# Patient Record
Sex: Male | Born: 1935 | Race: White | Hispanic: No | Marital: Married | Smoking: Never smoker
Health system: Southern US, Community
[De-identification: ages and names within clinical notes are randomized; demographics above are authoritative.]

## PROBLEM LIST (undated history)

## (undated) DIAGNOSIS — I6529 Occlusion and stenosis of unspecified carotid artery: Secondary | ICD-10-CM

## (undated) DIAGNOSIS — H903 Sensorineural hearing loss, bilateral: Secondary | ICD-10-CM

## (undated) DIAGNOSIS — I340 Nonrheumatic mitral (valve) insufficiency: Secondary | ICD-10-CM

## (undated) DIAGNOSIS — I2510 Atherosclerotic heart disease of native coronary artery without angina pectoris: Secondary | ICD-10-CM

## (undated) DIAGNOSIS — G3184 Mild cognitive impairment, so stated: Secondary | ICD-10-CM

## (undated) DIAGNOSIS — K219 Gastro-esophageal reflux disease without esophagitis: Secondary | ICD-10-CM

## (undated) DIAGNOSIS — E079 Disorder of thyroid, unspecified: Secondary | ICD-10-CM

## (undated) DIAGNOSIS — E785 Hyperlipidemia, unspecified: Secondary | ICD-10-CM

## (undated) DIAGNOSIS — N471 Phimosis: Secondary | ICD-10-CM

## (undated) DIAGNOSIS — E119 Type 2 diabetes mellitus without complications: Secondary | ICD-10-CM

## (undated) DIAGNOSIS — I774 Celiac artery compression syndrome: Secondary | ICD-10-CM

## (undated) DIAGNOSIS — K551 Chronic vascular disorders of intestine: Secondary | ICD-10-CM

## (undated) DIAGNOSIS — Z974 Presence of external hearing-aid: Secondary | ICD-10-CM

## (undated) DIAGNOSIS — I10 Essential (primary) hypertension: Secondary | ICD-10-CM

## (undated) DIAGNOSIS — E039 Hypothyroidism, unspecified: Secondary | ICD-10-CM

## (undated) DIAGNOSIS — I5189 Other ill-defined heart diseases: Secondary | ICD-10-CM

## (undated) DIAGNOSIS — I771 Stricture of artery: Secondary | ICD-10-CM

## (undated) DIAGNOSIS — M1990 Unspecified osteoarthritis, unspecified site: Secondary | ICD-10-CM

## (undated) DIAGNOSIS — R7303 Prediabetes: Secondary | ICD-10-CM

## (undated) DIAGNOSIS — R296 Repeated falls: Secondary | ICD-10-CM

## (undated) HISTORY — DX: Hyperlipidemia, unspecified: E78.5

## (undated) HISTORY — PX: CIRCUMCISION: SUR203

## (undated) HISTORY — DX: Unspecified osteoarthritis, unspecified site: M19.90

## (undated) HISTORY — PX: MRI: SHX5353

## (undated) HISTORY — PX: TONSILLECTOMY: SUR1361

---

## 1992-09-07 HISTORY — PX: OTHER SURGICAL HISTORY: SHX169

## 2002-02-16 DIAGNOSIS — I10 Essential (primary) hypertension: Secondary | ICD-10-CM | POA: Insufficient documentation

## 2004-09-07 DIAGNOSIS — Z86010 Personal history of colonic polyps: Secondary | ICD-10-CM | POA: Insufficient documentation

## 2004-09-17 ENCOUNTER — Ambulatory Visit: Payer: Self-pay

## 2009-11-14 ENCOUNTER — Ambulatory Visit: Payer: Self-pay | Admitting: Gastroenterology

## 2010-03-01 ENCOUNTER — Emergency Department: Payer: Self-pay | Admitting: Emergency Medicine

## 2010-03-03 ENCOUNTER — Other Ambulatory Visit: Payer: Self-pay | Admitting: Family Medicine

## 2010-03-04 ENCOUNTER — Ambulatory Visit: Payer: Self-pay | Admitting: Family Medicine

## 2010-03-12 ENCOUNTER — Ambulatory Visit: Payer: Self-pay | Admitting: Family Medicine

## 2011-07-24 LAB — BASIC METABOLIC PANEL
BUN: 16 mg/dL (ref 4–21)
Creatinine: 0.9 mg/dL (ref 0.6–1.3)
Glucose: 119 mg/dL
Potassium: 3.3 mmol/L — AB (ref 3.4–5.3)
SODIUM: 138 mmol/L (ref 137–147)

## 2011-11-09 LAB — LIPID PANEL
CHOLESTEROL: 160 mg/dL (ref 0–200)
HDL: 41 mg/dL (ref 35–70)
LDL CALC: 89 mg/dL
TRIGLYCERIDES: 151 mg/dL (ref 40–160)

## 2011-11-09 LAB — TSH: TSH: 2.99 u[IU]/mL (ref 0.41–5.90)

## 2011-11-09 LAB — HEPATIC FUNCTION PANEL
ALT: 38 U/L (ref 10–40)
AST: 30 U/L (ref 14–40)

## 2011-11-09 LAB — PSA: PSA: 1.5

## 2011-11-09 LAB — HEMOGLOBIN A1C: Hemoglobin A1C: 5.6

## 2013-01-19 DIAGNOSIS — E119 Type 2 diabetes mellitus without complications: Secondary | ICD-10-CM | POA: Insufficient documentation

## 2013-01-19 DIAGNOSIS — R7303 Prediabetes: Secondary | ICD-10-CM | POA: Insufficient documentation

## 2014-01-29 DIAGNOSIS — N471 Phimosis: Secondary | ICD-10-CM | POA: Insufficient documentation

## 2014-10-22 ENCOUNTER — Ambulatory Visit: Payer: Self-pay

## 2014-10-22 DIAGNOSIS — E118 Type 2 diabetes mellitus with unspecified complications: Secondary | ICD-10-CM | POA: Diagnosis not present

## 2014-10-22 DIAGNOSIS — J029 Acute pharyngitis, unspecified: Secondary | ICD-10-CM | POA: Diagnosis not present

## 2014-10-22 DIAGNOSIS — I10 Essential (primary) hypertension: Secondary | ICD-10-CM | POA: Diagnosis not present

## 2014-10-22 DIAGNOSIS — E039 Hypothyroidism, unspecified: Secondary | ICD-10-CM | POA: Diagnosis not present

## 2014-10-22 DIAGNOSIS — J329 Chronic sinusitis, unspecified: Secondary | ICD-10-CM | POA: Diagnosis not present

## 2014-10-22 DIAGNOSIS — J069 Acute upper respiratory infection, unspecified: Secondary | ICD-10-CM | POA: Diagnosis not present

## 2014-10-30 DIAGNOSIS — E039 Hypothyroidism, unspecified: Secondary | ICD-10-CM | POA: Diagnosis not present

## 2014-10-30 DIAGNOSIS — I10 Essential (primary) hypertension: Secondary | ICD-10-CM | POA: Diagnosis not present

## 2014-10-30 DIAGNOSIS — S29012A Strain of muscle and tendon of back wall of thorax, initial encounter: Secondary | ICD-10-CM | POA: Diagnosis not present

## 2014-10-30 DIAGNOSIS — E876 Hypokalemia: Secondary | ICD-10-CM | POA: Diagnosis not present

## 2014-11-27 DIAGNOSIS — E039 Hypothyroidism, unspecified: Secondary | ICD-10-CM | POA: Diagnosis not present

## 2014-11-27 DIAGNOSIS — N401 Enlarged prostate with lower urinary tract symptoms: Secondary | ICD-10-CM | POA: Diagnosis not present

## 2014-11-27 DIAGNOSIS — I10 Essential (primary) hypertension: Secondary | ICD-10-CM | POA: Diagnosis not present

## 2015-05-06 DIAGNOSIS — L237 Allergic contact dermatitis due to plants, except food: Secondary | ICD-10-CM | POA: Diagnosis not present

## 2015-05-06 DIAGNOSIS — B356 Tinea cruris: Secondary | ICD-10-CM | POA: Diagnosis not present

## 2015-06-22 ENCOUNTER — Ambulatory Visit
Admission: EM | Admit: 2015-06-22 | Discharge: 2015-06-22 | Disposition: A | Discharge status: Home / Self Care | Payer: Medicare Other | Attending: Family Medicine | Admitting: Family Medicine

## 2015-06-22 ENCOUNTER — Encounter: Payer: Self-pay | Admitting: Gynecology

## 2015-06-22 DIAGNOSIS — N410 Acute prostatitis: Secondary | ICD-10-CM | POA: Insufficient documentation

## 2015-06-22 DIAGNOSIS — E119 Type 2 diabetes mellitus without complications: Secondary | ICD-10-CM | POA: Insufficient documentation

## 2015-06-22 DIAGNOSIS — K219 Gastro-esophageal reflux disease without esophagitis: Secondary | ICD-10-CM | POA: Diagnosis not present

## 2015-06-22 DIAGNOSIS — Z79899 Other long term (current) drug therapy: Secondary | ICD-10-CM | POA: Insufficient documentation

## 2015-06-22 DIAGNOSIS — E079 Disorder of thyroid, unspecified: Secondary | ICD-10-CM | POA: Insufficient documentation

## 2015-06-22 DIAGNOSIS — R109 Unspecified abdominal pain: Secondary | ICD-10-CM | POA: Diagnosis present

## 2015-06-22 DIAGNOSIS — N471 Phimosis: Secondary | ICD-10-CM | POA: Insufficient documentation

## 2015-06-22 DIAGNOSIS — I10 Essential (primary) hypertension: Secondary | ICD-10-CM | POA: Insufficient documentation

## 2015-06-22 HISTORY — DX: Type 2 diabetes mellitus without complications: E11.9

## 2015-06-22 HISTORY — DX: Phimosis: N47.1

## 2015-06-22 HISTORY — DX: Gastro-esophageal reflux disease without esophagitis: K21.9

## 2015-06-22 HISTORY — DX: Disorder of thyroid, unspecified: E07.9

## 2015-06-22 HISTORY — DX: Essential (primary) hypertension: I10

## 2015-06-22 LAB — URINALYSIS COMPLETE WITH MICROSCOPIC (ARMC ONLY)
Bilirubin Urine: NEGATIVE
GLUCOSE, UA: NEGATIVE mg/dL
KETONES UR: NEGATIVE mg/dL
Leukocytes, UA: NEGATIVE
NITRITE: NEGATIVE
Protein, ur: NEGATIVE mg/dL
Specific Gravity, Urine: 1.02 (ref 1.005–1.030)
Squamous Epithelial / LPF: NONE SEEN — AB
pH: 6 (ref 5.0–8.0)

## 2015-06-22 MED ORDER — LEVOFLOXACIN 500 MG PO TABS: 500.0000 mg | ORAL_TABLET | Freq: Every day | ORAL | Status: DC

## 2015-06-22 NOTE — Discharge Instructions (Signed)

## 2015-06-22 NOTE — ED Notes (Signed)
Patient c/o question UTI or prostate infection. Patient stated abdominal pain x this am.

## 2015-06-22 NOTE — ED Provider Notes (Signed)
CSN: [id number]     Arrival date & time 06/22/15  1457 History   First MD Initiated Contact with Patient 06/22/15 1611     Chief Complaint  Patient presents with  . Abdominal Pain   (Consider location/radiation/quality/duration/timing/severity/associated sxs/prior Treatment) HPI Comments: 79 yo male presents with a c/o lower abdominal/pelvic pain intermittently since this morning. States in the past has had UTIs or prostatitis with similar presentation symptoms. Denies any fevers, chills, vomiting, diarrhea, dysuria, melena, hematochezia, hematuria.   The history is provided by the patient.    Past Medical History  Diagnosis Date  . Hypertension   . Diabetes mellitus without complication ([place])   . Thyroid disease   . GERD (gastroesophageal reflux disease)   . Phimosis    History reviewed. No pertinent past surgical history. No family history on file. Social History  Substance Use Topics  . Smoking status: Never Smoker   . Smokeless tobacco: Never Used  . Alcohol Use: No    Review of Systems  Allergies  Review of patient's allergies indicates no known allergies.  Home Medications   Prior to Admission medications   Medication Sig Start Date End Date Taking? Authorizing Provider  cetirizine (ZYRTEC) 10 MG tablet Take 10 mg by mouth daily.   Yes Historical Provider, MD  clotrimazole-betamethasone (LOTRISONE) cream Apply 1 application topically 2 (two) times daily.   Yes Historical Provider, MD  levothyroxine (SYNTHROID, LEVOTHROID) 75 MCG tablet Take 75 mcg by mouth daily before breakfast.   Yes Historical Provider, MD  losartan (COZAAR) 25 MG tablet Take 25 mg by mouth daily.   Yes Historical Provider, MD  omeprazole (PRILOSEC) 20 MG capsule Take 20 mg by mouth daily.   Yes Historical Provider, MD  tamsulosin (FLOMAX) 0.4 MG CAPS capsule Take 0.4 mg by mouth.   Yes Historical Provider, MD  triamterene-hydrochlorothiazide (MAXZIDE) 75-50 MG tablet Take 1 tablet by mouth  daily.   Yes Historical Provider, MD  levofloxacin (LEVAQUIN) 500 MG tablet Take 1 tablet (500 mg total) by mouth daily. 06/22/15   Norval Gable, MD   Meds Ordered and Administered this Visit  Medications - No data to display  BP 162/78 mmHg  Pulse 60  Temp(Src) 96.4 F (35.8 C) (Tympanic)  Resp 18  Ht 5\' 9"  (1.753 m)  Wt 200 lb (90.719 kg)  BMI 29.52 kg/m2  SpO2 99% No data found.   Physical Exam  Constitutional: He appears well-developed and well-nourished. No distress.  Cardiovascular: Normal rate.   Pulmonary/Chest: Effort normal and breath sounds normal. No respiratory distress.  Abdominal: Soft. Bowel sounds are normal. He exhibits no distension and no mass. There is no tenderness. There is no rebound and no guarding. Hernia confirmed negative in the right inguinal area and confirmed negative in the left inguinal area.  Genitourinary: Prostate is enlarged and tender.  Lymphadenopathy:       Right: No inguinal adenopathy present.       Left: No inguinal adenopathy present.  Skin: He is not diaphoretic.  Nursing note and vitals reviewed.   ED Course  Procedures (including critical care time)  Labs Review Labs Reviewed  URINALYSIS COMPLETEWITH MICROSCOPIC ([place]) - Abnormal; Notable for the following:    Hgb urine dipstick 2+ (*)    Squamous Epithelial / LPF NONE SEEN (*)    All other components within normal limits  URINE CULTURE    Imaging Review No results found.   Visual Acuity Review  Right Eye Distance:   Left Eye  Distance:   Bilateral Distance:    Right Eye Near:   Left Eye Near:    Bilateral Near:         MDM   1. Acute prostatitis    Discharge Medication List as of 06/22/2015  5:12 PM    START taking these medications   Details  levofloxacin (LEVAQUIN) 500 MG tablet Take 1 tablet (500 mg total) by mouth daily., Starting 06/22/2015, Until Discontinued, Print      1. Lab results (negative UA) and diagnosis reviewed with  patient 2. rx as per orders above; reviewed possible side effects, interactions, risks and benefits  3. Recommend supportive treatment with otc analgesics prn  4. Follow-up prn if symptoms worsen or don't improve    Norval Gable, MD 06/28/15 1411

## 2015-06-24 LAB — URINE CULTURE: CULTURE: NO GROWTH

## 2015-06-26 DIAGNOSIS — N401 Enlarged prostate with lower urinary tract symptoms: Secondary | ICD-10-CM | POA: Diagnosis not present

## 2015-06-26 DIAGNOSIS — Z23 Encounter for immunization: Secondary | ICD-10-CM | POA: Diagnosis not present

## 2015-06-26 DIAGNOSIS — E876 Hypokalemia: Secondary | ICD-10-CM | POA: Diagnosis not present

## 2015-06-26 DIAGNOSIS — R739 Hyperglycemia, unspecified: Secondary | ICD-10-CM | POA: Diagnosis not present

## 2015-06-26 DIAGNOSIS — E039 Hypothyroidism, unspecified: Secondary | ICD-10-CM | POA: Diagnosis not present

## 2015-06-26 DIAGNOSIS — R319 Hematuria, unspecified: Secondary | ICD-10-CM | POA: Diagnosis not present

## 2015-06-27 DIAGNOSIS — Z23 Encounter for immunization: Secondary | ICD-10-CM | POA: Diagnosis not present

## 2015-09-03 DIAGNOSIS — H8110 Benign paroxysmal vertigo, unspecified ear: Secondary | ICD-10-CM | POA: Diagnosis not present

## 2015-09-03 DIAGNOSIS — N410 Acute prostatitis: Secondary | ICD-10-CM | POA: Diagnosis not present

## 2015-09-11 DIAGNOSIS — H2513 Age-related nuclear cataract, bilateral: Secondary | ICD-10-CM | POA: Diagnosis not present

## 2015-09-23 DIAGNOSIS — L821 Other seborrheic keratosis: Secondary | ICD-10-CM | POA: Diagnosis not present

## 2015-11-19 ENCOUNTER — Telehealth: Payer: Self-pay | Admitting: Family Medicine

## 2015-11-20 ENCOUNTER — Emergency Department
Admission: EM | Admit: 2015-11-20 | Discharge: 2015-11-20 | Disposition: A | Discharge status: Home / Self Care | Payer: Medicare Other | Attending: Emergency Medicine | Admitting: Emergency Medicine

## 2015-11-20 ENCOUNTER — Emergency Department: Payer: Medicare Other

## 2015-11-20 DIAGNOSIS — Z7982 Long term (current) use of aspirin: Secondary | ICD-10-CM | POA: Diagnosis not present

## 2015-11-20 DIAGNOSIS — Z79899 Other long term (current) drug therapy: Secondary | ICD-10-CM | POA: Insufficient documentation

## 2015-11-20 DIAGNOSIS — R945 Abnormal results of liver function studies: Secondary | ICD-10-CM | POA: Insufficient documentation

## 2015-11-20 DIAGNOSIS — E119 Type 2 diabetes mellitus without complications: Secondary | ICD-10-CM | POA: Diagnosis not present

## 2015-11-20 DIAGNOSIS — Z792 Long term (current) use of antibiotics: Secondary | ICD-10-CM | POA: Diagnosis not present

## 2015-11-20 DIAGNOSIS — J159 Unspecified bacterial pneumonia: Secondary | ICD-10-CM | POA: Diagnosis not present

## 2015-11-20 DIAGNOSIS — J189 Pneumonia, unspecified organism: Secondary | ICD-10-CM

## 2015-11-20 DIAGNOSIS — I10 Essential (primary) hypertension: Secondary | ICD-10-CM | POA: Diagnosis not present

## 2015-11-20 DIAGNOSIS — R079 Chest pain, unspecified: Secondary | ICD-10-CM | POA: Diagnosis not present

## 2015-11-20 DIAGNOSIS — R0602 Shortness of breath: Secondary | ICD-10-CM | POA: Diagnosis not present

## 2015-11-20 DIAGNOSIS — R200 Anesthesia of skin: Secondary | ICD-10-CM | POA: Insufficient documentation

## 2015-11-20 DIAGNOSIS — R209 Unspecified disturbances of skin sensation: Secondary | ICD-10-CM | POA: Diagnosis not present

## 2015-11-20 DIAGNOSIS — R0789 Other chest pain: Secondary | ICD-10-CM | POA: Diagnosis not present

## 2015-11-20 LAB — TROPONIN I

## 2015-11-20 LAB — BASIC METABOLIC PANEL
ANION GAP: 7 (ref 5–15)
BUN: 15 mg/dL (ref 6–20)
CO2: 27 mmol/L (ref 22–32)
Calcium: 9.1 mg/dL (ref 8.9–10.3)
Chloride: 101 mmol/L (ref 101–111)
Creatinine, Ser: 0.9 mg/dL (ref 0.61–1.24)
GLUCOSE: 110 mg/dL — AB (ref 65–99)
POTASSIUM: 3.3 mmol/L — AB (ref 3.5–5.1)
Sodium: 135 mmol/L (ref 135–145)

## 2015-11-20 LAB — CBC
HEMATOCRIT: 44.4 % (ref 40.0–52.0)
HEMOGLOBIN: 15.1 g/dL (ref 13.0–18.0)
MCH: 30.1 pg (ref 26.0–34.0)
MCHC: 34 g/dL (ref 32.0–36.0)
MCV: 88.4 fL (ref 80.0–100.0)
Platelets: 272 10*3/uL (ref 150–440)
RBC: 5.03 MIL/uL (ref 4.40–5.90)
RDW: 13.6 % (ref 11.5–14.5)
WBC: 7.9 10*3/uL (ref 3.8–10.6)

## 2015-11-20 MED ORDER — LEVOFLOXACIN 750 MG PO TABS: 750.0000 mg | ORAL_TABLET | Freq: Every day | ORAL | Status: AC

## 2015-11-20 NOTE — ED Notes (Signed)
Pt back from CT. Up to bedside commode to urinate.

## 2015-11-20 NOTE — ED Notes (Signed)
Patient transported to CT 

## 2015-11-20 NOTE — ED Provider Notes (Signed)
[facility] Emergency Department Provider Note    ____________________________________________  Time seen: ~1240  I have reviewed the triage vital signs and the nursing notes.   HISTORY  Chief Complaint Chest Pain   History limited by: Not Limited   HPI Ernest Herschell Jamarquis Rommel. is a 80 y.o. male who presents to the emergency department today because of concern for chest pain. Patient states that it started this morning around 530am. He describes it as being located centrally. He describes it as being pressure like. He did have some associated left arm/hand tingling. He denies any associated shortness of breath or diaphoresis. The patient had a similar episode of pain about a week and a half ago while at church. He did not have the numbness with the pain at that time but did have a little difficulty with speech. No recent fevers. No recent nausea or vomiting.   Past Medical History  Diagnosis Date  . Hypertension   . Diabetes mellitus without complication ([place])   . Thyroid disease   . GERD (gastroesophageal reflux disease)   . Phimosis     Patient Active Problem List   Diagnosis Date Noted  . Abnormal results of liver function studies 11/20/2015  . Abnormal liver enzymes 03/24/2009  . Pure hyperglyceridemia 03/20/2009  . Hypothyroidism 09/18/2008  . History of colon polyps 09/07/2004  . Benign essential HTN 02/16/2002  . Esophagitis, reflux 02/16/2002    Past Surgical History  Procedure Laterality Date  . Sinus surgery  1994    on left side    Current Outpatient Rx  Name  Route  Sig  Dispense  Refill  . aspirin 81 MG tablet   Oral   Take 1 tablet by mouth daily.         Marland Kitchen levothyroxine (SYNTHROID, LEVOTHROID) 75 MCG tablet   Oral   Take 75 mcg by mouth daily before breakfast.         . losartan (COZAAR) 25 MG tablet   Oral   Take 25 mg by mouth daily.         Marland Kitchen omeprazole (PRILOSEC) 20 MG capsule   Oral   Take 20 mg by  mouth daily.         . tamsulosin (FLOMAX) 0.4 MG CAPS capsule   Oral   Take 0.4 mg by mouth.         . triamterene-hydrochlorothiazide (MAXZIDE) 75-50 MG tablet   Oral   Take 1 tablet by mouth daily.         . cetirizine (ZYRTEC) 10 MG tablet   Oral   Take 10 mg by mouth daily.         . clotrimazole-betamethasone (LOTRISONE) cream   Topical   Apply 1 application topically 2 (two) times daily.         Marland Kitchen levofloxacin (LEVAQUIN) 500 MG tablet   Oral   Take 1 tablet (500 mg total) by mouth daily.   14 tablet   0   . Multiple Vitamins-Minerals (MULTIVITAMIN ADULT PO)   Oral   Take 1 tablet by mouth daily.           Allergies Review of patient's allergies indicates no known allergies.  Family History  Problem Relation Age of Onset  . Alzheimer's disease Mother   . CAD Father   . Diabetes Sister     type 2    Social History Social History  Substance Use Topics  . Smoking status: Never Smoker   .  Smokeless tobacco: Never Used  . Alcohol Use: No    Review of Systems  Constitutional: Negative for fever. Cardiovascular: Positive for chest pain. Respiratory: Negative for shortness of breath. Gastrointestinal: Negative for abdominal pain, vomiting and diarrhea. Neurological: Negative for headaches, focal weakness or numbness.  10-point ROS otherwise negative.  ____________________________________________   PHYSICAL EXAM:  VITAL SIGNS: ED Triage Vitals  Enc Vitals Group     BP 11/20/15 1202 166/77 mmHg     Pulse Rate 11/20/15 1202 51     Resp 11/20/15 1202 16     Temp 11/20/15 1202 98.1 F (36.7 C)     Temp Source 11/20/15 1202 Oral     SpO2 11/20/15 1202 97 %     Weight 11/20/15 1202 200 lb (90.719 kg)     Height 11/20/15 1202 5\' 10"  (1.778 m)     Head Cir --      Peak Flow --      Pain Score 11/20/15 1159 5   Constitutional: Alert and oriented. Well appearing and in no distress. Eyes: Conjunctivae are normal. PERRL. Normal  extraocular movements. ENT   Head: Normocephalic and atraumatic.   Nose: No congestion/rhinnorhea.   Mouth/Throat: Mucous membranes are moist.   Neck: No stridor. Hematological/Lymphatic/Immunilogical: No cervical lymphadenopathy. Cardiovascular: Normal rate, regular rhythm.  No murmurs, rubs, or gallops. Respiratory: Normal respiratory effort without tachypnea nor retractions. Breath sounds are clear and equal bilaterally. No wheezes/rales/rhonchi. Gastrointestinal: Soft and nontender. No distention. There is no CVA tenderness. Genitourinary: Deferred Musculoskeletal: Normal range of motion in all extremities. No joint effusions.  No lower extremity tenderness nor edema. Neurologic:  Normal speech and language. No gross focal neurologic deficits are appreciated.  Skin:  Skin is warm, dry and intact. No rash noted. Psychiatric: Mood and affect are normal. Speech and behavior are normal. Patient exhibits appropriate insight and judgment.  ____________________________________________    LABS (pertinent positives/negatives)  Labs Reviewed  BASIC METABOLIC PANEL - Abnormal; Notable for the following:    Potassium 3.3 (*)    Glucose, Bld 110 (*)    All other components within normal limits  CBC  TROPONIN I  TROPONIN I     ____________________________________________   EKG  I, Nance Pear, attending physician, personally viewed and interpreted this EKG  EKG Time: 1200 Rate: 56 Rhythm: sinus bradycardia Axis: left axis deviation Intervals: qtc 417 QRS: narrow, q waves V1 ST changes: no st elevation Impression: abnormal ekg  ____________________________________________    RADIOLOGY  CXR IMPRESSION: Lingular airspace disease which may reflect atelectasis versus Pneumonia.  CT head IMPRESSION: No acute abnormality. ____________________________________________   PROCEDURES  Procedure(s) performed: None  Critical Care performed:  No  ____________________________________________   INITIAL IMPRESSION / ASSESSMENT AND PLAN / ED COURSE  Pertinent labs & imaging results that were available during my care of the patient were reviewed by me and considered in my medical decision making (see chart for details).  Patient presented to the emergency department today because of concerns for chest pain. Patient did have 2 sets of troponin which were both negative. In addition chest x-ray showed possible pneumonia. Furthermore patient did get a head CT which was negative. This point will treat for pneumonia. Will have patient follow-up with primary care.  ____________________________________________   FINAL CLINICAL IMPRESSION(S) / ED DIAGNOSES  Final diagnoses:  None     Nance Pear, MD 11/20/15 [phone]

## 2015-11-20 NOTE — ED Notes (Addendum)
PT arrives to ER c/o chest pressure and numbness to left arm upon awakening this AM. Pt state that similar episode happened X 1.5 weeks ago but also had a hard time getting words out. Pt does not have any trouble speaking in this event. Hypertensive with EMS. No motor deficits present at this time. Pt alert and oriented X4, active, cooperative, pt in NAD. RR even and unlabored, color WNL.  Pt reports "slight" numbness to left arm at this time, full ROM.

## 2015-11-20 NOTE — ED Notes (Signed)
Patient transported to X-ray 

## 2015-11-20 NOTE — Discharge Instructions (Signed)
Please seek medical attention for any high fevers, chest pain, shortness of breath, change in behavior, persistent vomiting, bloody stool or any other new or concerning symptoms. ° °Community-Acquired Pneumonia, Adult °Pneumonia is an infection of the lungs. There are different types of pneumonia. One type can develop while a person is in a hospital. A different type, called community-acquired pneumonia, develops in people who are not, or have not recently been, in the hospital or other health care facility.  °CAUSES °Pneumonia may be caused by bacteria, viruses, or funguses. Community-acquired pneumonia is often caused by Streptococcus pneumonia bacteria. These bacteria are often passed from one person to another by breathing in droplets from the cough or sneeze of an infected person. °RISK FACTORS °The condition is more likely to develop in: °· People who have chronic diseases, such as chronic obstructive pulmonary disease (COPD), asthma, congestive heart failure, cystic fibrosis, diabetes, or kidney disease. °· People who have early-stage or late-stage HIV. °· People who have sickle cell disease. °· People who have had their spleen removed (splenectomy). °· People who have poor dental hygiene. °· People who have medical conditions that increase the risk of breathing in (aspirating) secretions their own mouth and nose.   °· People who have a weakened immune system (immunocompromised). °· People who smoke. °· People who travel to areas where pneumonia-causing germs commonly exist. °· People who are around animal habitats or animals that have pneumonia-causing germs, including birds, bats, rabbits, cats, and farm animals. °SYMPTOMS °Symptoms of this condition include: °· A dry cough. °· A wet (productive) cough. °· Fever. °· Sweating. °· Chest pain, especially when breathing deeply or coughing. °· Rapid breathing or difficulty breathing. °· Shortness of breath. °· Shaking chills. °· Fatigue. °· Muscle  aches. °DIAGNOSIS °Your health care provider will take a medical history and perform a physical exam. You may also have other tests, including: °· Imaging studies of your chest, including X-rays. °· Tests to check your blood oxygen level and other blood gases. °· Other tests on blood, mucus (sputum), fluid around your lungs (pleural fluid), and urine. °If your pneumonia is severe, other tests may be done to identify the specific cause of your illness. °TREATMENT °The type of treatment that you receive depends on many factors, such as the cause of your pneumonia, the medicines you take, and other medical conditions that you have. For most adults, treatment and recovery from pneumonia may occur at home. In some cases, treatment must happen in a hospital. Treatment may include: °· Antibiotic medicines, if the pneumonia was caused by bacteria. °· Antiviral medicines, if the pneumonia was caused by a virus. °· Medicines that are given by mouth or through an IV tube. °· Oxygen. °· Respiratory therapy. °Although rare, treating severe pneumonia may include: °· Mechanical ventilation. This is done if you are not breathing well on your own and you cannot maintain a safe blood oxygen level. °· Thoracentesis. This procedure removes fluid around one lung or both lungs to help you breathe better. °HOME CARE INSTRUCTIONS °· Take over-the-counter and prescription medicines only as told by your health care provider. °¨ Only take cough medicine if you are losing sleep. Understand that cough medicine can prevent your body's natural ability to remove mucus from your lungs. °¨ If you were prescribed an antibiotic medicine, take it as told by your health care provider. Do not stop taking the antibiotic even if you start to feel better. °· Sleep in a semi-upright position at night. Try sleeping in a reclining chair, or place a few pillows under your head. °· Do   not use tobacco products, including cigarettes, chewing tobacco, and  e-cigarettes. If you need help quitting, ask your health care provider. °· Drink enough water to keep your urine clear or pale yellow. This will help to thin out mucus secretions in your lungs. °PREVENTION °There are ways that you can decrease your risk of developing community-acquired pneumonia. Consider getting a pneumococcal vaccine if: °· You are older than 78 years of age. °· You are older than 78 years of age and are undergoing cancer treatment, have chronic lung disease, or have other medical conditions that affect your immune system. Ask your health care provider if this applies to you. °There are different types and schedules of pneumococcal vaccines. Ask your health care provider which vaccination option is best for you. °You may also prevent community-acquired pneumonia if you take these actions: °· Get an influenza vaccine every year. Ask your health care provider which type of influenza vaccine is best for you. °· Go to the dentist on a regular basis. °· Wash your hands often. Use hand sanitizer if soap and water are not available. °SEEK MEDICAL CARE IF: °· You have a fever. °· You are losing sleep because you cannot control your cough with cough medicine. °SEEK IMMEDIATE MEDICAL CARE IF: °· You have worsening shortness of breath. °· You have increased chest pain. °· Your sickness becomes worse, especially if you are an older adult or have a weakened immune system. °· You cough up blood. °  °This information is not intended to replace advice given to you by your health care provider. Make sure you discuss any questions you have with your health care provider. °  °Document Released: 08/24/2005 Document Revised: 05/15/2015 Document Reviewed: 12/19/2014 °Elsevier Interactive Patient Education ©2016 [employer] ° °

## 2015-11-20 NOTE — ED Notes (Signed)
Pt up to bathroom.

## 2015-11-20 NOTE — ED Notes (Signed)
Pt alert and oriented X4, active, cooperative, pt in NAD. RR even and unlabored, color WNL.  Pt informed to return if any life threatening symptoms occur.   

## 2015-11-20 NOTE — ED Notes (Signed)
Pt reports that he took 2 324 ASA PTA

## 2015-11-21 ENCOUNTER — Ambulatory Visit: Payer: Self-pay | Admitting: Family Medicine

## 2015-11-22 ENCOUNTER — Ambulatory Visit (INDEPENDENT_AMBULATORY_CARE_PROVIDER_SITE_OTHER): Payer: Medicare Other | Admitting: Family Medicine

## 2015-11-22 ENCOUNTER — Encounter: Payer: Self-pay | Admitting: Family Medicine

## 2015-11-22 VITALS — BP 120/64 | HR 70 | Temp 97.8°F | Resp 16 | Ht 68.5 in | Wt 204.0 lb

## 2015-11-22 DIAGNOSIS — I10 Essential (primary) hypertension: Secondary | ICD-10-CM

## 2015-11-22 DIAGNOSIS — R079 Chest pain, unspecified: Secondary | ICD-10-CM | POA: Diagnosis not present

## 2015-11-22 DIAGNOSIS — E781 Pure hyperglyceridemia: Secondary | ICD-10-CM

## 2015-11-22 DIAGNOSIS — R739 Hyperglycemia, unspecified: Secondary | ICD-10-CM

## 2015-11-22 DIAGNOSIS — Z125 Encounter for screening for malignant neoplasm of prostate: Secondary | ICD-10-CM | POA: Diagnosis not present

## 2015-11-22 DIAGNOSIS — G459 Transient cerebral ischemic attack, unspecified: Secondary | ICD-10-CM

## 2015-11-22 DIAGNOSIS — Z8249 Family history of ischemic heart disease and other diseases of the circulatory system: Secondary | ICD-10-CM | POA: Insufficient documentation

## 2015-11-22 DIAGNOSIS — E039 Hypothyroidism, unspecified: Secondary | ICD-10-CM

## 2015-11-22 DIAGNOSIS — Z0000 Encounter for general adult medical examination without abnormal findings: Secondary | ICD-10-CM | POA: Diagnosis not present

## 2015-11-22 DIAGNOSIS — J302 Other seasonal allergic rhinitis: Secondary | ICD-10-CM | POA: Insufficient documentation

## 2015-11-22 NOTE — Progress Notes (Signed)
Patient: Ernest James., Male    DOB: 24-Jan-1936, 80 y.o.   MRN: [id number] Visit Date: 11/22/2015  Today's Provider: Lelon Huh, MD   Chief Complaint  Patient presents with  . Medicare Wellness  . Hypertension  . Hyperlipidemia  . Hypothyroidism   Subjective:    Annual wellness visit [facility] [name]. is a 80 y.o. male. He feels well. He reports exercising yes/ some walking. He reports he is sleeping fairly well.  -----------------------------------------------------------  Establish care  He is transferring from [facility] where he was followed for  Follow-up for hypothyroidism and abnormal liver enzymesfrom 11/09/2011; labs checked, no changes were made.     Hypertension, follow-up:  BP Readings from Last 3 Encounters:  11/22/15 120/64  11/20/15 175/70  11/09/11 138/72    He was last seen for hypertension 11/09/2011.  BP at that visit was 138/72. Management since that visit includes; no changes.He reports good compliance with treatment. He is not having side effects. none  He is exercising. He is adherent to low salt diet.   Outside blood pressures are no. He is experiencing chest pain.  Patient denies none.   Cardiovascular risk factors include none.  Use of agents associated with hypertension: none.   ----------------------------------------------------------------------    Lipid/Cholesterol, Follow-up:   Last seen for this 11/09/2011.  Management since that visit includes; labs checked, no changes.  Last Lipid Panel:    Component Value Date/Time   CHOL 160 11/09/2011   TRIG 151 11/09/2011   HDL 41 11/09/2011   LDLCALC 89 11/09/2011    He reports good compliance with treatment. He is not having side effects. none  Wt Readings from Last 3 Encounters:  11/22/15 204 lb (92.534 kg)  11/20/15 200 lb (90.719 kg)  11/09/11 196 lb (88.905 kg)     ----------------------------------------------------------------------  Aphasia He described episode at church about 2 weeks ago where he had sudden onset of speech difficulty which he described as having difficulty formulating spoken words. It last about 10 minutes and resolved spontaneously.  Chest pain He presented to ER earlier this week with vague non-exertional chest pain associated with numbness in his left arm. Cardiac enzymes were repeatedly normal, and EKG was normal, but his had of possible pneumonia on xray and was treated with Levaquin. He has had no more episodes of chest pain or arm numbness since then.   Review of Systems  Constitutional: Negative.   HENT: Negative.   Eyes: Negative.   Respiratory: Negative.   Cardiovascular: Positive for chest pain.       Patient went to ER 11/20/2015 with chest pain and elevated Bp  Gastrointestinal: Negative.   Endocrine: Negative.   Genitourinary: Negative.   Musculoskeletal: Negative.   Skin: Negative.   Allergic/Immunologic: Negative.   Neurological: Negative.   Hematological: Negative.   Psychiatric/Behavioral: Negative.     Social History   Social History  . Marital Status: Married    Spouse Name: N/A  . Number of Children: N/A  . Years of Education: N/A   Occupational History  . [employer]    Social History Main Topics  . Smoking status: Never Smoker   . Smokeless tobacco: Never Used  . Alcohol Use: No  . Drug Use: No  . Sexual Activity: Not on file   Other Topics Concern  . Not on file   Social History Narrative    Past Medical History  Diagnosis Date  . Hypertension   .  Diabetes mellitus without complication ([place])   . Thyroid disease   . GERD (gastroesophageal reflux disease)   . Phimosis      Patient Active Problem List   Diagnosis Date Noted  . Allergic rhinitis, seasonal 11/22/2015  . Abnormal results of liver function studies 11/20/2015  . Phimosis 01/29/2014  . Hyperglycemia 01/19/2013   . Abnormal liver enzymes 03/24/2009  . Pure hyperglyceridemia 03/20/2009  . Hypothyroidism 09/18/2008  . History of colon polyps 09/07/2004  . Benign essential HTN 02/16/2002  . Esophagitis, reflux 02/16/2002    Past Surgical History  Procedure Laterality Date  . Sinus surgery  1994    on left side    His family history includes Alzheimer's disease in his mother; CAD in his father; Diabetes in his sister.    Previous Medications   ASPIRIN EC 81 MG TABLET    Take 81 mg by mouth daily.   CETIRIZINE (ZYRTEC) 10 MG TABLET    Take 10 mg by mouth daily.   CLOTRIMAZOLE-BETAMETHASONE (LOTRISONE) CREAM    Apply 1 application topically 2 (two) times daily.   LEVOFLOXACIN (LEVAQUIN) 750 MG TABLET    Take 1 tablet (750 mg total) by mouth daily.   LEVOTHYROXINE (SYNTHROID, LEVOTHROID) 75 MCG TABLET    Take 75 mcg by mouth daily before breakfast.   LOSARTAN (COZAAR) 25 MG TABLET    Take 25 mg by mouth daily.   MULTIPLE VITAMINS-MINERALS (MULTIVITAMIN ADULT PO)    Take 1 tablet by mouth daily.   OMEPRAZOLE (PRILOSEC) 20 MG CAPSULE    Take 20 mg by mouth daily.   TAMSULOSIN (FLOMAX) 0.4 MG CAPS CAPSULE    Take 0.4 mg by mouth daily after supper.    TRIAMTERENE-HYDROCHLOROTHIAZIDE (MAXZIDE) 75-50 MG TABLET    Take 1 tablet by mouth daily.    Patient Care Team: No Pcp Per Patient as PCP - General (General Practice)     Objective:   Vitals: BP 120/64 mmHg  Pulse 70  Temp(Src) 97.8 F (36.6 C) (Oral)  Resp 16  Ht 5' 8.5" (1.74 m)  Wt 204 lb (92.534 kg)  BMI 30.56 kg/m2  SpO2 96%  Physical Exam  Activities of Daily Living In your present state of health, do you have any difficulty performing the following activities: 11/22/2015  Hearing? N  Vision? N  Difficulty concentrating or making decisions? N  Walking or climbing stairs? N  Dressing or bathing? N  Doing errands, shopping? N    Fall Risk Assessment Fall Risk  11/22/2015  Falls in the past year? No     Depression  Screen PHQ 2/9 Scores 11/22/2015  PHQ - 2 Score 0  PHQ- 9 Score 1    Cognitive Testing - 6-CIT  Correct? Score   What year is it? yes 0 0 or 4  What month is it? yes 0 0 or 3  Memorize:    Ernest James,  44,  [place],      What time is it? (within 1 hour) yes 0 0 or 3  Count backwards from 20 yes 0 0, 2, or 4  Name the months of the year yes 0 0, 2, or 4  Repeat name & address above yes 0 0, 2, 4, 6, 8, or 10       TOTAL SCORE  0/28   Interpretation:  Normal  Normal (0-7) Abnormal (8-28)    Audit-C Alcohol Use Screening  Question Answer Points  How often do you have alcoholic  drink? never 0  On days you do drink alcohol, how many drinks do you typically consume? n/a 0  How oftey will you drink 6 or more in a total? never 0  Total Score:  0   A score of 3 or more in women, and 4 or more in men indicates increased risk for alcohol abuse, EXCEPT if all of the points are from question 1.    Assessment & Plan:     Annual Wellness Visit  Reviewed patient's Family Medical History Reviewed and updated list of patient's medical providers Assessment of cognitive impairment was done Assessed patient's functional ability Established a written schedule for health screening Star Completed and Reviewed  Exercise Activities and Dietary recommendations Goals    None      Immunization History  Administered Date(s) Administered  . Influenza, High Dose Seasonal PF 06/27/2015  . Pneumococcal Conjugate-13 06/27/2015  . Pneumococcal Polysaccharide-23 06/30/2007  . Td 06/07/2001  . Tdap 06/18/2014  . Zoster 02/16/2012    Health Maintenance  Topic Date Due  . PNA vac Low Risk Adult (2 of 2 - PCV13) 06/29/2008  . TETANUS/TDAP  06/08/2011  . INFLUENZA VACCINE  04/08/2015  . ZOSTAVAX  Completed      Discussed health benefits of physical activity, and encouraged him to engage in regular exercise appropriate for his age and condition.     --------------------------------------------------------------------------------   1. Medicare annual wellness visit, subsequent   2. Benign essential HTN Well controlled.  Continue current medications.    3. Hyperglycemia  - Hemoglobin A1c  4. Hypothyroidism, unspecified hypothyroidism type Check TSH  5. Pure hyperglyceridemia Diet controlled.  - Lipid panel  6. Transient cerebral ischemia, unspecified transient cerebral ischemia type Episode of speech impairment.  - US Carotid Duplex Bilateral; Future  7. Chest pain, unspecified chest pain type Has family history of CAD but has never had stress test.  - Myocardial Perfusion Imaging; Future  8. Family history of early CAD He is to stay on 81mg  ECASA daily which had stopped until the last few days.   9. Prostate cancer screening  - PSA

## 2015-11-22 NOTE — Patient Instructions (Signed)
Please contact the GI department at [facility] for follow up colonoscopy

## 2015-11-23 ENCOUNTER — Encounter: Payer: Self-pay | Admitting: Family Medicine

## 2015-11-23 LAB — HEMOGLOBIN A1C
Est. average glucose Bld gHb Est-mCnc: 131 mg/dL
Hgb A1c MFr Bld: 6.2 % — ABNORMAL HIGH (ref 4.8–5.6)

## 2015-11-23 LAB — PSA: PROSTATE SPECIFIC AG, SERUM: 1.9 ng/mL (ref 0.0–4.0)

## 2015-11-23 LAB — LIPID PANEL
Chol/HDL Ratio: 3.9 ratio units (ref 0.0–5.0)
Cholesterol, Total: 167 mg/dL (ref 100–199)
HDL: 43 mg/dL (ref >39)
LDL Calculated: 85 mg/dL (ref 0–99)
TRIGLYCERIDES: 195 mg/dL — AB (ref 0–149)
VLDL Cholesterol Cal: 39 mg/dL (ref 5–40)

## 2015-11-26 LAB — SPECIMEN STATUS REPORT

## 2015-11-26 LAB — TSH: TSH: 6.24 u[IU]/mL — AB (ref 0.450–4.500)

## 2015-11-27 ENCOUNTER — Other Ambulatory Visit: Payer: Self-pay | Admitting: Family Medicine

## 2015-11-27 ENCOUNTER — Ambulatory Visit
Admission: RE | Admit: 2015-11-27 | Discharge: 2015-11-27 | Disposition: A | Discharge status: Home / Self Care | Payer: Medicare Other | Source: Ambulatory Visit | Attending: Family Medicine | Admitting: Family Medicine

## 2015-11-27 DIAGNOSIS — G459 Transient cerebral ischemic attack, unspecified: Secondary | ICD-10-CM | POA: Insufficient documentation

## 2015-11-27 DIAGNOSIS — I6523 Occlusion and stenosis of bilateral carotid arteries: Secondary | ICD-10-CM | POA: Insufficient documentation

## 2015-11-27 DIAGNOSIS — I6522 Occlusion and stenosis of left carotid artery: Secondary | ICD-10-CM

## 2015-11-27 DIAGNOSIS — I6529 Occlusion and stenosis of unspecified carotid artery: Secondary | ICD-10-CM | POA: Insufficient documentation

## 2015-12-02 ENCOUNTER — Telehealth: Payer: Self-pay | Admitting: Family Medicine

## 2015-12-02 DIAGNOSIS — I6522 Occlusion and stenosis of left carotid artery: Secondary | ICD-10-CM

## 2015-12-02 DIAGNOSIS — R0789 Other chest pain: Secondary | ICD-10-CM

## 2015-12-02 DIAGNOSIS — G459 Transient cerebral ischemic attack, unspecified: Secondary | ICD-10-CM

## 2015-12-02 NOTE — Telephone Encounter (Signed)
Ordered test as below. Please fill in hardstops. Thanks!

## 2015-12-02 NOTE — Telephone Encounter (Signed)
Per [facility] the order for stress test has been put in incorrectly.Test needs to be [id number]

## 2015-12-09 DIAGNOSIS — I10 Essential (primary) hypertension: Secondary | ICD-10-CM | POA: Diagnosis not present

## 2015-12-09 DIAGNOSIS — I6523 Occlusion and stenosis of bilateral carotid arteries: Secondary | ICD-10-CM | POA: Diagnosis not present

## 2015-12-09 DIAGNOSIS — E119 Type 2 diabetes mellitus without complications: Secondary | ICD-10-CM | POA: Diagnosis not present

## 2015-12-10 ENCOUNTER — Encounter
Admission: RE | Admit: 2015-12-10 | Discharge: 2015-12-10 | Disposition: A | Discharge status: Home / Self Care | Payer: Medicare Other | Source: Ambulatory Visit | Attending: Family Medicine | Admitting: Family Medicine

## 2015-12-10 DIAGNOSIS — R0789 Other chest pain: Secondary | ICD-10-CM | POA: Insufficient documentation

## 2015-12-10 LAB — NM MYOCAR MULTI W/SPECT W/WALL MOTION / EF
CHL CUP RESTING HR STRESS: 63 {beats}/min
CHL CUP STRESS STAGE 1 HR: 72 {beats}/min
CHL CUP STRESS STAGE 3 SPEED: 0.1 mph
CHL CUP STRESS STAGE 5 GRADE: 0 %
CHL CUP STRESS STAGE 5 HR: 103 {beats}/min
CHL CUP STRESS STAGE 6 DBP: 81 mmHg
CHL CUP STRESS STAGE 6 SBP: 182 mmHg
CSEPED: 2 min
CSEPPHR: 139 {beats}/min
Estimated workload: 4.6 METS
Exercise duration (sec): 25 s
LV dias vol: 84 mL (ref 62–150)
LVSYSVOL: 26 mL
NUC STRESS TID: 0.89
Percent of predicted max HR: 97 %
SDS: 2
SRS: 1
SSS: 2
Stage 2 Grade: 0 %
Stage 2 HR: 65 {beats}/min
Stage 2 Speed: 0.1 mph
Stage 3 Grade: 0 %
Stage 3 HR: 65 {beats}/min
Stage 4 Grade: 10 %
Stage 4 HR: 139 {beats}/min
Stage 4 Speed: 1.7 mph
Stage 5 Speed: 0 mph
Stage 6 Grade: 0 %
Stage 6 HR: 80 {beats}/min
Stage 6 Speed: 0 mph

## 2015-12-10 MED ORDER — TECHNETIUM TC 99M SESTAMIBI - CARDIOLITE
12.4600 | Freq: Once | INTRAVENOUS | Status: AC | PRN
  Administered 2015-12-10: 09:00:00 12.46 via INTRAVENOUS

## 2015-12-10 MED ORDER — TECHNETIUM TC 99M SESTAMIBI - CARDIOLITE
32.9460 | Freq: Once | INTRAVENOUS | Status: AC | PRN
  Administered 2015-12-10: 10:00:00 32.946 via INTRAVENOUS

## 2016-01-03 DIAGNOSIS — L570 Actinic keratosis: Secondary | ICD-10-CM | POA: Diagnosis not present

## 2016-02-04 ENCOUNTER — Other Ambulatory Visit: Payer: Self-pay | Admitting: Family Medicine

## 2016-02-04 NOTE — Telephone Encounter (Signed)
Patient states that he needs a refill on tamsulosin (FLOMAX) 0.4 MG CAPS capsule and levothyroxine (SYNTHROID, LEVOTHROID) 75 MCG tablet.  He stated that he was getting these medications from the doctor that he was seeing before he started seeing you again and is out of his Levothyroxine.  He uses [facility] in [place].

## 2016-02-05 MED ORDER — TAMSULOSIN HCL 0.4 MG PO CAPS: 0.4000 mg | ORAL_CAPSULE | Freq: Every day | ORAL | Status: DC

## 2016-02-05 MED ORDER — LEVOTHYROXINE SODIUM 75 MCG PO TABS: 75.0000 ug | ORAL_TABLET | Freq: Every day | ORAL | Status: DC

## 2016-02-12 DIAGNOSIS — L309 Dermatitis, unspecified: Secondary | ICD-10-CM | POA: Diagnosis not present

## 2016-02-12 DIAGNOSIS — L308 Other specified dermatitis: Secondary | ICD-10-CM | POA: Diagnosis not present

## 2016-02-26 DIAGNOSIS — Z86010 Personal history of colonic polyps: Secondary | ICD-10-CM | POA: Diagnosis not present

## 2016-03-03 DIAGNOSIS — M25561 Pain in right knee: Secondary | ICD-10-CM | POA: Diagnosis not present

## 2016-03-03 DIAGNOSIS — M1711 Unilateral primary osteoarthritis, right knee: Secondary | ICD-10-CM | POA: Diagnosis not present

## 2016-03-09 ENCOUNTER — Telehealth: Payer: Self-pay | Admitting: Family Medicine

## 2016-03-09 NOTE — Telephone Encounter (Signed)
error 

## 2016-03-24 ENCOUNTER — Other Ambulatory Visit: Payer: Self-pay | Admitting: Family Medicine

## 2016-03-24 DIAGNOSIS — I10 Essential (primary) hypertension: Secondary | ICD-10-CM

## 2016-03-24 NOTE — Telephone Encounter (Signed)
Patient is requesting for a refill on losartan (COZAAR) 25 MG tablet   [facility] ([place])  Cb# [phone]

## 2016-03-25 MED ORDER — LOSARTAN POTASSIUM 25 MG PO TABS: 25.0000 mg | ORAL_TABLET | Freq: Every day | ORAL | Status: DC

## 2016-03-30 ENCOUNTER — Encounter: Payer: Self-pay | Admitting: *Deleted

## 2016-03-31 ENCOUNTER — Encounter: Payer: Self-pay | Admitting: Anesthesiology

## 2016-03-31 ENCOUNTER — Ambulatory Visit
Admission: RE | Admit: 2016-03-31 | Discharge: 2016-03-31 | Disposition: A | Discharge status: Home / Self Care | Payer: Medicare Other | Source: Ambulatory Visit | Attending: Gastroenterology | Admitting: Gastroenterology

## 2016-03-31 ENCOUNTER — Ambulatory Visit: Payer: Medicare Other | Admitting: Anesthesiology

## 2016-03-31 ENCOUNTER — Encounter
Admission: RE | Disposition: A | Discharge status: Home / Self Care | Payer: Self-pay | Source: Ambulatory Visit | Attending: Gastroenterology

## 2016-03-31 DIAGNOSIS — D124 Benign neoplasm of descending colon: Secondary | ICD-10-CM | POA: Diagnosis not present

## 2016-03-31 DIAGNOSIS — K5730 Diverticulosis of large intestine without perforation or abscess without bleeding: Secondary | ICD-10-CM | POA: Diagnosis not present

## 2016-03-31 DIAGNOSIS — K5790 Diverticulosis of intestine, part unspecified, without perforation or abscess without bleeding: Secondary | ICD-10-CM | POA: Diagnosis not present

## 2016-03-31 DIAGNOSIS — D125 Benign neoplasm of sigmoid colon: Secondary | ICD-10-CM | POA: Diagnosis not present

## 2016-03-31 DIAGNOSIS — Z7982 Long term (current) use of aspirin: Secondary | ICD-10-CM | POA: Insufficient documentation

## 2016-03-31 DIAGNOSIS — Z1211 Encounter for screening for malignant neoplasm of colon: Secondary | ICD-10-CM | POA: Diagnosis not present

## 2016-03-31 DIAGNOSIS — D123 Benign neoplasm of transverse colon: Secondary | ICD-10-CM | POA: Diagnosis not present

## 2016-03-31 DIAGNOSIS — Z86010 Personal history of colonic polyps: Secondary | ICD-10-CM | POA: Insufficient documentation

## 2016-03-31 DIAGNOSIS — K219 Gastro-esophageal reflux disease without esophagitis: Secondary | ICD-10-CM | POA: Diagnosis not present

## 2016-03-31 DIAGNOSIS — E119 Type 2 diabetes mellitus without complications: Secondary | ICD-10-CM | POA: Insufficient documentation

## 2016-03-31 DIAGNOSIS — E079 Disorder of thyroid, unspecified: Secondary | ICD-10-CM | POA: Diagnosis not present

## 2016-03-31 DIAGNOSIS — D122 Benign neoplasm of ascending colon: Secondary | ICD-10-CM | POA: Diagnosis not present

## 2016-03-31 DIAGNOSIS — I739 Peripheral vascular disease, unspecified: Secondary | ICD-10-CM | POA: Diagnosis not present

## 2016-03-31 DIAGNOSIS — I10 Essential (primary) hypertension: Secondary | ICD-10-CM | POA: Insufficient documentation

## 2016-03-31 DIAGNOSIS — Z79899 Other long term (current) drug therapy: Secondary | ICD-10-CM | POA: Diagnosis not present

## 2016-03-31 HISTORY — PX: COLONOSCOPY WITH PROPOFOL: SHX5780

## 2016-03-31 SURGERY — COLONOSCOPY WITH PROPOFOL
Anesthesia: General

## 2016-03-31 MED ORDER — MIDAZOLAM HCL 2 MG/2ML IJ SOLN
INTRAMUSCULAR | Status: DC | PRN
  Administered 2016-03-31: 1 mg via INTRAVENOUS

## 2016-03-31 MED ORDER — FENTANYL CITRATE (PF) 100 MCG/2ML IJ SOLN
INTRAMUSCULAR | Status: DC | PRN
  Administered 2016-03-31: 50 ug via INTRAVENOUS

## 2016-03-31 MED ORDER — SODIUM CHLORIDE 0.9 % IV SOLN
INTRAVENOUS | Status: DC
  Administered 2016-03-31: 10:00:00 via INTRAVENOUS

## 2016-03-31 MED ORDER — PROPOFOL 500 MG/50ML IV EMUL
INTRAVENOUS | Status: DC | PRN
  Administered 2016-03-31: 120 ug/kg/min via INTRAVENOUS

## 2016-03-31 MED ORDER — SODIUM CHLORIDE 0.9 % IV SOLN: INTRAVENOUS | Status: DC

## 2016-03-31 NOTE — Anesthesia Procedure Notes (Signed)
Performed by: COOK-MARTIN, Jawanza Zambito Pre-anesthesia Checklist: Patient identified, Emergency Drugs available, Suction available, Patient being monitored and Timeout performed Patient Re-evaluated:Patient Re-evaluated prior to inductionOxygen Delivery Method: Nasal cannula Preoxygenation: Pre-oxygenation with 100% oxygen Intubation Type: IV induction Placement Confirmation: positive ETCO2 and CO2 detector       

## 2016-03-31 NOTE — Consult Note (Addendum)
Outpatient short stay form Pre-procedure 03/31/2016 10:09 AM Lollie Sails MD  Primary Physician: Dr. Johny Drilling  Reason for visit:  Colonoscopy  History of present illness:  Patient is a 80 year old male presenting today as above. He has a personal history of adenomatous colon polyps. His last colonoscopy was about 6 years ago. He tolerated his prep. He takes no blood thinning agents. He does however take 81 mg aspirin daily and has held that for several days.    Current Facility-Administered Medications:  .  0.9 %  sodium chloride infusion, , Intravenous, Continuous, Lollie Sails, MD, Last Rate: 20 mL/hr at 03/31/16 1003 .  0.9 %  sodium chloride infusion, , Intravenous, Continuous, Lollie Sails, MD  Prescriptions Prior to Admission  Medication Sig Dispense Refill Last Dose  . aspirin EC 81 MG tablet Take 81 mg by mouth daily.   Past Week at Unknown time  . levothyroxine (SYNTHROID, LEVOTHROID) 75 MCG tablet Take 1 tablet (75 mcg total) by mouth daily before breakfast. 90 tablet 2 03/30/2016 at Unknown time  . losartan (COZAAR) 25 MG tablet Take 1 tablet (25 mg total) by mouth daily. 90 tablet 4 03/30/2016 at Unknown time  . Multiple Vitamins-Minerals (MULTIVITAMIN ADULT PO) Take 1 tablet by mouth daily.   Past Week at Unknown time  . omeprazole (PRILOSEC) 20 MG capsule Take 20 mg by mouth daily.   03/30/2016 at Unknown time  . tamsulosin (FLOMAX) 0.4 MG CAPS capsule Take 1 capsule (0.4 mg total) by mouth daily after supper. 30 capsule 2 03/30/2016 at Unknown time  . triamterene-hydrochlorothiazide (MAXZIDE) 75-50 MG tablet Take 1 tablet by mouth daily.   03/30/2016 at Unknown time  . cetirizine (ZYRTEC) 10 MG tablet Take 10 mg by mouth daily.   Taking  . clotrimazole-betamethasone (LOTRISONE) cream Apply 1 application topically 2 (two) times daily.   Taking     No Known Allergies   Past Medical History:  Diagnosis Date  . Diabetes mellitus without complication ([place])    . GERD (gastroesophageal reflux disease)   . Hypertension   . Phimosis   . Thyroid disease     Review of systems:      Physical Exam    Heart and lungs: Regular rate and rhythm without rub or gallop, lungs are bilaterally clear.    HEENT: Normocephalic atraumatic eyes are anicteric    Other:     Pertinant exam for procedure: Soft nontender nondistended bowel sounds positive normoactive.    Planned proceedures: Colonoscopy and indicated procedures. I have discussed the risks benefits and complications of procedures to include not limited to bleeding, infection, perforation and the risk of sedation and the patient wishes to proceed.    Lollie Sails, MD Gastroenterology 03/31/2016  10:09 AM    HIstory and physical located under consult tag. Joanne Chars

## 2016-03-31 NOTE — Anesthesia Preprocedure Evaluation (Signed)
Anesthesia Evaluation  Patient identified by MRN, date of birth, ID band Patient awake    Reviewed: Allergy & Precautions, H&P , NPO status , Patient's Chart, lab work & pertinent test results  History of Anesthesia Complications Negative for: history of anesthetic complications  Airway Mallampati: III  TM Distance: <3 FB Neck ROM: limited    Dental  (+) Poor Dentition   Pulmonary neg pulmonary ROS, neg shortness of breath,    Pulmonary exam normal breath sounds clear to auscultation       Cardiovascular Exercise Tolerance: Good hypertension, (-) angina+ Peripheral Vascular Disease  (-) Past MI and (-) DOE Normal cardiovascular exam Rhythm:regular Rate:Normal     Neuro/Psych TIAnegative psych ROS   GI/Hepatic Neg liver ROS, GERD  ,  Endo/Other  diabetesHypothyroidism   Renal/GU negative Renal ROS  negative genitourinary   Musculoskeletal   Abdominal   Peds  Hematology negative hematology ROS (+)   Anesthesia Other Findings Past Medical History: No date: Diabetes mellitus without complication (H[facility]) No date: GERD (gastroesophageal reflux disease) No date: Hypertension No date: Phimosis No date: Thyroid disease  Past Surgical History: No date: CIRCUMCISION 1994: SINUS SURGERY     Comment: on left side No date: TONSILLECTOMY     Reproductive/Obstetrics negative OB ROS                             Anesthesia Physical Anesthesia Plan  ASA: III  Anesthesia Plan: General   Post-op Pain Management:    Induction:   Airway Management Planned:   Additional Equipment:   Intra-op Plan:   Post-operative Plan:   Informed Consent: I have reviewed the patients History and Physical, chart, labs and discussed the procedure including the risks, benefits and alternatives for the proposed anesthesia with the patient or authorized representative who has indicated his/her understanding and  acceptance.   Dental Advisory Given  Plan Discussed with: Anesthesiologist, CRNA and Surgeon  Anesthesia Plan Comments:         Anesthesia Quick Evaluation

## 2016-03-31 NOTE — Transfer of Care (Signed)
Immediate Anesthesia Transfer of Care Note  Patient: Ernest James.  Procedure(s) Performed: Procedure(s): COLONOSCOPY WITH PROPOFOL (N/A)  Patient Location: PACU  Anesthesia Type:General  Level of Consciousness: awake and sedated  Airway & Oxygen Therapy: Patient Spontanous Breathing and Patient connected to nasal cannula oxygen  Post-op Assessment: Report given to RN and Post -op Vital signs reviewed and stable  Post vital signs: Reviewed and stable  Last Vitals:  Vitals:   03/31/16 0948  BP: 133/86  Pulse: 72  Resp: (!) 22  Temp: 36.7 C    Last Pain:  Vitals:   03/31/16 0948  TempSrc: Tympanic         Complications: No apparent anesthesia complications

## 2016-03-31 NOTE — Anesthesia Postprocedure Evaluation (Signed)
Anesthesia Post Note  Patient: [facility] [name].  Procedure(s) Performed: Procedure(s) (LRB): COLONOSCOPY WITH PROPOFOL (N/A)  Patient location during evaluation: Endoscopy Anesthesia Type: General Level of consciousness: awake and alert Pain management: pain level controlled Vital Signs Assessment: post-procedure vital signs reviewed and stable Respiratory status: spontaneous breathing, nonlabored ventilation, respiratory function stable and patient connected to nasal cannula oxygen Cardiovascular status: blood pressure returned to baseline and stable Postop Assessment: no signs of nausea or vomiting Anesthetic complications: no    Last Vitals:  Vitals:   03/31/16 0948 03/31/16 1050  BP: 133/86 (!) 94/58  Pulse: 72 65  Resp: (!) 22 18  Temp: 36.7 C (!) 35.9 C    Last Pain:  Vitals:   03/31/16 1050  TempSrc: Tympanic                 Precious Haws Yemaya Barnier

## 2016-03-31 NOTE — Op Note (Signed)
[facility] Gastroenterology Patient Name: Ernest James Procedure Date: 03/31/2016 10:11 AM MRN: [id number] Account #: [id number] Date of Birth: October 28, 1935 Admit Type: Outpatient Age: 80 Room: [facility] ENDO ROOM 3 Gender: Male Note Status: Finalized Procedure:            Colonoscopy Indications:          Personal history of colonic polyps Providers:            Lollie Sails, MD Referring MD:         Kirstie Peri. Caryn Section, MD (Referring MD) Medicines:            Monitored Anesthesia Care Complications:        No immediate complications. Procedure:            Pre-Anesthesia Assessment:                       - ASA Grade Assessment: III - A patient with severe                        systemic disease.                       After obtaining informed consent, the colonoscope was                        passed under direct vision. Throughout the procedure,                        the patient's blood pressure, pulse, and oxygen                        saturations were monitored continuously. The                        Colonoscope was introduced through the anus and                        advanced to the the cecum, identified by appendiceal                        orifice and ileocecal valve. The colonoscopy was                        performed without difficulty. The patient tolerated the                        procedure well. The quality of the bowel preparation                        was good. Findings:      A few medium-mouthed diverticula were found in the sigmoid colon and       descending colon.      A 2 mm polyp was found in the proximal ascending colon. The polyp was       flat. The polyp was removed with a cold biopsy forceps. Resection and       retrieval were complete.      Three flat polyps were found in the transverse colon. The polyps were 1       to 3 mm in size. These polyps were removed with a cold biopsy forceps.  Resection and retrieval were  complete.      A 2 mm polyp was found in the descending colon. The polyp was flat. The       polyp was removed with a cold biopsy forceps. Resection and retrieval       were complete.      A 5 mm polyp was found in the mid sigmoid colon. The polyp was flat. The       polyp was removed with a cold snare. Resection and retrieval were       complete.      The retroflexed view of the distal rectum and anal verge was normal and       showed no anal or rectal abnormalities.      The digital rectal exam was normal. Impression:           - Diverticulosis in the sigmoid colon and in the                        descending colon.                       - One 2 mm polyp in the proximal ascending colon,                        removed with a cold biopsy forceps. Resected and                        retrieved.                       - Three 1 to 3 mm polyps in the transverse colon,                        removed with a cold biopsy forceps. Resected and                        retrieved.                       - One 2 mm polyp in the descending colon, removed with                        a cold biopsy forceps. Resected and retrieved.                       - One 5 mm polyp in the mid sigmoid colon, removed with                        a cold snare. Resected and retrieved.                       - The distal rectum and anal verge are normal on                        retroflexion view. Recommendation:       - Discharge patient to home.                       - Discharge patient to home.                       -  Await pathology results.                       - Telephone GI clinic for pathology results in 1 week. Procedure Code(s):    --- Professional ---                       [id number], Colonoscopy, flexible; with removal of tumor(s),                        polyp(s), or other lesion(s) by snare technique                       45380, 64, Colonoscopy, flexible; with biopsy, single                        or  multiple Diagnosis Code(s):    --- Professional ---                       D12.2, Benign neoplasm of ascending colon                       D12.4, Benign neoplasm of descending colon                       D12.5, Benign neoplasm of sigmoid colon                       D12.3, Benign neoplasm of transverse colon (hepatic                        flexure or splenic flexure)                       Z86.010, Personal history of colonic polyps                       K57.30, Diverticulosis of large intestine without                        perforation or abscess without bleeding CPT copyright 2016 American Medical Association. All rights reserved. The codes documented in this report are preliminary and upon coder review may  be revised to meet current compliance requirements. Lollie Sails, MD 03/31/2016 10:51:38 AM This report has been signed electronically. Number of Addenda: 0 Note Initiated On: 03/31/2016 10:11 AM Scope Withdrawal Time: 0 hours 16 minutes 42 seconds  Total Procedure Duration: 0 hours 24 minutes 12 seconds       [facility]

## 2016-04-01 ENCOUNTER — Encounter: Payer: Self-pay | Admitting: Gastroenterology

## 2016-04-01 LAB — SURGICAL PATHOLOGY

## 2016-04-13 ENCOUNTER — Telehealth: Payer: Self-pay | Admitting: Family Medicine

## 2016-04-13 MED ORDER — TRIAMTERENE-HCTZ 75-50 MG PO TABS: 1.0000 | ORAL_TABLET | Freq: Every day | ORAL | 0 refills | Status: DC

## 2016-04-13 NOTE — Telephone Encounter (Signed)
Pt needs to have triamt-hctx 75/50 mg  He uses [facility] [place]  Pt's cal back [phone]  Thank sTeri

## 2016-05-20 ENCOUNTER — Encounter (INDEPENDENT_AMBULATORY_CARE_PROVIDER_SITE_OTHER): Payer: Self-pay | Admitting: Vascular Surgery

## 2016-05-27 ENCOUNTER — Encounter: Payer: Self-pay | Admitting: Family Medicine

## 2016-05-27 ENCOUNTER — Ambulatory Visit (INDEPENDENT_AMBULATORY_CARE_PROVIDER_SITE_OTHER): Payer: Medicare Other | Admitting: Family Medicine

## 2016-05-27 VITALS — BP 120/64 | HR 52 | Temp 98.1°F | Resp 16 | Wt 201.0 lb

## 2016-05-27 DIAGNOSIS — R7303 Prediabetes: Secondary | ICD-10-CM | POA: Diagnosis not present

## 2016-05-27 DIAGNOSIS — N400 Enlarged prostate without lower urinary tract symptoms: Secondary | ICD-10-CM

## 2016-05-27 DIAGNOSIS — I10 Essential (primary) hypertension: Secondary | ICD-10-CM

## 2016-05-27 DIAGNOSIS — Z23 Encounter for immunization: Secondary | ICD-10-CM | POA: Diagnosis not present

## 2016-05-27 DIAGNOSIS — I6522 Occlusion and stenosis of left carotid artery: Secondary | ICD-10-CM | POA: Diagnosis not present

## 2016-05-27 LAB — POCT GLYCOSYLATED HEMOGLOBIN (HGB A1C)
ESTIMATED AVERAGE GLUCOSE: 131
HEMOGLOBIN A1C: 6.2

## 2016-05-27 NOTE — Progress Notes (Signed)
Patient: Ernest James Ernest James. Male    DOB: 05-May-1936   80 y.o.   MRN: [id number] Visit Date: 05/27/2016  Today's Provider: Lelon Huh, MD   Chief Complaint  Patient presents with  . Follow-up  . Hypothyroidism  . Hypertension   Subjective:    HPI   Pre-diabetes: From 11/22/2015-advised to avoid sweets and starchy foods.   Lab Results  Component Value Date   HGBA1C 6.2 (H) 11/22/2015   HGBA1C 5.6 11/09/2011      Hypertension, follow-up:  BP Readings from Last 3 Encounters:  05/27/16 120/64  05/20/16 (!) 165/83  03/31/16 (!) 94/58    He was last seen for hypertension 6 months ago.  BP at that visit was 120/64. Management since that visit includes; no changes.He reports good compliance with treatment. He is not having side effects. none He is exercising. He is adherent to low salt diet.   Outside blood pressures are normal. He is experiencing none.  Patient denies none.   Cardiovascular risk factors include pre-diabetes Use of agents associated with hypertension: none.   ----------------------------------------------------------------    No Known Allergies   Current Outpatient Prescriptions:  .  aspirin EC 81 MG tablet, Take 81 mg by mouth daily., Disp: , Rfl:  .  Glucosamine HCl (GLUCOSAMINE PO), Take by mouth., Disp: , Rfl:  .  levothyroxine (SYNTHROID, LEVOTHROID) 75 MCG tablet, Take 1 tablet (75 mcg total) by mouth daily before breakfast., Disp: 90 tablet, Rfl: 2 .  losartan (COZAAR) 25 MG tablet, Take 1 tablet (25 mg total) by mouth daily., Disp: 90 tablet, Rfl: 4 .  omeprazole (PRILOSEC) 20 MG capsule, Take 20 mg by mouth daily., Disp: , Rfl:  .  tamsulosin (FLOMAX) 0.4 MG CAPS capsule, Take 1 capsule (0.4 mg total) by mouth daily after supper., Disp: 30 capsule, Rfl: 2 .  triamterene-hydrochlorothiazide (MAXZIDE) 75-50 MG tablet, Take 1 tablet by mouth daily. PATIENT NEEDS TO SCHEDULE OFFICE VISIT FOR FOLLOW UP, Disp: 90 tablet,  Rfl: 0 .  cetirizine (ZYRTEC) 10 MG tablet, Take 10 mg by mouth daily., Disp: , Rfl:  .  Multiple Vitamins-Minerals (MULTIVITAMIN ADULT PO), Take 1 tablet by mouth daily., Disp: , Rfl:   Review of Systems  Constitutional: Negative for appetite change, chills and fever.  Respiratory: Negative for chest tightness, shortness of breath and wheezing.   Cardiovascular: Negative for chest pain and palpitations.  Gastrointestinal: Negative for abdominal pain, nausea and vomiting.    Social History  Substance Use Topics  . Smoking status: Never Smoker  . Smokeless tobacco: Never Used  . Alcohol use No   Objective:   BP 120/64 (BP Location: Right Arm, Patient Position: Sitting, Cuff Size: Large)   Pulse (!) 52   Temp 98.1 F (36.7 C) (Oral)   Resp 16   Wt 201 lb (91.2 kg)   SpO2 96%   BMI 29.26 kg/m   Physical Exam   General Appearance:    Alert, cooperative, no distress  Eyes:    PERRL, conjunctiva/corneas clear, EOM's intact       Lungs:     Clear to auscultation bilaterally, respirations unlabored  Heart:    Regular rate and rhythm  Neurologic:   Awake, alert, oriented x 3. No apparent focal neurological           defect.       Results for orders placed or performed in visit on 05/27/16  POCT glycosylated hemoglobin (Hb A1C)  Result Value  Ref Range   Hemoglobin A1C 6.2    Est. average glucose Bld gHb Est-mCnc 131        Assessment & Plan:     1. Pre-diabetes Stable, continue low glycemic index diet and regular exersize - POCT glycosylated hemoglobin (Hb A1C)  2. Carotid stenosis, left Asymptomatic. Has follow up with Dr. Delana Meyer in October  3. BPH (benign prostatic hyperplasia) Doing well with tamsulosin, but is expensive, can try OTC Saw Palmetto  4. Benign essential HTN Well controlled.  Continue current medications.    5. Need for influenza vaccination  - Flu vaccine HIGH DOSE PF     The entirety of the information documented in the History of Present  Illness, Review of Systems and Physical Exam were personally obtained by me. Portions of this information were initially documented by April M. Sabra Heck, CMA and reviewed by me for thoroughness and accuracy.    Lelon Huh, MD  [facility]

## 2016-05-27 NOTE — Patient Instructions (Addendum)
Try OTC Saw Palmetto in place of tamsulosin to improve urinary flow

## 2016-06-12 ENCOUNTER — Other Ambulatory Visit (INDEPENDENT_AMBULATORY_CARE_PROVIDER_SITE_OTHER): Payer: Self-pay | Admitting: Vascular Surgery

## 2016-06-12 DIAGNOSIS — I6523 Occlusion and stenosis of bilateral carotid arteries: Secondary | ICD-10-CM

## 2016-06-15 ENCOUNTER — Ambulatory Visit (INDEPENDENT_AMBULATORY_CARE_PROVIDER_SITE_OTHER): Payer: Medicare Other | Admitting: Vascular Surgery

## 2016-06-15 ENCOUNTER — Ambulatory Visit (INDEPENDENT_AMBULATORY_CARE_PROVIDER_SITE_OTHER): Payer: Medicare Other

## 2016-06-15 ENCOUNTER — Encounter (INDEPENDENT_AMBULATORY_CARE_PROVIDER_SITE_OTHER): Payer: Self-pay | Admitting: Vascular Surgery

## 2016-06-15 VITALS — BP 147/77 | HR 64 | Resp 12 | Ht 69.5 in | Wt 199.0 lb

## 2016-06-15 DIAGNOSIS — I6522 Occlusion and stenosis of left carotid artery: Secondary | ICD-10-CM | POA: Diagnosis not present

## 2016-06-15 DIAGNOSIS — I10 Essential (primary) hypertension: Secondary | ICD-10-CM | POA: Diagnosis not present

## 2016-06-15 DIAGNOSIS — I6523 Occlusion and stenosis of bilateral carotid arteries: Secondary | ICD-10-CM

## 2016-06-15 DIAGNOSIS — G451 Carotid artery syndrome (hemispheric): Secondary | ICD-10-CM

## 2016-06-15 NOTE — Progress Notes (Signed)
MRN : [id number]  [facility] Ernest James. is a 80 y.o. (1935-10-20) male who presents with chief complaint of  Chief Complaint  Patient presents with  . Follow-up    Carotid u/s  .  History of Present Illness: The patient is seen for follow up evaluation of carotid stenosis. The carotid stenosis followed by ultrasound.   The patient denies amaurosis fugax. There is no recent history of TIA symptoms or focal motor deficits. There is no prior documented CVA.  The patient is taking enteric-coated aspirin 81 mg daily.  There is no history of migraine headaches. There is no history of seizures.  The patient has a history of coronary artery disease, no recent episodes of angina or shortness of breath. The patient denies PAD or claudication symptoms. There is a history of hyperlipidemia which is being treated with a statin.       Current Outpatient Prescriptions  Medication Sig Dispense Refill  . aspirin EC 81 MG tablet Take 81 mg by mouth daily.    . cetirizine (ZYRTEC) 10 MG tablet Take 10 mg by mouth daily.    . Glucosamine HCl (GLUCOSAMINE PO) Take by mouth.    . levothyroxine (SYNTHROID, LEVOTHROID) 75 MCG tablet Take 1 tablet (75 mcg total) by mouth daily before breakfast. 90 tablet 2  . losartan (COZAAR) 25 MG tablet Take 1 tablet (25 mg total) by mouth daily. 90 tablet 4  . Multiple Vitamins-Minerals (MULTIVITAMIN ADULT PO) Take 1 tablet by mouth daily.    Marland Kitchen omeprazole (PRILOSEC) 20 MG capsule Take 20 mg by mouth daily.    . tamsulosin (FLOMAX) 0.4 MG CAPS capsule Take 1 capsule (0.4 mg total) by mouth daily after supper. 30 capsule 2  . triamterene-hydrochlorothiazide (MAXZIDE) 75-50 MG tablet Take 1 tablet by mouth daily. PATIENT NEEDS TO SCHEDULE OFFICE VISIT FOR FOLLOW UP 90 tablet 0   No current facility-administered medications for this visit.     Past Medical History:  Diagnosis Date  . Diabetes mellitus without complication ([place])   . GERD (gastroesophageal  reflux disease)   . Hypertension   . Phimosis   . Thyroid disease     Past Surgical History:  Procedure Laterality Date  . CIRCUMCISION    . COLONOSCOPY WITH PROPOFOL N/A 03/31/2016   Procedure: COLONOSCOPY WITH PROPOFOL;  Surgeon: Lollie Sails, MD;  Location: [facility] ENDOSCOPY;  Service: Endoscopy;  Laterality: N/A;  . SINUS SURGERY  1994   on left side  . TONSILLECTOMY      Social History Social History  Substance Use Topics  . Smoking status: Never Smoker  . Smokeless tobacco: Never Used  . Alcohol use No     Family History Family History  Problem Relation Age of Onset  . Alzheimer's disease Mother   . CAD Father   . Diabetes Sister     type 2     No Known Allergies   REVIEW OF SYSTEMS (Negative unless checked)  Constitutional: [] Weight loss  [] Fever  [] Chills Cardiac: [] Chest pain   [] Chest pressure   [] Palpitations   [] Shortness of breath when laying flat   [] Shortness of breath at rest   [] Shortness of breath with exertion. Vascular:  [] Pain in legs with walking   [] Pain in legs at rest   [] History of DVT   [] Phlebitis   [] Swelling in legs   [] Varicose veins Pulmonary:   [] Uses home oxygen   [] Productive cough   [] Hemoptysis   [] Wheeze  [] COPD   [] Asthma  Neurologic:  [] Dizziness  [] Blackouts   [] Seizures   [] History of stroke   [] History of TIA  [] Aphasia   [] Temporary blindness   [] Dysphagia   [] Weakness or numbness in arms   [] Weakness or numbness in legs Musculoskeletal:  [] Arthritis   [] Joint swelling   [] Joint pain   [] Low back pain Hematologic:  [] Easy bruising  [] Easy bleeding   [] Hypercoagulable state   [] Anemic   Gastrointestinal:   [] Vomiting  [] Gastroesophageal reflux/heartburn   [] Abdominal pain Genitourinary:  [] Chronic kidney disease   [] Difficult urination  [] Frequent urination  [] Burning with urination   [] Hematuria Skin:  [] Rashes   [] Ulcers   [] Wounds Psychological:  []  anxiety   []  depression.  Physical Examination  BP (!) 147/77    Pulse 64   Resp 12   Ht 5' 9.5" (1.765 m)   Wt 199 lb (90.3 kg)   BMI 28.97 kg/m  Gen:  WD/WN, NAD Head: [place]/AT, No temporalis wasting. Ear/Nose/Throat: Hearing grossly intact, nares w/o erythema or drainage, trachea midline Eyes: PERRLA.  Sclera non-icteric Neck: Supple.  No JVD.  Pulmonary:  Good air movement, no use of accessory muscles. Clear bilaterally Cardiac: RRR, normal S1, S2 Vascular:  Soft right carotid bruit noted Vessel Right Left  Radial Palpable Palpable  Ulnar Palpable Palpable  Brachial Palpable Palpable  Carotid Palpable, without bruit Palpable, without bruit   Gastrointestinal: Non-tender/non-distended. No guarding.  Musculoskeletal: M/S 5/5 throughout.  No deformity or atrophy.  Neurologic: CN 2-12 intact. Pain and light touch intact in extremities.  Symmetrical.  Speech is fluent.  Psychiatric: Judgment intact, Mood & affect appropriate for pt's clinical situation. Dermatologic: No rashes or ulcers noted.  No cellulitis or open wounds.    Labs Recent Results (from the past 2160 hour(s))  Surgical pathology     Status: None   Collection Time: 03/31/16 10:30 AM  Result Value Ref Range   SURGICAL PATHOLOGY      Surgical Pathology CASE: [id number] PATIENT: Colin Ina Surgical Pathology Report     SPECIMEN SUBMITTED: A. Colon polyp, proximal ascending; cbx B. Colon polyps, transverse; cbx C. Colon polyp, descending; cbx D. Colon polyp, mid-sigmoid; cold snare  CLINICAL HISTORY: None provided  PRE-OPERATIVE DIAGNOSIS: HX polyps  POST-OPERATIVE DIAGNOSIS: Diverticulosis     DIAGNOSIS: A. COLON POLYP, PROXIMAL ASCENDING; COLD BIOPSY: - TUBULAR ADENOMA. - NEGATIVE FOR HIGH-GRADE DYSPLASIA AND MALIGNANCY.  B. COLON POLYP, TRANSVERSE; COLD BIOPSY: - TUBULAR ADENOMA (1), NEGATIVE FOR HIGH-GRADE DYSPLASIA AND MALIGNANCY. - PROMINENT LYMPHOID AGGREGATE (2).  C. COLON POLYP, DESCENDING; COLD BIOPSY: - TUBULAR ADENOMA. - NEGATIVE FOR  HIGH-GRADE DYSPLASIA AND MALIGNANCY.  D. COLON POLYP, MID SIGMOID; COLD SNARE: - TUBULAR ADENOMA. - NEGATIVE FOR HIGH-GRADE DYSPLASIA AND MALIGNANCY.   GROSS DESCRIPTION:  A. Labeled: C BX proximal ascending colon polyp   Tissue fragment(s): 1  Size: 0.25 cm  Description: pink  Entirely submitted in 1 cassette(s).   B. Labeled: C BX transverse colon polyps  Tissue fragment(s): 3  Size: 0.3-0.5 cm  Description: pink-tan  Entirely submitted in 1 cassette(s).  C. Labeled: C BX descending colon polyp  Tissue fragment(s): 1  Size: 0.3 cm  Description: pink-tan  Entirely submitted in one cassette(s).  D. Labeled: C snare and sigmoid colon polyp  Tissue fragment(s): 1  Size: 0.35 cm  Description: pink-tan  Entirely submitted in one cassette(s).    Final Diagnosis performed by Delorse Lek, MD.  Electronically signed 04/01/2016 1:22:48PM    The electronic signature indicates that the named Attending Pathologist  has evaluated the specimen  Technical component performed at [facility], [address], [place], [place] [address] Lab: [phone] Dir: Darrick Penna. Evette Doffing, MD  Professional component performed at [facility], [facility], [place], [place], [place] [address] Lab: [phone] Dir: Dellia Nims. Rubinas, MD    POCT glycosylated hemoglobin (Hb A1C)     Status: None   Collection Time: 05/27/16  8:22 AM  Result Value Ref Range   Hemoglobin A1C 6.2    Est. average glucose Bld gHb Est-mCnc 131     Radiology No results found.  Assessment/Plan  1.  Carotid stenosis, bilateral (433.30  I65.23) Recommend:  Given the patient's asymptomatic subcritical stenosis no further invasive testing or surgery at this time.  Duplex ultrasound shows <30% right and 40-59% left carotid stenosis bilaterally.  Continue antiplatelet therapy as prescribed Continue management of CAD, HTN and Hyperlipidemia Healthy heart diet,  encouraged exercise  at least 4 times per week Follow up in 12 months with duplex ultrasound and physical exam based on <50% stenosis of the Lt carotid artery   2.  Hyperlipidemia (272.4  E78.5)  On ASA and statin for medical optimization. Encouraged good control as it slows the progression of atherosclerotic disease.  3.  Diabetes mellitus (250.00  E11.9)   [name], MD  06/15/2016 10:39 AM    This note was created with Dragon medical transcription system.  Any errors from dictation are purely unintentional

## 2016-07-20 DIAGNOSIS — Z1283 Encounter for screening for malignant neoplasm of skin: Secondary | ICD-10-CM | POA: Diagnosis not present

## 2016-07-20 DIAGNOSIS — L570 Actinic keratosis: Secondary | ICD-10-CM | POA: Diagnosis not present

## 2016-07-20 DIAGNOSIS — Z872 Personal history of diseases of the skin and subcutaneous tissue: Secondary | ICD-10-CM | POA: Diagnosis not present

## 2016-08-04 ENCOUNTER — Other Ambulatory Visit: Payer: Self-pay | Admitting: *Deleted

## 2016-08-04 MED ORDER — TAMSULOSIN HCL 0.4 MG PO CAPS
0.4000 mg | ORAL_CAPSULE | Freq: Every day | ORAL | 4 refills | Status: DC
Start: 2016-08-04 — End: 2016-11-25

## 2016-08-04 NOTE — Telephone Encounter (Signed)
Requesting 90 day supply.

## 2016-10-08 ENCOUNTER — Other Ambulatory Visit: Payer: Self-pay | Admitting: Family Medicine

## 2016-10-13 DIAGNOSIS — H04123 Dry eye syndrome of bilateral lacrimal glands: Secondary | ICD-10-CM | POA: Diagnosis not present

## 2016-10-29 ENCOUNTER — Other Ambulatory Visit: Payer: Self-pay | Admitting: Family Medicine

## 2016-11-25 ENCOUNTER — Ambulatory Visit (INDEPENDENT_AMBULATORY_CARE_PROVIDER_SITE_OTHER): Payer: Medicare Other | Admitting: Family Medicine

## 2016-11-25 ENCOUNTER — Encounter: Payer: Self-pay | Admitting: Family Medicine

## 2016-11-25 VITALS — BP 130/60 | HR 58 | Temp 98.4°F | Resp 16 | Ht 70.0 in | Wt 205.0 lb

## 2016-11-25 DIAGNOSIS — R7303 Prediabetes: Secondary | ICD-10-CM | POA: Diagnosis not present

## 2016-11-25 DIAGNOSIS — I6522 Occlusion and stenosis of left carotid artery: Secondary | ICD-10-CM

## 2016-11-25 DIAGNOSIS — R3911 Hesitancy of micturition: Secondary | ICD-10-CM | POA: Diagnosis not present

## 2016-11-25 DIAGNOSIS — E781 Pure hyperglyceridemia: Secondary | ICD-10-CM | POA: Diagnosis not present

## 2016-11-25 DIAGNOSIS — Z86010 Personal history of colonic polyps: Secondary | ICD-10-CM | POA: Diagnosis not present

## 2016-11-25 DIAGNOSIS — I10 Essential (primary) hypertension: Secondary | ICD-10-CM | POA: Diagnosis not present

## 2016-11-25 DIAGNOSIS — N401 Enlarged prostate with lower urinary tract symptoms: Secondary | ICD-10-CM | POA: Diagnosis not present

## 2016-11-25 DIAGNOSIS — E039 Hypothyroidism, unspecified: Secondary | ICD-10-CM | POA: Diagnosis not present

## 2016-11-25 LAB — POCT GLYCOSYLATED HEMOGLOBIN (HGB A1C)
ESTIMATED AVERAGE GLUCOSE: 140
HEMOGLOBIN A1C: 6.5

## 2016-11-25 MED ORDER — TAMSULOSIN HCL 0.4 MG PO CAPS: 0.4000 mg | ORAL_CAPSULE | Freq: Every day | ORAL | 4 refills | Status: DC

## 2016-11-25 NOTE — Progress Notes (Signed)
Patient: [name]. Male    DOB: 1936/01/19   81 y.o.   MRN: [id number] Visit Date: 11/25/2016  Today's Provider: Lelon Huh, MD   Chief Complaint  Patient presents with  . Follow-up   Subjective:    HPI    Hypertension, follow-up:  BP Readings from Last 3 Encounters:  06/15/16 (!) 147/77  05/27/16 120/64  05/20/16 (!) 165/83    He was last seen for hypertension 6 months ago.  BP at that visit was; no changes. Management since that visit includes continuing same medications. Marland KitchenHe reports good compliance with treatment. He is not having side effects.  He is not exercising. He is adherent to low salt diet.   Outside blood pressures are not being checked. Patient denies chest pain, dyspnea, fatigue, irregular heart beat, orthopnea, palpitations, syncope and tachypnea.    ----------------------------------------------------------------  Pre-diabetes From 05/27/2016-Stable, continue low glycemic index diet and regular exercise. Last A1c 6.2  BPH (benign prostatic hyperplasia) He states he tried stopping Flomax but started having increased urinary frequency and urgency and would like to get new prescription to start back on.   Hypothyroidism, unspecified hypothyroidism type From 11/22/2015-no changes were made Lab Results  Component Value Date   TSH 6.240 (H) 11/22/2015     No Known Allergies   Current Outpatient Prescriptions:  .  aspirin EC 81 MG tablet, Take 81 mg by mouth daily., Disp: , Rfl:  .  cetirizine (ZYRTEC) 10 MG tablet, Take 10 mg by mouth daily., Disp: , Rfl:  .  Glucosamine HCl (GLUCOSAMINE PO), Take by mouth., Disp: , Rfl:  .  levothyroxine (SYNTHROID, LEVOTHROID) 75 MCG tablet, TAKE ONE TABLET BY MOUTH ONCE DAILY BEFORE BREAKFAST, Disp: 90 tablet, Rfl: 4 .  losartan (COZAAR) 25 MG tablet, Take 1 tablet (25 mg total) by mouth daily., Disp: 90 tablet, Rfl: 4 .  Multiple Vitamins-Minerals (MULTIVITAMIN ADULT PO), Take 1  tablet by mouth daily., Disp: , Rfl:  .  omeprazole (PRILOSEC) 20 MG capsule, Take 20 mg by mouth daily., Disp: , Rfl:  .  tamsulosin (FLOMAX) 0.4 MG CAPS capsule, Take 1 capsule (0.4 mg total) by mouth daily after supper., Disp: 90 capsule, Rfl: 4 .  triamterene-hydrochlorothiazide (MAXZIDE) 75-50 MG tablet, Take 1 tablet by mouth daily., Disp: 90 tablet, Rfl: 3  Review of Systems  Constitutional: Negative for appetite change, chills and fever.  Respiratory: Negative for chest tightness, shortness of breath and wheezing.   Cardiovascular: Negative for chest pain and palpitations.  Gastrointestinal: Negative for abdominal pain, nausea and vomiting.    Social History  Substance Use Topics  . Smoking status: Never Smoker  . Smokeless tobacco: Never Used  . Alcohol use No   Objective:   BP 130/60 (BP Location: Right Arm, Patient Position: Sitting, Cuff Size: Large)   Pulse (!) 58   Temp 98.4 F (36.9 C) (Oral)   Resp 16   Ht 5\' 10"  (1.778 m)   Wt 205 lb (93 kg)   SpO2 96%   BMI 29.41 kg/m     Physical Exam   General Appearance:    Alert, cooperative, no distress  Eyes:    PERRL, conjunctiva/corneas clear, EOM's intact       Lungs:     Clear to auscultation bilaterally, respirations unlabored  Heart:    Regular rate and rhythm  Neurologic:   Awake, alert, oriented x 3. No apparent focal neurological  defect.         Results for orders placed or performed in visit on 11/25/16  POCT glycosylated hemoglobin (Hb A1C)  Result Value Ref Range   Hemoglobin A1C 6.5    Est. average glucose Bld gHb Est-mCnc 140        Assessment & Plan:     1. Pre-diabetes He anticipates improving diet and increasing exercise the next few months. Return to check a1c in 4 months.  - POCT glycosylated hemoglobin (Hb A1C)  2. Benign essential HTN Well controlled.  Continue current medications.   - Comprehensive metabolic panel  3. Stenosis of left carotid artery Followed by Dr.  Delana Meyer. Counseled patient that he may benefit from statin   4. Hypothyroidism, unspecified type  - TSH  5. History of adenomatous polyp of colon Followed by Dr. Sheppard Coil  6. Benign prostatic hyperplasia with urinary hesitancy Restart Flomax  7. Pure hyperglyceridemia  - Comprehensive metabolic panel - Lipid panel       Lelon Huh, MD  [facility]

## 2016-11-25 NOTE — Patient Instructions (Signed)
It is recommended to engage in 150 minutes of moderate exercise every week.   

## 2016-11-26 ENCOUNTER — Telehealth: Payer: Self-pay

## 2016-11-26 LAB — LIPID PANEL
CHOL/HDL RATIO: 3.7 ratio (ref 0.0–5.0)
Cholesterol, Total: 161 mg/dL (ref 100–199)
HDL: 43 mg/dL (ref >39)
LDL Calculated: 82 mg/dL (ref 0–99)
TRIGLYCERIDES: 181 mg/dL — AB (ref 0–149)
VLDL CHOLESTEROL CAL: 36 mg/dL (ref 5–40)

## 2016-11-26 LAB — COMPREHENSIVE METABOLIC PANEL
ALT: 49 IU/L — AB (ref 0–44)
AST: 35 IU/L (ref 0–40)
Albumin/Globulin Ratio: 1.3 (ref 1.2–2.2)
Albumin: 4.2 g/dL (ref 3.5–4.8)
Alkaline Phosphatase: 48 IU/L (ref 39–117)
BILIRUBIN TOTAL: 0.6 mg/dL (ref 0.0–1.2)
BUN/Creatinine Ratio: 16 (ref 10–24)
BUN: 17 mg/dL (ref 8–27)
CO2: 26 mmol/L (ref 18–29)
CREATININE: 1.09 mg/dL (ref 0.76–1.27)
Calcium: 10 mg/dL (ref 8.6–10.2)
Chloride: 94 mmol/L — ABNORMAL LOW (ref 96–106)
GFR calc non Af Amer: 65 mL/min/{1.73_m2} (ref >59)
GFR, EST AFRICAN AMERICAN: 75 mL/min/{1.73_m2} (ref >59)
GLUCOSE: 125 mg/dL — AB (ref 65–99)
Globulin, Total: 3.3 g/dL (ref 1.5–4.5)
Potassium: 4.3 mmol/L (ref 3.5–5.2)
Sodium: 136 mmol/L (ref 134–144)
TOTAL PROTEIN: 7.5 g/dL (ref 6.0–8.5)

## 2016-11-26 LAB — TSH: TSH: 5.29 u[IU]/mL — AB (ref 0.450–4.500)

## 2016-11-26 NOTE — Telephone Encounter (Signed)
-----   Message from Birdie Sons, MD sent at 11/26/2016  7:58 AM EDT ----- Thyroid functions are good. Continue current dose of levothyroxine Cholesterol is good, but considering he borderline for diabetes and has cholesterol plaques in carotid arteries, he should take a statin to help prevent strokes, recommend atorvastatin 40mg  once a day, #30, rf x 4 Call if any problems or side effects from medication.  Follow up o.v.  four months for sugar and cholesterol check.

## 2016-11-26 NOTE — Telephone Encounter (Signed)
Tried calling patient, but no answer. VM box not set up. Will try again later.

## 2016-11-30 ENCOUNTER — Other Ambulatory Visit: Payer: Self-pay | Admitting: Emergency Medicine

## 2016-11-30 MED ORDER — ATORVASTATIN CALCIUM 40 MG PO TABS: 40.0000 mg | ORAL_TABLET | Freq: Every day | ORAL | 4 refills | Status: DC

## 2016-11-30 MED ORDER — ATORVASTATIN CALCIUM 40 MG PO TABS: 80.0000 mg | ORAL_TABLET | Freq: Every day | ORAL | 4 refills | Status: DC

## 2016-11-30 NOTE — Telephone Encounter (Signed)
Pt informed and voiced understanding of results. 

## 2016-11-30 NOTE — Telephone Encounter (Signed)
Tried cell, no answer and no VM but LMTCB at home number.

## 2017-02-23 ENCOUNTER — Other Ambulatory Visit: Payer: Self-pay | Admitting: *Deleted

## 2017-02-23 DIAGNOSIS — I10 Essential (primary) hypertension: Secondary | ICD-10-CM

## 2017-02-23 MED ORDER — LOSARTAN POTASSIUM 25 MG PO TABS: 25.0000 mg | ORAL_TABLET | Freq: Every day | ORAL | 4 refills | Status: DC

## 2017-03-30 ENCOUNTER — Encounter: Payer: Self-pay | Admitting: Family Medicine

## 2017-03-30 ENCOUNTER — Ambulatory Visit (INDEPENDENT_AMBULATORY_CARE_PROVIDER_SITE_OTHER): Payer: Medicare Other | Admitting: Family Medicine

## 2017-03-30 VITALS — BP 140/72 | HR 60 | Temp 98.4°F | Resp 16 | Wt 204.0 lb

## 2017-03-30 DIAGNOSIS — I6522 Occlusion and stenosis of left carotid artery: Secondary | ICD-10-CM

## 2017-03-30 DIAGNOSIS — R7303 Prediabetes: Secondary | ICD-10-CM | POA: Diagnosis not present

## 2017-03-30 DIAGNOSIS — G4700 Insomnia, unspecified: Secondary | ICD-10-CM | POA: Diagnosis not present

## 2017-03-30 DIAGNOSIS — E781 Pure hyperglyceridemia: Secondary | ICD-10-CM

## 2017-03-30 DIAGNOSIS — I10 Essential (primary) hypertension: Secondary | ICD-10-CM

## 2017-03-30 MED ORDER — LOSARTAN POTASSIUM 25 MG PO TABS: 25.0000 mg | ORAL_TABLET | Freq: Every day | ORAL | 4 refills | Status: DC

## 2017-03-30 NOTE — Patient Instructions (Signed)
Try taking melatonin between 3 to 10mg  at bedtime to help sleep

## 2017-03-30 NOTE — Progress Notes (Signed)
Patient: [facility] Ernest James. Male    DOB: [birth year]   81 y.o.   MRN: [id number] Visit Date: 03/30/2017  Today's Provider: Lelon Huh, MD   Chief Complaint  Patient presents with  . Hypertension    follow up  . Hypothyroidism    follow up  . Hyperglycemia    follow up  . Hyperlipidemia    follow up   Subjective:    HPI  Hypertension, follow-up:  BP Readings from Last 3 Encounters:  11/25/16 130/60  06/15/16 (!) 147/77  05/27/16 120/64    He was last seen for hypertension 4 months ago.  BP at that visit was 130/60. Management since that visit includes no changes. He reports good compliance with treatment. Patient ran out of medication 1 week ago.  He is not having side effects.  He is not exercising. He is not adherent to low salt diet.   Outside blood pressures are checked occasionally at the pharmacy. He is experiencing none.  Patient denies chest pain, chest pressure/discomfort, claudication, dyspnea, exertional chest pressure/discomfort, fatigue, irregular heart beat, lower extremity edema, near-syncope, orthopnea, palpitations, paroxysmal nocturnal dyspnea, syncope and tachypnea.   Cardiovascular risk factors include advanced age (older than 96 for men, 10 for women), hypertension and male gender.  Use of agents associated with hypertension: NSAIDS.     Weight trend: stable Wt Readings from Last 3 Encounters:  11/25/16 205 lb (93 kg)  06/15/16 199 lb (90.3 kg)  05/27/16 201 lb (91.2 kg)    Current diet: in general, an "unhealthy" diet  ------------------------------------------------------------------------    Lipid/Cholesterol, Follow-up:   Last seen for this 4 months ago.  Management changes since that visit include starting Atorvastatin 40mg  daily. . Last Lipid Panel:    Component Value Date/Time   CHOL 161 11/25/2016 0849   TRIG 181 (H) 11/25/2016 0849   HDL 43 11/25/2016 0849   CHOLHDL 3.7 11/25/2016 0849   LDLCALC 82  11/25/2016 0849    Risk factors for vascular disease include hypercholesterolemia and hypertension  He reports good compliance with treatment. He is not having side effects.  Current symptoms include none and have been stable. Weight trend: stable Prior visit with dietician: no Current diet: in general, an "unhealthy" diet Current exercise: none  Wt Readings from Last 3 Encounters:  11/25/16 205 lb (93 kg)  06/15/16 199 lb (90.3 kg)  05/27/16 201 lb (91.2 kg)    -------------------------------------------------------------------  Prediabetes, Follow-up:   Lab Results  Component Value Date   HGBA1C 6.5 11/25/2016   HGBA1C 6.2 05/27/2016   HGBA1C 6.2 (H) 11/22/2015   GLUCOSE 125 (H) 11/25/2016   GLUCOSE 110 (H) 11/20/2015    Last seen for for this 4 months ago.  Management since that visit includes no changes. Current symptoms include none and have been stable.  Weight trend: stable Prior visit with dietician: no Current diet: in general, an "unhealthy" diet Current exercise: none  Pertinent Labs:    Component Value Date/Time   CHOL 161 11/25/2016 0849   TRIG 181 (H) 11/25/2016 0849   CHOLHDL 3.7 11/25/2016 0849   CREATININE 1.09 11/25/2016 0849    Wt Readings from Last 3 Encounters:  11/25/16 205 lb (93 kg)  06/15/16 199 lb (90.3 kg)  05/27/16 201 lb (91.2 kg)   He also states over the last several months he has trouble sleeping through the nigh, he has no difficulty falling asleep, but wakes after 4-5 hours and can't get back  to sleep, there doesn't seem to be any apparent cause for him not to get back to sleep.     No Known Allergies   Current Outpatient Prescriptions:  .  aspirin EC 81 MG tablet, Take 81 mg by mouth daily., Disp: , Rfl:  .  atorvastatin (LIPITOR) 40 MG tablet, Take 1 tablet (40 mg total) by mouth daily., Disp: 30 tablet, Rfl: 4 .  levothyroxine (SYNTHROID, LEVOTHROID) 75 MCG tablet, TAKE ONE TABLET BY MOUTH ONCE DAILY BEFORE  BREAKFAST, Disp: 90 tablet, Rfl: 4 .  losartan (COZAAR) 25 MG tablet, Take 1 tablet (25 mg total) by mouth daily., Disp: 90 tablet, Rfl: 4 .  Multiple Vitamins-Minerals (MULTIVITAMIN ADULT PO), Take 1 tablet by mouth daily., Disp: , Rfl:  .  omeprazole (PRILOSEC) 20 MG capsule, Take 20 mg by mouth daily., Disp: , Rfl:  .  tamsulosin (FLOMAX) 0.4 MG CAPS capsule, Take 1 capsule (0.4 mg total) by mouth daily after supper., Disp: 90 capsule, Rfl: 4 .  triamterene-hydrochlorothiazide (MAXZIDE) 75-50 MG tablet, Take 1 tablet by mouth daily., Disp: 90 tablet, Rfl: 3  Review of Systems  Constitutional: Negative for appetite change, chills and fever.  Respiratory: Negative for chest tightness, shortness of breath and wheezing.   Cardiovascular: Negative for chest pain and palpitations.  Gastrointestinal: Negative for abdominal pain, nausea and vomiting.    Social History  Substance Use Topics  . Smoking status: Never Smoker  . Smokeless tobacco: Never Used  . Alcohol use No   Objective:   BP 140/72 (BP Location: Left Arm, Patient Position: Sitting, Cuff Size: Large)   Pulse 60   Temp 98.4 F (36.9 C) (Oral)   Resp 16   Wt 204 lb (92.5 kg)   SpO2 95% Comment: room air  BMI 29.27 kg/m  There were no vitals filed for this visit.   Physical Exam  General appearance: alert, well developed, well nourished, cooperative and in no distress Head: Normocephalic, without obvious abnormality, atraumatic Respiratory: Respirations even and unlabored, normal respiratory rate      Assessment & Plan:     1. Pure hyperglyceridemia He is tolerating initiation of atorvastatin well with no adverse effects.   - Lipid panel - Comprehensive metabolic panel  2. Pre-diabetes  - Hemoglobin A1c  3. Benign essential HTN Continue current medications.   - losartan (COZAAR) 25 MG tablet; Take 1 tablet (25 mg total) by mouth daily.  Dispense: 90 tablet; Refill: 4  4. Stenosis of left carotid  artery Continue high intensity statin.   5. Insomnia, unspecified type Denies any anxiety or depression. Try OTC melatonin.        Lelon Huh, MD  [facility]

## 2017-03-31 LAB — COMPREHENSIVE METABOLIC PANEL
ALT: 39 IU/L (ref 0–44)
AST: 34 IU/L (ref 0–40)
Albumin/Globulin Ratio: 1.5 (ref 1.2–2.2)
Albumin: 4.4 g/dL (ref 3.5–4.8)
Alkaline Phosphatase: 49 IU/L (ref 39–117)
BUN/Creatinine Ratio: 15 (ref 10–24)
BUN: 15 mg/dL (ref 8–27)
Bilirubin Total: 0.8 mg/dL (ref 0.0–1.2)
CALCIUM: 10.3 mg/dL — AB (ref 8.6–10.2)
CO2: 29 mmol/L (ref 20–29)
Chloride: 96 mmol/L (ref 96–106)
Creatinine, Ser: 1.02 mg/dL (ref 0.76–1.27)
GFR calc Af Amer: 80 mL/min/{1.73_m2} (ref >59)
GFR, EST NON AFRICAN AMERICAN: 70 mL/min/{1.73_m2} (ref >59)
GLUCOSE: 120 mg/dL — AB (ref 65–99)
Globulin, Total: 2.9 g/dL (ref 1.5–4.5)
Potassium: 4.1 mmol/L (ref 3.5–5.2)
Sodium: 139 mmol/L (ref 134–144)
Total Protein: 7.3 g/dL (ref 6.0–8.5)

## 2017-03-31 LAB — LIPID PANEL
CHOL/HDL RATIO: 2.3 ratio (ref 0.0–5.0)
Cholesterol, Total: 109 mg/dL (ref 100–199)
HDL: 47 mg/dL (ref >39)
LDL CALC: 38 mg/dL (ref 0–99)
Triglycerides: 120 mg/dL (ref 0–149)
VLDL Cholesterol Cal: 24 mg/dL (ref 5–40)

## 2017-03-31 LAB — HEMOGLOBIN A1C
ESTIMATED AVERAGE GLUCOSE: 146 mg/dL
Hgb A1c MFr Bld: 6.7 % — ABNORMAL HIGH (ref 4.8–5.6)

## 2017-05-01 ENCOUNTER — Other Ambulatory Visit: Payer: Self-pay | Admitting: Family Medicine

## 2017-05-20 ENCOUNTER — Ambulatory Visit (INDEPENDENT_AMBULATORY_CARE_PROVIDER_SITE_OTHER): Payer: Medicare Other | Admitting: Family Medicine

## 2017-05-20 ENCOUNTER — Encounter: Payer: Self-pay | Admitting: Family Medicine

## 2017-05-20 VITALS — BP 122/60 | HR 61 | Temp 97.7°F | Resp 16 | Wt 201.0 lb

## 2017-05-20 DIAGNOSIS — I6522 Occlusion and stenosis of left carotid artery: Secondary | ICD-10-CM

## 2017-05-20 DIAGNOSIS — J0100 Acute maxillary sinusitis, unspecified: Secondary | ICD-10-CM | POA: Diagnosis not present

## 2017-05-20 MED ORDER — AMOXICILLIN 500 MG PO CAPS: 1000.0000 mg | ORAL_CAPSULE | Freq: Three times a day (TID) | ORAL | 0 refills | Status: AC

## 2017-05-20 NOTE — Progress Notes (Signed)
Patient: Ernest James. Male    DOB: 1936-03-06   81 y.o.   MRN: [id number] Visit Date: 05/20/2017  Today's Provider: Lelon Huh, MD   Chief Complaint  Patient presents with  . Sinusitis   Subjective:    Sinusitis  This is a new problem. The current episode started yesterday. The problem is unchanged. There has been no fever. Associated symptoms include diaphoresis, ear pain (left ear) and a sore throat (left side). Pertinent negatives include no chills, congestion, coughing, headaches, hoarse voice, neck pain, shortness of breath, sinus pressure, sneezing or swollen glands. Treatments tried: Ibuprofen. The treatment provided moderate relief.       No Known Allergies   Current Outpatient Prescriptions:  .  aspirin EC 81 MG tablet, Take 81 mg by mouth daily., Disp: , Rfl:  .  atorvastatin (LIPITOR) 40 MG tablet, TAKE 1 TABLET BY MOUTH ONCE DAILY, Disp: 30 tablet, Rfl: 12 .  levothyroxine (SYNTHROID, LEVOTHROID) 75 MCG tablet, TAKE ONE TABLET BY MOUTH ONCE DAILY BEFORE BREAKFAST, Disp: 90 tablet, Rfl: 4 .  losartan (COZAAR) 25 MG tablet, Take 1 tablet (25 mg total) by mouth daily., Disp: 90 tablet, Rfl: 4 .  Multiple Vitamins-Minerals (MULTIVITAMIN ADULT PO), Take 1 tablet by mouth daily., Disp: , Rfl:  .  omeprazole (PRILOSEC) 20 MG capsule, Take 20 mg by mouth daily., Disp: , Rfl:  .  tamsulosin (FLOMAX) 0.4 MG CAPS capsule, Take 1 capsule (0.4 mg total) by mouth daily after supper., Disp: 90 capsule, Rfl: 4 .  triamterene-hydrochlorothiazide (MAXZIDE) 75-50 MG tablet, Take 1 tablet by mouth daily., Disp: 90 tablet, Rfl: 3  Review of Systems  Constitutional: Positive for diaphoresis. Negative for appetite change, chills and fever.  HENT: Positive for ear pain (left ear), sinus pain (left side) and sore throat (left side). Negative for congestion, hoarse voice, nosebleeds, postnasal drip, rhinorrhea, sinus pressure and sneezing.   Eyes: Positive for itching.  Negative for pain, discharge and redness.  Respiratory: Negative for cough, chest tightness, shortness of breath and wheezing.   Cardiovascular: Negative for chest pain and palpitations.  Gastrointestinal: Negative for abdominal pain, nausea and vomiting.  Musculoskeletal: Negative for neck pain.  Neurological: Negative for headaches.    Social History  Substance Use Topics  . Smoking status: Never Smoker  . Smokeless tobacco: Never Used  . Alcohol use No   Objective:   BP 122/60 (BP Location: Left Arm, Patient Position: Sitting, Cuff Size: Large)   Pulse 61   Temp 97.7 F (36.5 C) (Oral)   Resp 16   Wt 201 lb (91.2 kg)   SpO2 96% Comment: room air  BMI 28.84 kg/m  Vitals:   05/20/17 1043  Resp: 16  Weight: 201 lb (91.2 kg)     Physical Exam  General Appearance:    Alert, cooperative, no distress  HENT:   bilateral TM normal without fluid or infection, neck without nodes,  Left sinus tender and nasal mucosa congested  Eyes:    PERRL, conjunctiva/corneas clear, EOM's intact       Lungs:     Clear to auscultation bilaterally, respirations unlabored  Heart:    Regular rate and rhythm  Neurologic:   Awake, alert, oriented x 3. No apparent focal neurological           defect.           Assessment & Plan:     1. Acute maxillary sinusitis, recurrence not specified  -  amoxicillin (AMOXIL) 500 MG capsule; Take 2 capsules (1,000 mg total) by mouth 3 (three) times daily.  Dispense: 30 capsule; Refill: 0       Lelon Huh, MD  [facility]

## 2017-06-16 ENCOUNTER — Encounter (INDEPENDENT_AMBULATORY_CARE_PROVIDER_SITE_OTHER): Payer: Self-pay | Admitting: Vascular Surgery

## 2017-06-16 ENCOUNTER — Ambulatory Visit (INDEPENDENT_AMBULATORY_CARE_PROVIDER_SITE_OTHER): Payer: Medicare Other

## 2017-06-16 ENCOUNTER — Ambulatory Visit (INDEPENDENT_AMBULATORY_CARE_PROVIDER_SITE_OTHER): Payer: Medicare Other | Admitting: Vascular Surgery

## 2017-06-16 VITALS — BP 142/69 | HR 57 | Resp 16 | Ht 70.0 in | Wt 197.0 lb

## 2017-06-16 DIAGNOSIS — I6522 Occlusion and stenosis of left carotid artery: Secondary | ICD-10-CM

## 2017-06-16 DIAGNOSIS — E781 Pure hyperglyceridemia: Secondary | ICD-10-CM | POA: Diagnosis not present

## 2017-06-16 DIAGNOSIS — I10 Essential (primary) hypertension: Secondary | ICD-10-CM | POA: Diagnosis not present

## 2017-06-16 DIAGNOSIS — I6523 Occlusion and stenosis of bilateral carotid arteries: Secondary | ICD-10-CM

## 2017-06-16 NOTE — Progress Notes (Signed)
Subjective:    Patient ID: Ernest Reef [name]., male    DOB: 01-Jan-1936, 81 y.o.   MRN: [id number] Chief Complaint  Patient presents with  . Carotid    44yr follow up   Patient presents for a yearly carotid stenosis follow-up. He presents today without complaint. He denies any amaurosis fugax or neurological symptoms. The patient underwent a bilateral carotid artery duplex exam which was notable for (40-59%) stenosis of the right internal carotid artery. Left internal carotid artery is (60-79%). External carotid artery velocities to just greater than 50% stenosis. Bilateral antegrade vertebrals. Normal bilateral subclavian artery waveforms. When compared to the prior study on 06/15/2016 Doppler velocities are increased bilaterally. Patient denies any fever, nausea or vomiting.   Review of Systems  Constitutional: Negative.   HENT: Negative.   Eyes: Negative.   Respiratory: Negative.   Cardiovascular: Negative.   Gastrointestinal: Negative.   Endocrine: Negative.   Genitourinary: Negative.   Musculoskeletal: Negative.   Skin: Negative.   Allergic/Immunologic: Negative.   Neurological: Negative.   Hematological: Negative.   Psychiatric/Behavioral: Negative.       Objective:   Physical Exam  Constitutional: He is oriented to person, place, and time. He appears well-developed and well-nourished. No distress.  HENT:  Head: Normocephalic and atraumatic.  Eyes: Pupils are equal, round, and reactive to light. Conjunctivae are normal.  Neck: Normal range of motion.  Mild left carotid bruit noted. No right bruit on exam.  Cardiovascular: Normal rate, regular rhythm, normal heart sounds and intact distal pulses.   Pulses:      Radial pulses are 2+ on the right side, and 2+ on the left side.  Pulmonary/Chest: Effort normal.  Musculoskeletal: Normal range of motion. He exhibits no edema.  Neurological: He is alert and oriented to person, place, and time.  Skin: Skin is warm  and dry. He is not diaphoretic.  Psychiatric: He has a normal mood and affect. His behavior is normal. Judgment and thought content normal.  Vitals reviewed.  BP (!) 142/69 (BP Location: Right Arm)   Pulse (!) 57   Resp 16   Ht 5\' 10"  (1.778 m)   Wt 197 lb (89.4 kg)   BMI 28.27 kg/m   Past Medical History:  Diagnosis Date  . Diabetes mellitus without complication ([place])   . GERD (gastroesophageal reflux disease)   . Hypertension   . Phimosis   . Thyroid disease    Social History   Social History  . Marital status: Married    Spouse name: N/A  . Number of children: N/A  . Years of education: N/A   Occupational History  . [employer]    Social History Main Topics  . Smoking status: Never Smoker  . Smokeless tobacco: Never Used  . Alcohol use No  . Drug use: No  . Sexual activity: Not on file   Other Topics Concern  . Not on file   Social History Narrative  . No narrative on file   Past Surgical History:  Procedure Laterality Date  . CIRCUMCISION    . COLONOSCOPY WITH PROPOFOL N/A 03/31/2016   Procedure: COLONOSCOPY WITH PROPOFOL;  Surgeon: Lollie Sails, MD;  Location: [facility] ENDOSCOPY;  Service: Endoscopy;  Laterality: N/A;  . SINUS SURGERY  1994   on left side  . TONSILLECTOMY     Family History  Problem Relation Age of Onset  . Alzheimer's disease Mother   . CAD Father   . Diabetes Sister  type 2   No Known Allergies     Assessment & Plan:  Patient presents for a yearly carotid stenosis follow-up. He presents today without complaint. He denies any amaurosis fugax or neurological symptoms. The patient underwent a bilateral carotid artery duplex exam which was notable for (40-59%) stenosis of the right internal carotid artery. Left internal carotid artery is (60-79%). External carotid artery velocities to just greater than 50% stenosis. Bilateral antegrade vertebrals. Normal bilateral subclavian artery waveforms. When compared to the prior study on  06/15/2016 Doppler velocities are increased bilaterally. Patient denies any fever, nausea or vomiting.  1. Bilateral carotid artery stenosis - Stable Patient with increased velocities to the bilateral internal carotid arteries. He is asymptomatic at this time Due to new guidelines and the left internal carotid artery being (60-79%) the patient's follow-up will now be every 6 months. We discussed his current anatomy and he understands why he is now following up every 6 months Studies reviewed with patient. Patient to remain abstinent of tobacco use. I have discussed with the patient at length the risk factors for and pathogenesis of atherosclerotic disease and encouraged a healthy diet, regular exercise regimen and blood pressure / glucose control.  Patient was instructed to contact our office in the interim with problems such as arm / leg weakness or numbness, speech / swallowing difficulty or temporary monocular blindness. The patient expresses their understanding.   - VAS US CAROTID; Future  2. Pure hyperglyceridemia - stable Encouraged good control as its slows the progression of atherosclerotic disease  3. Benign essential HTN - stable Encouraged good control as its slows the progression of atherosclerotic disease  Current Outpatient Prescriptions on File Prior to Visit  Medication Sig Dispense Refill  . aspirin EC 81 MG tablet Take 81 mg by mouth daily.    Marland Kitchen atorvastatin (LIPITOR) 40 MG tablet TAKE 1 TABLET BY MOUTH ONCE DAILY 30 tablet 12  . levothyroxine (SYNTHROID, LEVOTHROID) 75 MCG tablet TAKE ONE TABLET BY MOUTH ONCE DAILY BEFORE BREAKFAST 90 tablet 4  . losartan (COZAAR) 25 MG tablet Take 1 tablet (25 mg total) by mouth daily. 90 tablet 4  . Multiple Vitamins-Minerals (MULTIVITAMIN ADULT PO) Take 1 tablet by mouth daily.    Marland Kitchen omeprazole (PRILOSEC) 20 MG capsule Take 20 mg by mouth daily.    . tamsulosin (FLOMAX) 0.4 MG CAPS capsule Take 1 capsule (0.4 mg total) by mouth daily  after supper. 90 capsule 4  . triamterene-hydrochlorothiazide (MAXZIDE) 75-50 MG tablet Take 1 tablet by mouth daily. 90 tablet 3   No current facility-administered medications on file prior to visit.     There are no Patient Instructions on file for this visit. No Follow-up on file.   Jalyn Rosero A Charlese Gruetzmacher, PA-C

## 2017-06-19 DIAGNOSIS — Z23 Encounter for immunization: Secondary | ICD-10-CM | POA: Diagnosis not present

## 2017-09-29 NOTE — Progress Notes (Signed)
Patient: Ernest James [facility] [name]. Male    DOB: 10-Jan-1936   82 y.o.   MRN: [id number] Visit Date: 09/29/2017  Today's Provider: Lelon Huh, MD   Chief Complaint  Patient presents with  . Hypertension  . Insomnia  . Hyperglycemia   Subjective:    HPI  Hypertension, follow-up:  BP Readings from Last 3 Encounters:  06/16/17 (!) 142/69  05/20/17 122/60  03/30/17 140/72    He was last seen for hypertension 6 months ago.  BP at that visit was 142/72. Management since that visit includes; no changes.He reports good compliance with treatment. He is not having side effects.  He is not exercising. He is adherent to low salt diet.   Outside blood pressures are not being checked. He is experiencing none.  Patient denies chest pain, chest pressure/discomfort, claudication, dyspnea, exertional chest pressure/discomfort, fatigue, irregular heart beat, lower extremity edema, near-syncope, orthopnea, palpitations, paroxysmal nocturnal dyspnea, syncope and tachypnea.   Cardiovascular risk factors include advanced age (older than 15 for men, 70 for women), hypertension and male gender.  Use of agents associated with hypertension: NSAIDS and thyroid hormones.   ------------------------------------------------------------------------    Lipid/Cholesterol, Follow-up:   Last seen for this 6 months ago.  Management since that visit includes; labs checked, no changes.  Last Lipid Panel:    Component Value Date/Time   CHOL 109 03/30/2017 0000   TRIG 120 03/30/2017 0000   HDL 47 03/30/2017 0000   CHOLHDL 2.3 03/30/2017 0000   LDLCALC 38 03/30/2017 0000    He reports good compliance with treatment. He is not having side effects.   Wt Readings from Last 3 Encounters:  06/16/17 197 lb (89.4 kg)  05/20/17 201 lb (91.2 kg)  03/30/17 204 lb (92.5 kg)    ------------------------------------------------------------------------  Pre-diabetes From 03/30/2017-Hemoglobin  A1c 6.7. Advised to get more strict with diet, avoid all sweets. Reduce white starchy foods. Try to get more exercise. Today patient comes in reporting that he has not been exercising regularly or eating healthy meals (due to the recent holidays).  No Known Allergies   Current Outpatient Medications:  .  aspirin EC 81 MG tablet, Take 81 mg by mouth daily., Disp: , Rfl:  .  atorvastatin (LIPITOR) 40 MG tablet, TAKE 1 TABLET BY MOUTH ONCE DAILY, Disp: 30 tablet, Rfl: 12 .  levothyroxine (SYNTHROID, LEVOTHROID) 75 MCG tablet, TAKE ONE TABLET BY MOUTH ONCE DAILY BEFORE BREAKFAST, Disp: 90 tablet, Rfl: 4 .  losartan (COZAAR) 25 MG tablet, Take 1 tablet (25 mg total) by mouth daily., Disp: 90 tablet, Rfl: 4 .  Multiple Vitamins-Minerals (MULTIVITAMIN ADULT PO), Take 1 tablet by mouth daily., Disp: , Rfl:  .  omeprazole (PRILOSEC) 20 MG capsule, Take 20 mg by mouth daily., Disp: , Rfl:  .  tamsulosin (FLOMAX) 0.4 MG CAPS capsule, Take 1 capsule (0.4 mg total) by mouth daily after supper., Disp: 90 capsule, Rfl: 4 .  triamterene-hydrochlorothiazide (MAXZIDE) 75-50 MG tablet, Take 1 tablet by mouth daily., Disp: 90 tablet, Rfl: 3  Review of Systems  Constitutional: Negative for appetite change, chills and fever.  Respiratory: Negative for chest tightness, shortness of breath and wheezing.   Cardiovascular: Negative for chest pain and palpitations.  Gastrointestinal: Negative for abdominal pain, nausea and vomiting.    Social History   Tobacco Use  . Smoking status: Never Smoker  . Smokeless tobacco: Never Used  Substance Use Topics  . Alcohol use: No    Alcohol/week: 0.0 oz  Objective:   BP 120/70 (BP Location: Left Arm, Patient Position: Sitting, Cuff Size: Large)   Pulse 61   Temp 97.9 F (36.6 C) (Oral)   Resp 16   Ht 5\' 9"  (1.753 m)   Wt 200 lb (90.7 kg)   SpO2 97% Comment: room air  BMI 29.53 kg/m     Physical Exam   General Appearance:    Alert, cooperative, no distress    Eyes:    PERRL, conjunctiva/corneas clear, EOM's intact       Lungs:     Clear to auscultation bilaterally, respirations unlabored  Heart:    Regular rate and rhythm  Neurologic:   Awake, alert, oriented x 3. No apparent focal neurological           defect.       Results for orders placed or performed in visit on 09/30/17  POCT HgB A1C  Result Value Ref Range   Hemoglobin A1C 6.8    Est. average glucose Bld gHb Est-mCnc 148        Assessment & Plan:     1. Pre-diabetes Diet controlled. Continue current medications.   - POCT HgB A1C  2. Benign essential HTN Well controlled.  Continue current medications.         Lelon Huh, MD  [facility]

## 2017-09-30 ENCOUNTER — Ambulatory Visit (INDEPENDENT_AMBULATORY_CARE_PROVIDER_SITE_OTHER): Payer: Medicare Other | Admitting: Family Medicine

## 2017-09-30 ENCOUNTER — Encounter: Payer: Self-pay | Admitting: Family Medicine

## 2017-09-30 VITALS — BP 120/70 | HR 61 | Temp 97.9°F | Resp 16 | Ht 69.0 in | Wt 200.0 lb

## 2017-09-30 DIAGNOSIS — I10 Essential (primary) hypertension: Secondary | ICD-10-CM | POA: Diagnosis not present

## 2017-09-30 DIAGNOSIS — R7303 Prediabetes: Secondary | ICD-10-CM

## 2017-09-30 LAB — POCT GLYCOSYLATED HEMOGLOBIN (HGB A1C)
Est. average glucose Bld gHb Est-mCnc: 148
Hemoglobin A1C: 6.8

## 2017-10-10 ENCOUNTER — Other Ambulatory Visit: Payer: Self-pay | Admitting: Family Medicine

## 2017-11-10 ENCOUNTER — Other Ambulatory Visit: Payer: Self-pay | Admitting: Family Medicine

## 2017-12-16 ENCOUNTER — Encounter (INDEPENDENT_AMBULATORY_CARE_PROVIDER_SITE_OTHER): Payer: Self-pay | Admitting: Vascular Surgery

## 2017-12-16 ENCOUNTER — Ambulatory Visit (INDEPENDENT_AMBULATORY_CARE_PROVIDER_SITE_OTHER): Payer: Medicare Other

## 2017-12-16 ENCOUNTER — Ambulatory Visit (INDEPENDENT_AMBULATORY_CARE_PROVIDER_SITE_OTHER): Payer: Medicare Other | Admitting: Vascular Surgery

## 2017-12-16 VITALS — BP 135/78 | HR 71 | Resp 15 | Ht 69.5 in | Wt 197.0 lb

## 2017-12-16 DIAGNOSIS — I6523 Occlusion and stenosis of bilateral carotid arteries: Secondary | ICD-10-CM | POA: Diagnosis not present

## 2017-12-16 DIAGNOSIS — E785 Hyperlipidemia, unspecified: Secondary | ICD-10-CM | POA: Insufficient documentation

## 2017-12-16 DIAGNOSIS — E782 Mixed hyperlipidemia: Secondary | ICD-10-CM | POA: Diagnosis not present

## 2017-12-16 DIAGNOSIS — I10 Essential (primary) hypertension: Secondary | ICD-10-CM

## 2017-12-16 NOTE — Progress Notes (Signed)
MRN : [id number]  [facility] Ernest James. is a 82 y.o. (Dec 06, 1935) male who presents with chief complaint of  Chief Complaint  Patient presents with  . Follow-up    6 month carotid f/u  .  History of Present Illness:   The patient is seen for follow up evaluation of carotid stenosis. The carotid stenosis followed by ultrasound.   The patient denies amaurosis fugax. There is no recent history of TIA symptoms or focal motor deficits. There is no prior documented CVA.  The patient is taking enteric-coated aspirin 81 mg daily.  There is no history of migraine headaches. There is no history of seizures.  The patient has a history of coronary artery disease, no recent episodes of angina or shortness of breath. The patient denies PAD or claudication symptoms. There is a history of hyperlipidemia which is being treated with a statin.   Carotid duplex today shows 40-59% RICA and 12-87% LICA no change compared to last study  Current Meds  Medication Sig  . aspirin EC 81 MG tablet Take 81 mg by mouth daily.  Marland Kitchen atorvastatin (LIPITOR) 40 MG tablet TAKE 1 TABLET BY MOUTH ONCE DAILY  . Cholecalciferol (VITAMIN D3) 20 MCG TABS Take 1 tablet by mouth once.  . Cyanocobalamin (VITAMIN B12) 1000 MCG TBCR Take by mouth.  . levothyroxine (SYNTHROID, LEVOTHROID) 75 MCG tablet TAKE 1 TABLET BY MOUTH ONCE DAILY BEFORE BREAKFAST  . losartan (COZAAR) 25 MG tablet Take 1 tablet (25 mg total) by mouth daily.  . Multiple Vitamins-Minerals (MULTIVITAMIN ADULT PO) Take 1 tablet by mouth daily.  Marland Kitchen omeprazole (PRILOSEC) 20 MG capsule Take 20 mg by mouth daily.  . tamsulosin (FLOMAX) 0.4 MG CAPS capsule Take 1 capsule (0.4 mg total) by mouth daily after supper.  . triamterene-hydrochlorothiazide (MAXZIDE) 75-50 MG tablet TAKE ONE TABLET BY MOUTH ONCE DAILY    Past Medical History:  Diagnosis Date  . Diabetes mellitus without complication ([place])   . GERD (gastroesophageal reflux disease)   .  Hypertension   . Phimosis   . Thyroid disease     Past Surgical History:  Procedure Laterality Date  . CIRCUMCISION    . COLONOSCOPY WITH PROPOFOL N/A 03/31/2016   Procedure: COLONOSCOPY WITH PROPOFOL;  Surgeon: Lollie Sails, MD;  Location: [facility] ENDOSCOPY;  Service: Endoscopy;  Laterality: N/A;  . SINUS SURGERY  1994   on left side  . TONSILLECTOMY      Social History Social History   Tobacco Use  . Smoking status: Never Smoker  . Smokeless tobacco: Never Used  Substance Use Topics  . Alcohol use: No    Alcohol/week: 0.0 oz  . Drug use: No    Family History Family History  Problem Relation Age of Onset  . Alzheimer's disease Mother   . CAD Father   . Diabetes Sister        type 2    No Known Allergies   REVIEW OF SYSTEMS (Negative unless checked)  Constitutional: [] Weight loss  [] Fever  [] Chills Cardiac: [] Chest pain   [] Chest pressure   [] Palpitations   [] Shortness of breath when laying flat   [] Shortness of breath with exertion. Vascular:  [] Pain in legs with walking   [] Pain in legs at rest  [] History of DVT   [] Phlebitis   [] Swelling in legs   [] Varicose veins   [] Non-healing ulcers Pulmonary:   [] Uses home oxygen   [] Productive cough   [] Hemoptysis   [] Wheeze  [] COPD   [] Asthma  Neurologic:  [] Dizziness   [] Seizures   [] History of stroke   [] History of TIA  [] Aphasia   [] Vissual changes   [] Weakness or numbness in arm   [] Weakness or numbness in leg Musculoskeletal:   [] Joint swelling   [x] Joint pain   [] Low back pain Hematologic:  [] Easy bruising  [] Easy bleeding   [] Hypercoagulable state   [] Anemic Gastrointestinal:  [] Diarrhea   [] Vomiting  [] Gastroesophageal reflux/heartburn   [] Difficulty swallowing. Genitourinary:  [] Chronic kidney disease   [] Difficult urination  [] Frequent urination   [] Blood in urine Skin:  [] Rashes   [] Ulcers  Psychological:  [] History of anxiety   []  History of major depression.  Physical Examination  Vitals:   12/16/17 1401  12/16/17 1402  BP: 121/66 135/78  Pulse: 76 71  Resp: 15   Weight: 89.4 kg (197 lb)   Height: 5' 9.5" (1.765 m)    Body mass index is 28.67 kg/m. Gen: WD/WN, NAD Head: /AT, No temporalis wasting.  Ear/Nose/Throat: Hearing grossly intact, nares w/o erythema or drainage Eyes: PER, EOMI, sclera nonicteric.  Neck: Supple, no large masses.   Pulmonary:  Good air movement, no audible wheezing bilaterally, no use of accessory muscles.  Cardiac: RRR, no JVD Vascular:  Bilateral carotid bruit Vessel Right Left  Radial Palpable Palpable  Ulnar Palpable Palpable  Brachial Palpable Palpable  Carotid Palpable Palpable  Gastrointestinal: Non-distended. No guarding/no peritoneal signs.  Musculoskeletal: M/S 5/5 throughout.  No deformity or atrophy.  Neurologic: CN 2-12 intact. Symmetrical.  Speech is fluent. Motor exam as listed above. Psychiatric: Judgment intact, Mood & affect appropriate for pt's clinical situation. Dermatologic: No rashes or ulcers noted.  No changes consistent with cellulitis. Lymph : No lichenification or skin changes of chronic lymphedema.  CBC Lab Results  Component Value Date   WBC 7.9 11/20/2015   HGB 15.1 11/20/2015   HCT 44.4 11/20/2015   MCV 88.4 11/20/2015   PLT 272 11/20/2015    BMET    Component Value Date/Time   NA 139 03/30/2017 0000   K 4.1 03/30/2017 0000   CL 96 03/30/2017 0000   CO2 29 03/30/2017 0000   GLUCOSE 120 (H) 03/30/2017 0000   GLUCOSE 110 (H) 11/20/2015 1201   BUN 15 03/30/2017 0000   CREATININE 1.02 03/30/2017 0000   CALCIUM 10.3 (H) 03/30/2017 0000   GFRNONAA 70 03/30/2017 0000   GFRAA 80 03/30/2017 0000   CrCl cannot be calculated (Patient's most recent lab result is older than the maximum 21 days allowed.).  COAG No results found for: INR, PROTIME  Radiology No results found.   Assessment/Plan 1. Bilateral carotid artery stenosis Recommend:  Given the patient's asymptomatic subcritical stenosis no further  invasive testing or surgery at this time.  Carotid duplex today shows 40-59% RICA and 94-76% LICA no change compared to last study  Continue antiplatelet therapy as prescribed Continue management of CAD, HTN and Hyperlipidemia Healthy heart diet,  encouraged exercise at least 4 times per week Follow up in 6 months with duplex ultrasound and physical exam based on  >50% stenosis of the Lt carotid artery   - VAS US CAROTID; Future  2. Benign essential HTN Continue antihypertensive medications as already ordered, these medications have been reviewed and there are no changes at this time.   3. Mixed hyperlipidemia Continue statin as ordered and reviewed, no changes at this time     [name], MD  12/16/2017 2:43 PM

## 2018-01-05 DIAGNOSIS — D2371 Other benign neoplasm of skin of right lower limb, including hip: Secondary | ICD-10-CM | POA: Diagnosis not present

## 2018-01-05 DIAGNOSIS — M2011 Hallux valgus (acquired), right foot: Secondary | ICD-10-CM | POA: Diagnosis not present

## 2018-01-05 DIAGNOSIS — M79671 Pain in right foot: Secondary | ICD-10-CM | POA: Diagnosis not present

## 2018-01-14 ENCOUNTER — Other Ambulatory Visit: Payer: Self-pay | Admitting: Family Medicine

## 2018-02-08 DIAGNOSIS — M3501 Sicca syndrome with keratoconjunctivitis: Secondary | ICD-10-CM | POA: Diagnosis not present

## 2018-03-28 DIAGNOSIS — D2371 Other benign neoplasm of skin of right lower limb, including hip: Secondary | ICD-10-CM | POA: Diagnosis not present

## 2018-03-28 DIAGNOSIS — M79671 Pain in right foot: Secondary | ICD-10-CM | POA: Diagnosis not present

## 2018-03-28 DIAGNOSIS — M2011 Hallux valgus (acquired), right foot: Secondary | ICD-10-CM | POA: Diagnosis not present

## 2018-03-30 NOTE — Progress Notes (Signed)
Patient: [facility] Ernest James. Male    DOB: Feb 10, 1936   82 y.o.   MRN: [id number] Visit Date: 03/31/2018  Today's Provider: Lelon Huh, MD   Chief Complaint  Patient presents with  . Hypertension  . Hyperlipidemia  . Pre-Diabetes   Subjective:   Patient saw [name] for AWV today at 8:30 am.  HPI   Hypertension, follow-up:  BP Readings from Last 3 Encounters:  03/31/18 (!) 148/62  03/31/18 (!) 148/62  12/16/17 135/78    He was last seen for hypertension 6 months ago.  BP at that visit was 120/70. Management since that visit includes; no changes.He reports good compliance with treatment. He is not having side effects.  He is not exercising. He is adherent to low salt diet.   Outside blood pressures are not being checked. He is experiencing none.  Patient denies chest pain, chest pressure/discomfort, claudication, dyspnea, exertional chest pressure/discomfort, fatigue, irregular heart beat, lower extremity edema, near-syncope, orthopnea, palpitations, paroxysmal nocturnal dyspnea, syncope and tachypnea.   Cardiovascular risk factors include advanced age (older than 71 for men, 65 for women), dyslipidemia, hypertension and male gender.  Use of agents associated with hypertension: NSAIDS and thyroid hormones.   ------------------------------------------------------------------------    Lipid/Cholesterol, Follow-up:   Last seen for this 6 months ago.  Management since that visit includes; no changes.  Last Lipid Panel:    Component Value Date/Time   CHOL 109 03/30/2017 0000   TRIG 120 03/30/2017 0000   HDL 47 03/30/2017 0000   CHOLHDL 2.3 03/30/2017 0000   LDLCALC 38 03/30/2017 0000    He reports good compliance with treatment. He is not having side effects.   Wt Readings from Last 3 Encounters:  03/31/18 204 lb (92.5 kg)  03/31/18 204 lb (92.5 kg)  12/16/17 197 lb (89.4 kg)     ------------------------------------------------------------------------  Pre-diabetes From 09/30/2017-no changes. Diet controlled. Continue current medications. HgB A1C 6.8. Has not been following diet consistently. Does stay physically active.   Stenosis of left carotid artery From 03/30/2017-no changes. Continues on atorvastatin. Continues routine follow up Dr. Delana Meyer.     No Known Allergies   Current Outpatient Medications:  .  aspirin EC 81 MG tablet, Take 81 mg by mouth daily., Disp: , Rfl:  .  atorvastatin (LIPITOR) 40 MG tablet, TAKE 1 TABLET BY MOUTH ONCE DAILY, Disp: 30 tablet, Rfl: 12 .  Cholecalciferol (VITAMIN D3) 20 MCG TABS, Take 1 tablet by mouth once., Disp: , Rfl:  .  Cyanocobalamin (VITAMIN B12) 1000 MCG TBCR, Take by mouth., Disp: , Rfl:  .  levothyroxine (SYNTHROID, LEVOTHROID) 75 MCG tablet, TAKE 1 TABLET BY MOUTH ONCE DAILY BEFORE BREAKFAST, Disp: 90 tablet, Rfl: 4 .  losartan (COZAAR) 25 MG tablet, Take 1 tablet (25 mg total) by mouth daily., Disp: 90 tablet, Rfl: 4 .  Multiple Vitamins-Minerals (MULTIVITAMIN ADULT PO), Take 1 tablet by mouth daily., Disp: , Rfl:  .  omeprazole (PRILOSEC) 20 MG capsule, Take 20 mg by mouth daily., Disp: , Rfl:  .  tamsulosin (FLOMAX) 0.4 MG CAPS capsule, TAKE 1 CAPSULE BY MOUTH ONCE DAILY AFTER  SUPPER, Disp: 90 capsule, Rfl: 4 .  triamterene-hydrochlorothiazide (MAXZIDE) 75-50 MG tablet, TAKE ONE TABLET BY MOUTH ONCE DAILY, Disp: 90 tablet, Rfl: 4   Review of Systems  Constitutional: Negative.   Respiratory: Negative.   Cardiovascular: Negative.   Endocrine: Negative.   Musculoskeletal: Negative.   Psychiatric/Behavioral: Positive for sleep disturbance.    Social History  Tobacco Use  . Smoking status: Never Smoker  . Smokeless tobacco: Never Used  Substance Use Topics  . Alcohol use: No    Alcohol/week: 0.0 oz   Objective:   BP (!) 148/62 (BP Location: Right Arm, Patient Position: Sitting, Cuff Size: Large)    Pulse 61   Temp 98.8 F (37.1 C) (Oral)   Ht 5\' 10"  (1.778 m)   Wt 204 lb (92.5 kg)   BMI 29.27 kg/m    Physical Exam   General Appearance:    Alert, cooperative, no distress  Eyes:    PERRL, conjunctiva/corneas clear, EOM's intact       Lungs:     Clear to auscultation bilaterally, respirations unlabored  Heart:    Regular rate and rhythm  Neurologic:   Awake, alert, oriented x 3. No apparent focal neurological           defect.       Results for orders placed or performed in visit on 03/31/18  POCT HgB A1C  Result Value Ref Range   Hemoglobin A1C 7.1 (A) 4.0 - 5.6 %   HbA1c POC (<> result, manual entry)  4.0 - 5.6 %   HbA1c, POC (prediabetic range)  5.7 - 6.4 %   HbA1c, POC (controlled diabetic range)  0.0 - 7.0 %   EKG: Sinus bradycardia    Assessment & Plan:     1. Type 2 diabetes mellitus without complication, without long-term current use of insulin (HCC) Start metformin ER 500mg  once a day.  - POCT HgB A1C - Ambulatory referral to diabetic education  2. Benign essential HTN Not at goal. Consider increasing losartan if labs normal.  - EKG 12-Lead  3. Bilateral carotid artery stenosis Stable continue statin and regular follow up Dr. Delana Meyer.   4. Hypothyroidism, unspecified type  - TSH  5. Pure hyperglyceridemia He is tolerating atorvastatin well with no adverse effects.   - Comprehensive metabolic panel - Lipid panel       Lelon Huh, MD  [facility]

## 2018-03-31 ENCOUNTER — Ambulatory Visit (INDEPENDENT_AMBULATORY_CARE_PROVIDER_SITE_OTHER): Payer: Medicare Other | Admitting: Family Medicine

## 2018-03-31 ENCOUNTER — Encounter: Payer: Self-pay | Admitting: Family Medicine

## 2018-03-31 ENCOUNTER — Ambulatory Visit (INDEPENDENT_AMBULATORY_CARE_PROVIDER_SITE_OTHER): Payer: Medicare Other

## 2018-03-31 VITALS — BP 148/62 | HR 61 | Temp 98.8°F | Ht 70.0 in | Wt 204.0 lb

## 2018-03-31 DIAGNOSIS — E781 Pure hyperglyceridemia: Secondary | ICD-10-CM | POA: Diagnosis not present

## 2018-03-31 DIAGNOSIS — E039 Hypothyroidism, unspecified: Secondary | ICD-10-CM | POA: Diagnosis not present

## 2018-03-31 DIAGNOSIS — Z0000 Encounter for general adult medical examination without abnormal findings: Secondary | ICD-10-CM

## 2018-03-31 DIAGNOSIS — I10 Essential (primary) hypertension: Secondary | ICD-10-CM

## 2018-03-31 DIAGNOSIS — E119 Type 2 diabetes mellitus without complications: Secondary | ICD-10-CM | POA: Diagnosis not present

## 2018-03-31 DIAGNOSIS — I6523 Occlusion and stenosis of bilateral carotid arteries: Secondary | ICD-10-CM | POA: Diagnosis not present

## 2018-03-31 LAB — POCT GLYCOSYLATED HEMOGLOBIN (HGB A1C): Hemoglobin A1C: 7.1 % — AB (ref 4.0–5.6)

## 2018-03-31 MED ORDER — METFORMIN HCL ER 500 MG PO TB24: 500.0000 mg | ORAL_TABLET | Freq: Every day | ORAL | 4 refills | Status: DC

## 2018-03-31 NOTE — Patient Instructions (Addendum)
[name] , Thank you for taking time to come for your Medicare Wellness Visit. I appreciate your ongoing commitment to your health goals. Please review the following plan we discussed and let me know if I can assist you in the future.   Screening recommendations/referrals: Colonoscopy: Up to date Recommended yearly ophthalmology/optometry visit for glaucoma screening and checkup Recommended yearly dental visit for hygiene and checkup  Vaccinations: Influenza vaccine: Up to date Pneumococcal vaccine: Up to date Tdap vaccine: Up to date Shingles vaccine: Pt declines today.     Advanced directives: Please bring a copy of your POA (Power of Attorney) and/or Living Will to your next appointment.   Conditions/risks identified: Recommend increasing water intake to 4-6 glasses a day.   Next appointment: 9:00 AM today with Dr Caryn Section.   Preventive Care 80 Years and Older, Male Preventive care refers to lifestyle choices and visits with your health care provider that can promote health and wellness. What does preventive care include?  A yearly physical exam. This is also called an annual well check.  Dental exams once or twice a year.  Routine eye exams. Ask your health care provider how often you should have your eyes checked.  Personal lifestyle choices, including:  Daily care of your teeth and gums.  Regular physical activity.  Eating a healthy diet.  Avoiding tobacco and drug use.  Limiting alcohol use.  Practicing safe sex.  Taking low doses of aspirin every day.  Taking vitamin and mineral supplements as recommended by your health care provider. What happens during an annual well check? The services and screenings done by your health care provider during your annual well check will depend on your age, overall health, lifestyle risk factors, and family history of disease. Counseling  Your health care provider may ask you questions about your:  Alcohol use.  Tobacco  use.  Drug use.  Emotional well-being.  Home and relationship well-being.  Sexual activity.  Eating habits.  History of falls.  Memory and ability to understand (cognition).  Work and work Statistician. Screening  You may have the following tests or measurements:  Height, weight, and BMI.  Blood pressure.  Lipid and cholesterol levels. These may be checked every 5 years, or more frequently if you are over 62 years old.  Skin check.  Lung cancer screening. You may have this screening every year starting at age 64 if you have a 30-pack-year history of smoking and currently smoke or have quit within the past 15 years.  Fecal occult blood test (FOBT) of the stool. You may have this test every year starting at age 34.  Flexible sigmoidoscopy or colonoscopy. You may have a sigmoidoscopy every 5 years or a colonoscopy every 10 years starting at age 41.  Prostate cancer screening. Recommendations will vary depending on your family history and other risks.  Hepatitis C blood test.  Hepatitis B blood test.  Sexually transmitted disease (STD) testing.  Diabetes screening. This is done by checking your blood sugar (glucose) after you have not eaten for a while (fasting). You may have this done every 1-3 years.  Abdominal aortic aneurysm (AAA) screening. You may need this if you are a current or former smoker.  Osteoporosis. You may be screened starting at age 35 if you are at high risk. Talk with your health care provider about your test results, treatment options, and if necessary, the need for more tests. Vaccines  Your health care provider may recommend certain vaccines, such as:  Influenza vaccine. This is recommended every year.  Tetanus, diphtheria, and acellular pertussis (Tdap, Td) vaccine. You may need a Td booster every 10 years.  Zoster vaccine. You may need this after age 17.  Pneumococcal 13-valent conjugate (PCV13) vaccine. One dose is recommended after age  80.  Pneumococcal polysaccharide (PPSV23) vaccine. One dose is recommended after age 80. Talk to your health care provider about which screenings and vaccines you need and how often you need them. This information is not intended to replace advice given to you by your health care provider. Make sure you discuss any questions you have with your health care provider. Document Released: 09/20/2015 Document Revised: 05/13/2016 Document Reviewed: 06/25/2015 Elsevier Interactive Patient Education  2017 [place] Prevention in the Home Falls can cause injuries. They can happen to people of all ages. There are many things you can do to make your home safe and to help prevent falls. What can I do on the outside of my home?  Regularly fix the edges of walkways and driveways and fix any cracks.  Remove anything that might make you trip as you walk through a door, such as a raised step or threshold.  Trim any bushes or trees on the path to your home.  Use bright outdoor lighting.  Clear any walking paths of anything that might make someone trip, such as rocks or tools.  Regularly check to see if handrails are loose or broken. Make sure that both sides of any steps have handrails.  Any raised decks and porches should have guardrails on the edges.  Have any leaves, snow, or ice cleared regularly.  Use sand or salt on walking paths during winter.  Clean up any spills in your garage right away. This includes oil or grease spills. What can I do in the bathroom?  Use night lights.  Install grab bars by the toilet and in the tub and shower. Do not use towel bars as grab bars.  Use non-skid mats or decals in the tub or shower.  If you need to sit down in the shower, use a plastic, non-slip stool.  Keep the floor dry. Clean up any water that spills on the floor as soon as it happens.  Remove soap buildup in the tub or shower regularly.  Attach bath mats securely with double-sided  non-slip rug tape.  Do not have throw rugs and other things on the floor that can make you trip. What can I do in the bedroom?  Use night lights.  Make sure that you have a light by your bed that is easy to reach.  Do not use any sheets or blankets that are too big for your bed. They should not hang down onto the floor.  Have a firm chair that has side arms. You can use this for support while you get dressed.  Do not have throw rugs and other things on the floor that can make you trip. What can I do in the kitchen?  Clean up any spills right away.  Avoid walking on wet floors.  Keep items that you use a lot in easy-to-reach places.  If you need to reach something above you, use a strong step stool that has a grab bar.  Keep electrical cords out of the way.  Do not use floor polish or wax that makes floors slippery. If you must use wax, use non-skid floor wax.  Do not have throw rugs and other things on the floor that  can make you trip. What can I do with my stairs?  Do not leave any items on the stairs.  Make sure that there are handrails on both sides of the stairs and use them. Fix handrails that are broken or loose. Make sure that handrails are as long as the stairways.  Check any carpeting to make sure that it is firmly attached to the stairs. Fix any carpet that is loose or worn.  Avoid having throw rugs at the top or bottom of the stairs. If you do have throw rugs, attach them to the floor with carpet tape.  Make sure that you have a light switch at the top of the stairs and the bottom of the stairs. If you do not have them, ask someone to add them for you. What else can I do to help prevent falls?  Wear shoes that:  Do not have high heels.  Have rubber bottoms.  Are comfortable and fit you well.  Are closed at the toe. Do not wear sandals.  If you use a stepladder:  Make sure that it is fully opened. Do not climb a closed stepladder.  Make sure that both  sides of the stepladder are locked into place.  Ask someone to hold it for you, if possible.  Clearly mark and make sure that you can see:  Any grab bars or handrails.  First and last steps.  Where the edge of each step is.  Use tools that help you move around (mobility aids) if they are needed. These include:  Canes.  Walkers.  Scooters.  Crutches.  Turn on the lights when you go into a dark area. Replace any light bulbs as soon as they burn out.  Set up your furniture so you have a clear path. Avoid moving your furniture around.  If any of your floors are uneven, fix them.  If there are any pets around you, be aware of where they are.  Review your medicines with your doctor. Some medicines can make you feel dizzy. This can increase your chance of falling. Ask your doctor what other things that you can do to help prevent falls. This information is not intended to replace advice given to you by your health care provider. Make sure you discuss any questions you have with your health care provider. Document Released: 06/20/2009 Document Revised: 01/30/2016 Document Reviewed: 09/28/2014 Elsevier Interactive Patient Education  2017 [employer].

## 2018-03-31 NOTE — Progress Notes (Signed)
Subjective:   Ernest James. is a 82 y.o. male who presents for Medicare Annual/Subsequent preventive examination.  Review of Systems:  N/A  Cardiac Risk Factors include: advanced age (>25men, >59 women);male gender;hypertension;dyslipidemia     Objective:    Vitals: BP (!) 148/62 (BP Location: Right Arm)   Pulse 61   Temp 98.8 F (37.1 C) (Oral)   Ht 5\' 10"  (1.778 m)   Wt 204 lb (92.5 kg)   BMI 29.27 kg/m   Body mass index is 29.27 kg/m.  Advanced Directives 03/31/2018 06/16/2017 03/31/2016  Does Patient Have a Medical Advance Directive? Yes Yes No  Type of Paramedic of [place];Living will [place];Living will -  Copy of [place] in Chart? No - copy requested - -  Would patient like information on creating a medical advance directive? - - No - patient declined information    Tobacco Social History   Tobacco Use  Smoking Status Never Smoker  Smokeless Tobacco Never Used     Counseling given: Not Answered   Clinical Intake:  Pre-visit preparation completed: Yes  Pain : 0-10 Pain Score: 3  Pain Type: Acute pain Pain Location: Foot Pain Orientation: Right Pain Descriptors / Indicators: Sore Pain Onset: In the past 7 days Pain Frequency: Intermittent(with walking)     Nutritional Status: BMI 25 -29 Overweight Nutritional Risks: None Diabetes: No  How often do you need to have someone help you when you read instructions, pamphlets, or other written materials from your doctor or pharmacy?: 1 - Never  Interpreter Needed?: No  Information entered by :: [facility], LPN  Past Medical History:  Diagnosis Date  . Diabetes mellitus without complication ([place])   . GERD (gastroesophageal reflux disease)   . Hypertension   . Phimosis   . Thyroid disease    Past Surgical History:  Procedure Laterality Date  . CIRCUMCISION    . COLONOSCOPY WITH PROPOFOL N/A 03/31/2016   Procedure:  COLONOSCOPY WITH PROPOFOL;  Surgeon: Lollie Sails, MD;  Location: [facility] ENDOSCOPY;  Service: Endoscopy;  Laterality: N/A;  . SINUS SURGERY  1994   on left side  . TONSILLECTOMY     Family History  Problem Relation Age of Onset  . Alzheimer's disease Mother   . CAD Father   . Diabetes Sister        type 2   Social History   Socioeconomic History  . Marital status: Married    Spouse name: Not on file  . Number of children: 3  . Years of education: Not on file  . Highest education level: Bachelor's degree (e.g., BA, AB, BS)  Occupational History  . 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 Financial resource strain: Not hard at all  . Food insecurity:    Worry: Never true    Inability: Never true  . Transportation needs:    Medical: No    Non-medical: No  Tobacco Use  . Smoking status: Never Smoker  . Smokeless tobacco: Never Used  Substance and Sexual Activity  . Alcohol use: No    Alcohol/week: 0.0 oz  . Drug use: No  . Sexual activity: Not on file  Lifestyle  . Physical activity:    Days per week: Not on file    Minutes per session: Not on file  . Stress: Not at all  Relationships  . Social connections:    Talks on phone: Not on file  Gets together: Not on file    Attends religious service: Not on file    Active member of club or organization: Not on file    Attends meetings of clubs or organizations: Not on file    Relationship status: Not on file  Other Topics Concern  . Not on file  Social History Narrative  . Not on file    Outpatient Encounter Medications as of 03/31/2018  Medication Sig  . aspirin EC 81 MG tablet Take 81 mg by mouth daily.  Marland Kitchen atorvastatin (LIPITOR) 40 MG tablet TAKE 1 TABLET BY MOUTH ONCE DAILY  . levothyroxine (SYNTHROID, LEVOTHROID) 75 MCG tablet TAKE 1 TABLET BY MOUTH ONCE DAILY BEFORE BREAKFAST  . losartan (COZAAR) 25 MG tablet Take 1 tablet (25 mg total) by mouth daily.  Marland Kitchen omeprazole (PRILOSEC) 20 MG  capsule Take 20 mg by mouth daily.  . tamsulosin (FLOMAX) 0.4 MG CAPS capsule TAKE 1 CAPSULE BY MOUTH ONCE DAILY AFTER  SUPPER  . triamterene-hydrochlorothiazide (MAXZIDE) 75-50 MG tablet TAKE ONE TABLET BY MOUTH ONCE DAILY  . Cholecalciferol (VITAMIN D3) 20 MCG TABS Take 1 tablet by mouth once.  . Cyanocobalamin (VITAMIN B12) 1000 MCG TBCR Take by mouth.  . Multiple Vitamins-Minerals (MULTIVITAMIN ADULT PO) Take 1 tablet by mouth daily.   No facility-administered encounter medications on file as of 03/31/2018.     Activities of Daily Living In your present state of health, do you have any difficulty performing the following activities: 03/31/2018  Hearing? Y  Comment Does not wearing aids.  Vision? N  Difficulty concentrating or making decisions? N  Walking or climbing stairs? N  Dressing or bathing? N  Doing errands, shopping? N  Preparing Food and eating ? N  Using the Toilet? N  In the past six months, have you accidently leaked urine? N  Do you have problems with loss of bowel control? N  Managing your Medications? N  Managing your Finances? N  Housekeeping or managing your Housekeeping? N  Some recent data might be hidden    Patient Care Team: Birdie Sons, MD as PCP - General (Family Medicine) Samara Deist, DPM as Referring Physician (Podiatry)   Assessment:   This is a routine wellness examination for [place].  Exercise Activities and Dietary recommendations Current Exercise Habits: The patient does not participate in regular exercise at present, Exercise limited by: None identified  Goals    . DIET - INCREASE WATER INTAKE     Recommend increasing water intake to 4-6 glasses a day.        Fall Risk Fall Risk  03/31/2018 11/25/2016 11/22/2015  Falls in the past year? No No No   Is the patient's home free of loose throw rugs in walkways, pet beds, electrical cords, etc?   yes      Grab bars in the bathroom? yes      Handrails on the stairs?   yes       Adequate lighting?   yes  Timed Get Up and Go Performed: N/A  Depression Screen PHQ 2/9 Scores 03/31/2018 11/25/2016 11/22/2015  PHQ - 2 Score 0 0 0  PHQ- 9 Score - 0 1    Cognitive Function     6CIT Screen 03/31/2018  What Year? 0 points  What month? 0 points  What time? 0 points  Count back from 20 0 points  Months in reverse 0 points  Repeat phrase 2 points  Total Score 2    Immunization History  Administered  Date(s) Administered  . Influenza Split 07/24/2011  . Influenza, High Dose Seasonal PF 06/27/2015, 05/27/2016, 06/19/2017  . Influenza,inj,Quad PF,6+ Mos 07/08/2014  . Pneumococcal Conjugate-13 06/27/2015  . Pneumococcal Polysaccharide-23 06/30/2007  . Td 06/07/2001  . Tdap 07/08/2014  . Zoster 02/16/2012    Qualifies for Shingles Vaccine? Due for Shingles vaccine. Declined my offer to administer today. Education has been provided regarding the importance of this vaccine. Pt has been advised to call her insurance company to determine her out of pocket expense. Advised she may also receive this vaccine at her local pharmacy or Health Dept. Verbalized acceptance and understanding.  Screening Tests Health Maintenance  Topic Date Due  . INFLUENZA VACCINE  04/07/2018  . COLONOSCOPY  03/31/2021  . TETANUS/TDAP  07/08/2024  . PNA vac Low Risk Adult  Completed   Cancer Screenings: Lung: Low Dose CT Chest recommended if Age 28-80 years, 30 pack-year currently smoking OR have quit w/in 15years. Patient does not qualify. Colorectal: Up to date  Additional Screenings:  Hepatitis C Screening: N/A      Plan:  I have personally reviewed and addressed the Medicare Annual Wellness questionnaire and have noted the following in the patient's chart:  A. Medical and social history B. Use of alcohol, tobacco or illicit drugs  C. Current medications and supplements D. Functional ability and status E.  Nutritional status F.  Physical activity G. Advance directives H. List of  other physicians I.  Hospitalizations, surgeries, and ER visits in previous 12 months J.  [place] such as hearing and vision if needed, cognitive and depression L. Referrals and appointments - none  In addition, I have reviewed and discussed with patient certain preventive protocols, quality metrics, and best practice recommendations. A written personalized care plan for preventive services as well as general preventive health recommendations were provided to patient.  See attached scanned questionnaire for additional information.   Signed,  Fabio Neighbors, LPN Nurse Health Advisor   Nurse Recommendations: None.

## 2018-03-31 NOTE — Patient Instructions (Addendum)
   It is recommended to engage in 150 minutes of moderate exercise every week.    Avoid all sweets and white starchy foods, such as white breads and white potatoes.    The CDC recommends two doses of Shingrix (the shingles vaccine) separated by 2 to 6 months for adults age 80 years and older. I recommend checking with your insurance plan regarding coverage for this vaccine.

## 2018-04-01 ENCOUNTER — Telehealth: Payer: Self-pay

## 2018-04-01 ENCOUNTER — Other Ambulatory Visit: Payer: Self-pay | Admitting: Family Medicine

## 2018-04-01 DIAGNOSIS — I10 Essential (primary) hypertension: Secondary | ICD-10-CM

## 2018-04-01 LAB — LIPID PANEL
CHOLESTEROL TOTAL: 107 mg/dL (ref 100–199)
Chol/HDL Ratio: 2.7 ratio (ref 0.0–5.0)
HDL: 40 mg/dL (ref >39)
LDL CALC: 33 mg/dL (ref 0–99)
TRIGLYCERIDES: 169 mg/dL — AB (ref 0–149)
VLDL CHOLESTEROL CAL: 34 mg/dL (ref 5–40)

## 2018-04-01 LAB — COMPREHENSIVE METABOLIC PANEL
ALK PHOS: 47 IU/L (ref 39–117)
ALT: 28 IU/L (ref 0–44)
AST: 23 IU/L (ref 0–40)
Albumin/Globulin Ratio: 1.6 (ref 1.2–2.2)
Albumin: 4.2 g/dL (ref 3.5–4.7)
BUN/Creatinine Ratio: 10 (ref 10–24)
BUN: 13 mg/dL (ref 8–27)
Bilirubin Total: 0.6 mg/dL (ref 0.0–1.2)
CO2: 24 mmol/L (ref 20–29)
CREATININE: 1.24 mg/dL (ref 0.76–1.27)
Calcium: 9.9 mg/dL (ref 8.6–10.2)
Chloride: 100 mmol/L (ref 96–106)
GFR calc Af Amer: 63 mL/min/{1.73_m2} (ref >59)
GFR calc non Af Amer: 55 mL/min/{1.73_m2} — ABNORMAL LOW (ref >59)
GLUCOSE: 116 mg/dL — AB (ref 65–99)
Globulin, Total: 2.6 g/dL (ref 1.5–4.5)
Potassium: 3.4 mmol/L — ABNORMAL LOW (ref 3.5–5.2)
Sodium: 141 mmol/L (ref 134–144)
Total Protein: 6.8 g/dL (ref 6.0–8.5)

## 2018-04-01 LAB — TSH: TSH: 7.55 u[IU]/mL — ABNORMAL HIGH (ref 0.450–4.500)

## 2018-04-01 MED ORDER — LOSARTAN POTASSIUM 50 MG PO TABS: 50.0000 mg | ORAL_TABLET | Freq: Every day | ORAL | 1 refills | Status: DC

## 2018-04-01 NOTE — Telephone Encounter (Signed)
Advised patient as below.  

## 2018-04-01 NOTE — Telephone Encounter (Signed)
LMTCB

## 2018-04-01 NOTE — Progress Notes (Signed)
Increase losartan to 50

## 2018-04-01 NOTE — Telephone Encounter (Signed)
-----   Message from Birdie Sons, MD sent at 04/01/2018  8:02 AM EDT ----- Labs good. Go ahead an increase losartan to 50mg  daily, have sent new prescription to his pharmacy  Follow up in November as scheduled.

## 2018-04-11 DIAGNOSIS — H2513 Age-related nuclear cataract, bilateral: Secondary | ICD-10-CM | POA: Diagnosis not present

## 2018-04-11 LAB — HM DIABETES EYE EXAM

## 2018-04-12 ENCOUNTER — Encounter: Payer: Medicare Other | Attending: Family Medicine | Admitting: *Deleted

## 2018-04-12 ENCOUNTER — Encounter: Payer: Self-pay | Admitting: *Deleted

## 2018-04-12 VITALS — BP 140/82 | Ht 69.0 in | Wt 198.1 lb

## 2018-04-12 DIAGNOSIS — E119 Type 2 diabetes mellitus without complications: Secondary | ICD-10-CM | POA: Insufficient documentation

## 2018-04-12 DIAGNOSIS — Z713 Dietary counseling and surveillance: Secondary | ICD-10-CM | POA: Insufficient documentation

## 2018-04-12 NOTE — Patient Instructions (Signed)
Check blood sugars 2 x day before breakfast and 2 hrs after supper 3-4 x week Bring blood sugar records to the next class/appointment  Call your doctor for a prescription for:  1. Meter strips (type) One Touch Verio checking  3-4 times per week  2. Lancets (type) One Touch Delica checking  3-4  times per week  Exercise:  Begin walking for  15  minutes  3  days a week and gradually increase to 30 minutes 5 x week  Eat 3 meals day, 1-2 snacks a day Space meals 4-6 hours apart Don't skip meals  Call back to schedule classes or an appointment with the dietitian

## 2018-04-13 NOTE — Progress Notes (Signed)
Diabetes Self-Management Education  Visit Type: First/Initial  Appt. Start Time: 1545 Appt. End Time: 1700  04/12/2018  [name], identified by name and date of birth, is a 82 y.o. male with a diagnosis of Diabetes: Type 2.   ASSESSMENT  Blood pressure 140/82, height 5\' 9"  (1.753 m), weight 198 lb 1.6 oz (89.9 kg). Body mass index is 29.25 kg/m.  Diabetes Self-Management Education - 04/12/18 1717      Visit Information   Visit Type  First/Initial      Initial Visit   Diabetes Type  Type 2    Are you currently following a meal plan?  Yes    What type of meal plan do you follow?  "cutting down - limiting sweets and starch"    Are you taking your medications as prescribed?  Yes taking prescribed medications but not vitamins    Date Diagnosed  Pt thought he had pre-diabetes - A1C 7.1 % - labs from 11/25/16 6.5 %      Health Coping   How would you rate your overall health?  Good      Psychosocial Assessment   Patient Belief/Attitude about Diabetes  Motivated to manage diabetes    Self-care barriers  None    Self-management support  Doctor's office;Family    Patient Concerns  Nutrition/Meal planning;Glycemic Control;Weight Control;Monitoring;Medication;Healthy Lifestyle    Special Needs  None    Preferred Learning Style  Auditory    Learning Readiness  Change in progress    How often do you need to have someone help you when you read instructions, pamphlets, or other written materials from your doctor or pharmacy?  1 - Never    What is the last grade level you completed in school?  college      Pre-Education Assessment   Patient understands the diabetes disease and treatment process.  Needs Instruction    Patient understands incorporating nutritional management into lifestyle.  Needs Instruction    Patient undertands incorporating physical activity into lifestyle.  Needs Instruction    Patient understands using medications safely.  Needs Instruction    Patient  understands monitoring blood glucose, interpreting and using results  Needs Instruction    Patient understands prevention, detection, and treatment of acute complications.  Needs Instruction    Patient understands prevention, detection, and treatment of chronic complications.  Needs Instruction    Patient understands how to develop strategies to address psychosocial issues.  Needs Instruction    Patient understands how to develop strategies to promote health/change behavior.  Needs Instruction      Complications   Last HgB A1C per patient/outside source  7.1 % 03/31/18    How often do you check your blood sugar?  0 times/day (not testing) Provided One Touch Verio Flex meter and instructed on use. BG upon return demonstration was 93 mg/dL at 4:45 pm - 6 1/2 hrs pp.     Have you had a dilated eye exam in the past 12 months?  Yes    Have you had a dental exam in the past 12 months?  Yes    Are you checking your feet?  No      Dietary Intake   Breakfast  eggs or 2 flavored oatmeal packets or [place] biscuit with [place] sausage, egg and mayo    Lunch  skips or meat and cheese sandwich on wheat bread with occasional chips    Dinner  chicken, beef, pork, occasional fish - potatoes, peas, beans, corn, rice, pasta,  salads, tomatoes, carrots, broccoli, cauliflower, green beans    Beverage(s)  water, coffee with artificial sweetened, diet drinks      Exercise   Exercise Type  ADL's      Patient Education   Previous Diabetes Education  No    Disease state   Definition of diabetes, type 1 and 2, and the diagnosis of diabetes    Nutrition management   Role of diet in the treatment of diabetes and the relationship between the three main macronutrients and blood glucose level;Food label reading, portion sizes and measuring food.;Reviewed blood glucose goals for pre and post meals and how to evaluate the patients' food intake on their blood glucose level.    Physical activity and exercise   Role of  exercise on diabetes management, blood pressure control and cardiac health.    Medications  Reviewed patients medication for diabetes, action, purpose, timing of dose and side effects.    Monitoring  Taught/evaluated SMBG meter.;Purpose and frequency of SMBG.;Taught/discussed recording of test results and interpretation of SMBG.;Identified appropriate SMBG and/or A1C goals.    Chronic complications  Relationship between chronic complications and blood glucose control    Psychosocial adjustment  Identified and addressed patients feelings and concerns about diabetes      Individualized Goals (developed by patient)   Reducing Risk  Improve blood sugars Decrease medications Prevent diabetes complications Lose weight Lead a healthier lifestyle     Outcomes   Expected Outcomes  Demonstrated interest in learning. Expect positive outcomes    Future DMSE  Needs to check with insurance before scheduling next appointment      Individualized Plan for Diabetes Self-Management Training:   Learning Objective:  Patient will have a greater understanding of diabetes self-management. Patient education plan is to attend individual and/or group sessions per assessed needs and concerns.   Plan:   Patient Instructions  Check blood sugars 2 x day before breakfast and 2 hrs after supper 3-4 x week Bring blood sugar records to the next class/appointment Call your doctor for a prescription for:  1. Meter strips (type) One Touch Verio checking  3-4 times per week  2. Lancets (type) One Touch Delica checking  3-4  times per week Exercise:  Begin walking for  15  minutes  3  days a week and gradually increase to 30 minutes 5 x week Eat 3 meals day, 1-2 snacks a day Space meals 4-6 hours apart Don't skip meals Call back to schedule classes or an appointment with the dietitian  Expected Outcomes:  Demonstrated interest in learning. Expect positive outcomes  Education material provided: General Meal Planning  Guidelines Simple Meal Plan Meter = One Touch Verio Flex  If problems or questions, patient to contact team via:  Ernest Drilling, RN, CCM, CDE [phone]  Future DSME appointment:  Pt to check with his insurance and will call back to schedule classes or 1:1 appointment with the dietitian

## 2018-04-15 ENCOUNTER — Telehealth: Payer: Self-pay | Admitting: *Deleted

## 2018-04-15 ENCOUNTER — Encounter: Payer: Self-pay | Admitting: *Deleted

## 2018-04-15 NOTE — Telephone Encounter (Signed)
Received phone call from patient. He will need to come to Diabetes classes in Sept or Oct. He will start Thursday morning series beginning Sept 26.

## 2018-05-13 ENCOUNTER — Other Ambulatory Visit: Payer: Self-pay | Admitting: Family Medicine

## 2018-06-02 ENCOUNTER — Encounter: Payer: Self-pay | Admitting: Dietician

## 2018-06-02 ENCOUNTER — Encounter: Payer: Medicare Other | Attending: Family Medicine | Admitting: Dietician

## 2018-06-02 VITALS — Wt 197.0 lb

## 2018-06-02 DIAGNOSIS — Z713 Dietary counseling and surveillance: Secondary | ICD-10-CM | POA: Insufficient documentation

## 2018-06-02 DIAGNOSIS — E119 Type 2 diabetes mellitus without complications: Secondary | ICD-10-CM | POA: Diagnosis not present

## 2018-06-02 NOTE — Progress Notes (Signed)

## 2018-06-09 ENCOUNTER — Ambulatory Visit: Payer: Medicare Other

## 2018-06-09 ENCOUNTER — Telehealth: Payer: Self-pay | Admitting: *Deleted

## 2018-06-09 NOTE — Telephone Encounter (Signed)
Patient cancelled Diabetes Class 2 today. Called and left a message on his home phone number to come to Class 3 next week and we can reschedule Class 2 then. Attempted to call mobile phone but his mailbox was full and not able to leave a message.

## 2018-06-16 ENCOUNTER — Encounter: Payer: Self-pay | Admitting: Dietician

## 2018-06-16 ENCOUNTER — Ambulatory Visit: Payer: Medicare Other

## 2018-06-16 NOTE — Progress Notes (Addendum)
Patient cancelled his attendance for class 3 today. He needs to make up classes 2 and 3 to complete the DSME program. Called his preferred phone (mobile); unable to leave a message as the mailbox is full.

## 2018-06-26 ENCOUNTER — Other Ambulatory Visit: Payer: Self-pay | Admitting: Family Medicine

## 2018-06-27 ENCOUNTER — Ambulatory Visit (INDEPENDENT_AMBULATORY_CARE_PROVIDER_SITE_OTHER): Payer: Medicare Other

## 2018-06-27 ENCOUNTER — Encounter (INDEPENDENT_AMBULATORY_CARE_PROVIDER_SITE_OTHER): Payer: Medicare Other

## 2018-06-27 ENCOUNTER — Ambulatory Visit (INDEPENDENT_AMBULATORY_CARE_PROVIDER_SITE_OTHER): Payer: Medicare Other | Admitting: Vascular Surgery

## 2018-06-27 ENCOUNTER — Encounter (INDEPENDENT_AMBULATORY_CARE_PROVIDER_SITE_OTHER): Payer: Self-pay | Admitting: Vascular Surgery

## 2018-06-27 VITALS — BP 138/78 | HR 73 | Resp 16 | Ht 69.5 in | Wt 195.9 lb

## 2018-06-27 DIAGNOSIS — E782 Mixed hyperlipidemia: Secondary | ICD-10-CM | POA: Diagnosis not present

## 2018-06-27 DIAGNOSIS — I6523 Occlusion and stenosis of bilateral carotid arteries: Secondary | ICD-10-CM | POA: Diagnosis not present

## 2018-06-27 DIAGNOSIS — E119 Type 2 diabetes mellitus without complications: Secondary | ICD-10-CM | POA: Diagnosis not present

## 2018-06-27 DIAGNOSIS — G459 Transient cerebral ischemic attack, unspecified: Secondary | ICD-10-CM

## 2018-06-28 ENCOUNTER — Encounter (INDEPENDENT_AMBULATORY_CARE_PROVIDER_SITE_OTHER): Payer: Self-pay | Admitting: Vascular Surgery

## 2018-06-28 NOTE — Progress Notes (Signed)
Subjective:    Patient ID: Ernest Reef [name]., male    DOB: 1936/06/06, 82 y.o.   MRN: [id number] Chief Complaint  Patient presents with  . Carotid    81month follow up   Patient presents for a non-invasive six month study follow up for carotid stenosis. The stenosis has been followed by surveillance duplexes. The patient underwent a bilateral carotid duplex scan which showed improvement from the previous exam on 12/16/17.  The patient has not undergone any intervention however the previous ultrasounder overestimated his carotid stenosis.  Duplex is notabe at 1-39% Right ICA stenosis and Left ICA stenosis (40-59%).  Bilateral vertebral arteries are antegrade.  Bilateral subclavian arteries are multiphasic and within normal limits.  The patient notes experiencing what sounds like TIA like symptoms twice in his lifetime once approximately 8 years ago and once approximately 8 months ago.  He describes his symptoms as him becoming "spacey" without any permanent effects. The patient denies experiencing Amaurosis Fugax, recent TIA like symptoms or focal motor deficits.  We discussed if he were to experience any new/recent TIA like symptoms the next step would be a CTA of the neck.  I will see the patient back in 6 months for repeat carotid duplex.  If the patient is TIA symptom-free and his carotid duplex is stable we can possibly move his follow-up out yearly.  The patient knows to call the office sooner if he should experience any TIA-like symptoms.  Review of Systems  Constitutional: Negative.   HENT: Negative.   Eyes: Negative.   Respiratory: Negative.   Cardiovascular:       Carotid Stenosis  Gastrointestinal: Negative.   Endocrine: Negative.   Genitourinary: Negative.   Musculoskeletal: Negative.   Skin: Negative.   Allergic/Immunologic: Negative.   Neurological: Negative.   Hematological: Negative.   Psychiatric/Behavioral: Negative.       Objective:   Physical Exam    Constitutional: He is oriented to person, place, and time. He appears well-developed and well-nourished. No distress.  HENT:  Head: Normocephalic and atraumatic.  Right Ear: External ear normal.  Left Ear: External ear normal.  Eyes: Pupils are equal, round, and reactive to light. EOM are normal.  Neck: Normal range of motion.  Carotid bruits noted  Cardiovascular: Normal rate, regular rhythm, normal heart sounds and intact distal pulses.  Pulses:      Radial pulses are 2+ on the right side, and 2+ on the left side.  Pulmonary/Chest: Effort normal and breath sounds normal.  Musculoskeletal: Normal range of motion. He exhibits no edema.  Neurological: He is alert and oriented to person, place, and time.  Skin: Skin is warm and dry. He is not diaphoretic.  Psychiatric: He has a normal mood and affect. His behavior is normal. Judgment and thought content normal.  Vitals reviewed.  BP 138/78 (BP Location: Left Arm)   Pulse 73   Resp 16   Ht 5' 9.5" (1.765 m)   Wt 195 lb 13.9 oz (88.8 kg)   BMI 28.51 kg/m   Past Medical History:  Diagnosis Date  . Diabetes mellitus without complication ([place])   . GERD (gastroesophageal reflux disease)   . Hypertension   . Phimosis   . Thyroid disease    Social History   Socioeconomic History  . Marital status: Married    Spouse name: Not on file  . Number of children: 3  . Years of education: Not on file  . Highest education level: Bachelor's degree (e.g., BA,  AB, BS)  Occupational History  . 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 Financial resource strain: Not hard at all  . Food insecurity:    Worry: Never true    Inability: Never true  . Transportation needs:    Medical: No    Non-medical: No  Tobacco Use  . Smoking status: Never Smoker  . Smokeless tobacco: Never Used  Substance and Sexual Activity  . Alcohol use: No    Alcohol/week: 0.0 standard drinks  . Drug use: No  . Sexual activity: Not on file   Lifestyle  . Physical activity:    Days per week: Not on file    Minutes per session: Not on file  . Stress: Not at all  Relationships  . Social connections:    Talks on phone: Not on file    Gets together: Not on file    Attends religious service: Not on file    Active member of club or organization: Not on file    Attends meetings of clubs or organizations: Not on file    Relationship status: Not on file  . Intimate partner violence:    Fear of current or ex partner: Not on file    Emotionally abused: Not on file    Physically abused: Not on file    Forced sexual activity: Not on file  Other Topics Concern  . Not on file  Social History Narrative  . Not on file   Past Surgical History:  Procedure Laterality Date  . CIRCUMCISION    . COLONOSCOPY WITH PROPOFOL N/A 03/31/2016   Procedure: COLONOSCOPY WITH PROPOFOL;  Surgeon: Lollie Sails, MD;  Location: [facility] ENDOSCOPY;  Service: Endoscopy;  Laterality: N/A;  . SINUS SURGERY  1994   on left side  . TONSILLECTOMY     Family History  Problem Relation Age of Onset  . Alzheimer's disease Mother   . CAD Father   . Diabetes Sister        type 2   No Known Allergies     Assessment & Plan:  Patient presents for a non-invasive six month study follow up for carotid stenosis. The stenosis has been followed by surveillance duplexes. The patient underwent a bilateral carotid duplex scan which showed improvement from the previous exam on 12/16/17.  The patient has not undergone any intervention however the previous ultrasounder overestimated his carotid stenosis.  Duplex is notabe at 1-39% Right ICA stenosis and Left ICA stenosis (40-59%).  Bilateral vertebral arteries are antegrade.  Bilateral subclavian arteries are multiphasic and within normal limits.  The patient notes experiencing what sounds like TIA like symptoms twice in his lifetime once approximately 8 years ago and once approximately 8 months ago.  He describes his  symptoms as him becoming "spacey" without any permanent effects. The patient denies experiencing Amaurosis Fugax, recent TIA like symptoms or focal motor deficits.  We discussed if he were to experience any new/recent TIA like symptoms the next step would be a CTA of the neck.  I will see the patient back in 6 months for repeat carotid duplex.  If the patient is TIA symptom-free and his carotid duplex is stable we can possibly move his follow-up out yearly.  The patient knows to call the office sooner if he should experience any TIA-like symptoms.  1. Bilateral carotid artery stenosis - Stable Studies reviewed with patient. Patient asymptomatic with stable duplex.  No intervention at this time.  Patient to return  in six months for surveillance carotid duplex. Patient to continue medical optimization with ASA and dyslipidemia medication. Patient to remain abstinent of tobacco use. I have discussed with the patient at length the risk factors for and pathogenesis of atherosclerotic disease and encouraged a healthy diet, regular exercise regimen and blood pressure / glucose control.  Patient was instructed to contact our office in the interim with problems such as arm / leg weakness or numbness, speech / swallowing difficulty or temporary monocular blindness. The patient expresses their understanding.   - VAS US CAROTID; Future  2. TIA (transient ischemic attack) - Stable States he has only had 2 "episodes" in his lifetime One approximately 8 years ago and one approximately 8 months ago The patient knows to call our office if he experiences a recent one in the next step is a CTA of the neck He expresses understanding  3. Diabetes mellitus without complication ([place]) - Stable Encouraged good control as its slows the progression of atherosclerotic disease  4. Mixed hyperlipidemia - Stable Encouraged good control as its slows the progression of atherosclerotic disease  Current Outpatient  Medications on File Prior to Visit  Medication Sig Dispense Refill  . aspirin EC 81 MG tablet Take 81 mg by mouth daily.    Marland Kitchen atorvastatin (LIPITOR) 40 MG tablet TAKE 1 TABLET BY MOUTH ONCE DAILY 30 tablet 12  . Cholecalciferol (VITAMIN D3) 20 MCG TABS Take 1 tablet by mouth daily.     . Cyanocobalamin (VITAMIN B12) 1000 MCG TBCR Take 1 tablet by mouth daily.     Marland Kitchen levothyroxine (SYNTHROID, LEVOTHROID) 75 MCG tablet TAKE 1 TABLET BY MOUTH ONCE DAILY BEFORE BREAKFAST 90 tablet 4  . losartan (COZAAR) 50 MG tablet Take 1 tablet (50 mg total) by mouth daily. 90 tablet 1  . metFORMIN (GLUCOPHAGE-XR) 500 MG 24 hr tablet TAKE 1 TABLET BY MOUTH ONCE DAILY WITH BREAKFAST 90 tablet 4  . Multiple Vitamins-Minerals (MULTIVITAMIN ADULT PO) Take 1 tablet by mouth daily.    Marland Kitchen omeprazole (PRILOSEC) 20 MG capsule Take 20 mg by mouth daily.    . tamsulosin (FLOMAX) 0.4 MG CAPS capsule TAKE 1 CAPSULE BY MOUTH ONCE DAILY AFTER  SUPPER 90 capsule 4  . triamterene-hydrochlorothiazide (MAXZIDE) 75-50 MG tablet TAKE ONE TABLET BY MOUTH ONCE DAILY 90 tablet 4   No current facility-administered medications on file prior to visit.    There are no Patient Instructions on file for this visit. No follow-ups on file.  Nylia Gavina A Moya Duan, PA-C

## 2018-07-05 ENCOUNTER — Ambulatory Visit (INDEPENDENT_AMBULATORY_CARE_PROVIDER_SITE_OTHER): Payer: Medicare Other | Admitting: Family Medicine

## 2018-07-05 ENCOUNTER — Encounter: Payer: Self-pay | Admitting: Family Medicine

## 2018-07-05 VITALS — BP 138/60 | HR 51 | Temp 98.2°F | Resp 15 | Wt 196.4 lb

## 2018-07-05 DIAGNOSIS — Z23 Encounter for immunization: Secondary | ICD-10-CM

## 2018-07-05 DIAGNOSIS — Z86010 Personal history of colonic polyps: Secondary | ICD-10-CM | POA: Diagnosis not present

## 2018-07-05 DIAGNOSIS — I6523 Occlusion and stenosis of bilateral carotid arteries: Secondary | ICD-10-CM

## 2018-07-05 DIAGNOSIS — R194 Change in bowel habit: Secondary | ICD-10-CM

## 2018-07-05 DIAGNOSIS — Z860101 Personal history of adenomatous and serrated colon polyps: Secondary | ICD-10-CM

## 2018-07-05 NOTE — Progress Notes (Signed)
Patient: Ernest James. Male    DOB: 1935/11/27   82 y.o.   MRN: [id number] Visit Date: 07/05/2018  Today's Provider: Lelon Huh, MD   Chief Complaint  Patient presents with  . Hemorrhoids   Subjective:    HPI   Patient is present in office today with concerns of possible internal hemorrhoids. Patient states that he began to had difficulty passing bowel movements 6 days ago. He had urge to go but had to take several laxatives and suppositories before having BM. BM was soft and has had several normal BM since then. No blood in stool. He thought he felt hemorrhoid at one point and is concerned it may be blocking stool. Had a little bit of stinging that day, but not in the last few days.   He does have history colon polyps and had several adenomas removed by Dr. Gustavo Lah in July  2017 and is due for follow up colonoscopy now. His wife was recently seen by Dr. Vicente Males and patient would like to be referred to him.   No Known Allergies   Current Outpatient Medications:  .  aspirin EC 81 MG tablet, Take 81 mg by mouth daily., Disp: , Rfl:  .  atorvastatin (LIPITOR) 40 MG tablet, TAKE 1 TABLET BY MOUTH ONCE DAILY, Disp: 30 tablet, Rfl: 12 .  Cholecalciferol (VITAMIN D3) 20 MCG TABS, Take 1 tablet by mouth daily. , Disp: , Rfl:  .  Cyanocobalamin (VITAMIN B12) 1000 MCG TBCR, Take 1 tablet by mouth daily. , Disp: , Rfl:  .  levothyroxine (SYNTHROID, LEVOTHROID) 75 MCG tablet, TAKE 1 TABLET BY MOUTH ONCE DAILY BEFORE BREAKFAST, Disp: 90 tablet, Rfl: 4 .  losartan (COZAAR) 50 MG tablet, Take 1 tablet (50 mg total) by mouth daily., Disp: 90 tablet, Rfl: 1 .  metFORMIN (GLUCOPHAGE-XR) 500 MG 24 hr tablet, TAKE 1 TABLET BY MOUTH ONCE DAILY WITH BREAKFAST, Disp: 90 tablet, Rfl: 4 .  Multiple Vitamins-Minerals (MULTIVITAMIN ADULT PO), Take 1 tablet by mouth daily., Disp: , Rfl:  .  omeprazole (PRILOSEC) 20 MG capsule, Take 20 mg by mouth daily., Disp: , Rfl:  .  tamsulosin  (FLOMAX) 0.4 MG CAPS capsule, TAKE 1 CAPSULE BY MOUTH ONCE DAILY AFTER  SUPPER, Disp: 90 capsule, Rfl: 4 .  triamterene-hydrochlorothiazide (MAXZIDE) 75-50 MG tablet, TAKE ONE TABLET BY MOUTH ONCE DAILY, Disp: 90 tablet, Rfl: 4  Review of Systems  Constitutional: Negative.   Eyes: Negative.   Respiratory: Negative.   Cardiovascular: Negative.   Gastrointestinal: Positive for rectal pain. Negative for abdominal distention, abdominal pain, anal bleeding, blood in stool, constipation, diarrhea, nausea and vomiting.  Endocrine: Negative.   Genitourinary: Negative.   Musculoskeletal: Negative.   Skin: Negative.   Allergic/Immunologic: Negative.   Neurological: Negative.   Hematological: Negative.   Psychiatric/Behavioral: Negative.     Social History   Tobacco Use  . Smoking status: Never Smoker  . Smokeless tobacco: Never Used  Substance Use Topics  . Alcohol use: No    Alcohol/week: 0.0 standard drinks   Objective:   BP 138/60   Pulse (!) 51   Temp 98.2 F (36.8 C) (Oral)   Resp 15   Wt 196 lb 6.4 oz (89.1 kg)   SpO2 97%   BMI 28.59 kg/m  Vitals:   07/05/18 0818  BP: 138/60  Pulse: (!) 51  Resp: 15  Temp: 98.2 F (36.8 C)  TempSrc: Oral  SpO2: 97%  Weight: 196 lb 6.4  oz (89.1 kg)     Physical Exam  General appearance: alert, well developed, well nourished, cooperative and in no distress Head: Normocephalic, without obvious abnormality, atraumatic Respiratory: Respirations even and unlabored, normal respiratory rate Extremities: No gross deformities Skin: Skin color, texture, turgor normal. No rashes seen  Psych: Appropriate mood and affect. Neurologic: Mental status: Alert, oriented to person, place, and time, thought content appropriate.     Assessment & Plan:     1. Bowel habit changes He does have history of multiple colon polyps, which may have interfered with BM. Previously followed by Dr. Gustavo Lah, but he prefers to be referred to Dr. Vicente Males.  -  Ambulatory referral to Gastroenterology  2. History of adenomatous polyp of colon  - Ambulatory referral to Gastroenterology  3. Need for influenza vaccination  - Flu vaccine HIGH DOSE PF       Lelon Huh, MD  [facility]

## 2018-07-19 ENCOUNTER — Encounter: Payer: Self-pay | Admitting: *Deleted

## 2018-07-26 ENCOUNTER — Ambulatory Visit: Payer: Medicare Other | Admitting: Family Medicine

## 2018-08-23 ENCOUNTER — Ambulatory Visit (INDEPENDENT_AMBULATORY_CARE_PROVIDER_SITE_OTHER): Payer: Medicare Other | Admitting: Gastroenterology

## 2018-08-23 ENCOUNTER — Encounter: Payer: Self-pay | Admitting: Gastroenterology

## 2018-08-23 VITALS — BP 123/71 | HR 69 | Ht 69.5 in | Wt 200.0 lb

## 2018-08-23 DIAGNOSIS — R194 Change in bowel habit: Secondary | ICD-10-CM | POA: Diagnosis not present

## 2018-08-23 DIAGNOSIS — I6523 Occlusion and stenosis of bilateral carotid arteries: Secondary | ICD-10-CM

## 2018-08-23 NOTE — Progress Notes (Signed)
Ernest Bellows MD, MRCP(U.K) [address]  [place]  [place], [place] [address]  Main: [phone]  Fax: [phone]   Gastroenterology Consultation  Referring Provider:     Birdie Sons, MD Primary Care Physician:  Birdie Sons, MD Primary Gastroenterologist:  Dr. Jonathon James  Reason for Consultation:     Change in bowel habits        HPI:   Ernest Crumble Herschell Orton Capell. is a 82 y.o. y/o male referred for consultation & management  by Dr. Caryn Section, Kirstie Peri, MD.     He is here to see me for a change in bowel habits.Last colonoscopy was back in 2017 by Dr Gustavo Lah. 6 polyps resected. Multiple tubular adenomas.   No recent labs. He says that he had an episode of constipation on a single occasion which was so severe at that time. This occurred 3 months back. Not recurred. No rectal bleeding, weight loss, family history colon cancer.   Presently bowel movements are regular.   Past Medical History:  Diagnosis Date  . Diabetes mellitus without complication ([place])   . GERD (gastroesophageal reflux disease)   . Hypertension   . Phimosis   . Thyroid disease     Past Surgical History:  Procedure Laterality Date  . CIRCUMCISION    . COLONOSCOPY WITH PROPOFOL N/A 03/31/2016   Procedure: COLONOSCOPY WITH PROPOFOL;  Surgeon: Lollie Sails, MD;  Location: [facility] ENDOSCOPY;  Service: Endoscopy;  Laterality: N/A;  . SINUS SURGERY  1994   on left side  . TONSILLECTOMY      Prior to Admission medications   Medication Sig Start Date End Date Taking? Authorizing Provider  aspirin EC 81 MG tablet Take 81 mg by mouth daily.   Yes [provider]  atorvastatin (LIPITOR) 40 MG tablet TAKE 1 TABLET BY MOUTH ONCE DAILY 05/13/18  Yes Birdie Sons, MD  levothyroxine (SYNTHROID, LEVOTHROID) 75 MCG tablet TAKE 1 TABLET BY MOUTH ONCE DAILY BEFORE BREAKFAST 11/10/17  Yes Birdie Sons, MD  losartan (COZAAR) 50 MG tablet Take 1 tablet (50 mg total) by mouth daily. 04/01/18   Yes Birdie Sons, MD  metFORMIN (GLUCOPHAGE-XR) 500 MG 24 hr tablet TAKE 1 TABLET BY MOUTH ONCE DAILY WITH BREAKFAST 06/26/18  Yes Birdie Sons, MD  omeprazole (PRILOSEC) 20 MG capsule Take 20 mg by mouth daily.   Yes [provider]  tamsulosin (FLOMAX) 0.4 MG CAPS capsule TAKE 1 CAPSULE BY MOUTH ONCE DAILY AFTER  SUPPER 01/14/18  Yes Fisher, Kirstie Peri, MD  triamterene-hydrochlorothiazide (MAXZIDE) 75-50 MG tablet TAKE ONE TABLET BY MOUTH ONCE DAILY 10/11/17  Yes Birdie Sons, MD  Cholecalciferol (VITAMIN D3) 20 MCG TABS Take 1 tablet by mouth daily.     [provider]  Cyanocobalamin (VITAMIN B12) 1000 MCG TBCR Take 1 tablet by mouth daily.     [provider]  Multiple Vitamins-Minerals (MULTIVITAMIN ADULT PO) Take 1 tablet by mouth daily. 03/20/09   [provider]    Family History  Problem Relation Age of Onset  . Alzheimer's disease Mother   . CAD Father   . Diabetes Sister        type 2     Social History   Tobacco Use  . Smoking status: Never Smoker  . Smokeless tobacco: Never Used  Substance Use Topics  . Alcohol use: No    Alcohol/week: 0.0 standard drinks  . Drug use: No    Allergies as of 08/23/2018  . (  No Known Allergies)    Review of Systems:    All systems reviewed and negative except where noted in HPI.   Physical Exam:  BP 123/71   Pulse 69   Ht 5' 9.5" (1.765 m)   Wt 200 lb (90.7 kg)   BMI 29.11 kg/m  No LMP for male patient. Psych:  Alert and cooperative. Normal mood and affect. General:   Alert,  Well-developed, well-nourished, pleasant and cooperative in NAD Head:  Normocephalic and atraumatic. Eyes:  Sclera clear, no icterus.   Conjunctiva pink. Ears:  Normal auditory acuity. Nose:  No deformity, discharge, or lesions. Mouth:  No deformity or lesions,oropharynx pink & moist. Neck:  Supple; no masses or thyromegaly. Lungs:  Respirations even and unlabored.  Clear throughout to auscultation.   No  wheezes, crackles, or rhonchi. No acute distress. Heart:  Regular rate and rhythm; no murmurs, clicks, rubs, or gallops. Abdomen:  Normal bowel sounds.  No bruits.  Soft, non-tender and non-distended without masses, hepatosplenomegaly or hernias noted.  No guarding or rebound tenderness.    Msk:  Symmetrical without gross deformities. Good, equal movement & strength bilaterally. Pulses:  Normal pulses noted. Extremities:  No clubbing or edema.  No cyanosis. Neurologic:  Alert and oriented x3;  grossly normal neurologically. Skin:  Intact without significant lesions or rashes. No jaundice. Lymph Nodes:  No significant cervical adenopathy. Psych:  Alert and cooperative. Normal mood and affect.  Imaging Studies: No results found.  Assessment and Plan:   Ernest Reef Brayen Bunn. is a 82 y.o. y/o male has been referred for change in bowle habits. He had 1 single episode of constipation , none since last 87months Last colonoscopy 2.5 years back . Does not really have any other red flag signs. If he has recurrence in issues with constipation he will call and will proceed with colonoscopy. Explained always a decision of risks vs relative benefits.    Follow up in PRN  Dr Ernest Bellows MD,MRCP(U.K)

## 2018-09-09 DIAGNOSIS — J019 Acute sinusitis, unspecified: Secondary | ICD-10-CM | POA: Diagnosis not present

## 2018-09-09 DIAGNOSIS — J329 Chronic sinusitis, unspecified: Secondary | ICD-10-CM | POA: Diagnosis not present

## 2018-10-07 ENCOUNTER — Other Ambulatory Visit: Payer: Self-pay | Admitting: Family Medicine

## 2018-10-07 ENCOUNTER — Ambulatory Visit (INDEPENDENT_AMBULATORY_CARE_PROVIDER_SITE_OTHER): Payer: Medicare Other | Admitting: Physician Assistant

## 2018-10-07 ENCOUNTER — Encounter: Payer: Self-pay | Admitting: Physician Assistant

## 2018-10-07 VITALS — BP 132/72 | HR 80 | Temp 98.2°F | Resp 16 | Wt 198.0 lb

## 2018-10-07 DIAGNOSIS — J029 Acute pharyngitis, unspecified: Secondary | ICD-10-CM

## 2018-10-07 DIAGNOSIS — J040 Acute laryngitis: Secondary | ICD-10-CM

## 2018-10-07 DIAGNOSIS — I10 Essential (primary) hypertension: Secondary | ICD-10-CM

## 2018-10-07 LAB — POCT RAPID STREP A (OFFICE): Rapid Strep A Screen: NEGATIVE

## 2018-10-07 MED ORDER — PREDNISONE 10 MG PO TABS: 10.0000 mg | ORAL_TABLET | Freq: Every day | ORAL | 0 refills | Status: AC

## 2018-10-07 NOTE — Patient Instructions (Signed)
Laryngitis  Laryngitis is irritation and swelling (inflammation) of your vocal cords. This condition causes symptoms such as:  · A change in your voice. It may sound low and hoarse.  · Loss of voice.  · Coughing.  · Sore throat.  · Dry throat.  · Stuffy nose.  Depending on the cause, this condition may go away after a short time or may last for more than 3 weeks. Treatment often involves resting your voice and using medicines to soothe your throat.  Follow these instructions at home:  Medicines  · Take over-the-counter and prescription medicines only as told by your doctor.  · If you were prescribed an antibiotic medicine, take it as told by your doctor. Do not stop taking it even if you start to feel better.  General instructions  · Talk as little as possible. Also avoid whispering.  · Write instead of talking. Do this until your voice is back to normal.  · Drink enough fluid to keep your pee (urine) pale yellow.  · Breathe in moist air. Use a humidifier if you live in a dry climate.  · Do not use any products that have nicotine or tobacco in them, such as cigarettes and e-cigarettes. If you need help quitting, ask your doctor.  Contact a doctor if:  · You have a fever.  · Your pain is worse.  · Your symptoms do not get better in 2 weeks.  Get help right away if:  · You cough up blood.  · You have trouble swallowing.  · You have trouble breathing.  Summary  · Laryngitis is inflammation of your vocal cords.  · This condition causes your voice to sound low and hoarse.  · Rest your voice by talking as little as possible. Also avoid whispering.  This information is not intended to replace advice given to you by your health care provider. Make sure you discuss any questions you have with your health care provider.  Document Released: 08/13/2011 Document Revised: 08/11/2017 Document Reviewed: 08/11/2017  Elsevier Interactive Patient Education © 2019 [employer]

## 2018-10-07 NOTE — Progress Notes (Signed)
Patient: Ernest James. Male    DOB: 13-Apr-1936   83 y.o.   MRN: [id number] Visit Date: 10/07/2018  Today's Provider: Trinna Post, PA-C   Chief Complaint  Patient presents with  . Sore Throat   Subjective:     HPI Patient here today c/o sore throat since last night. Patient denies any fever, cough or runny nose. Patient reports he has not been around any one with strep. Has a speaking engagement tomorrow and his voice is hoarse.   No Known Allergies   Current Outpatient Medications:  .  aspirin EC 81 MG tablet, Take 81 mg by mouth daily., Disp: , Rfl:  .  atorvastatin (LIPITOR) 40 MG tablet, TAKE 1 TABLET BY MOUTH ONCE DAILY, Disp: 30 tablet, Rfl: 12 .  levothyroxine (SYNTHROID, LEVOTHROID) 75 MCG tablet, TAKE 1 TABLET BY MOUTH ONCE DAILY BEFORE BREAKFAST, Disp: 90 tablet, Rfl: 4 .  losartan (COZAAR) 50 MG tablet, TAKE 1 TABLET BY MOUTH ONCE DAILY, Disp: 90 tablet, Rfl: 4 .  metFORMIN (GLUCOPHAGE-XR) 500 MG 24 hr tablet, TAKE 1 TABLET BY MOUTH ONCE DAILY WITH BREAKFAST, Disp: 90 tablet, Rfl: 4 .  Multiple Vitamins-Minerals (MULTIVITAMIN ADULT PO), Take 1 tablet by mouth daily., Disp: , Rfl:  .  omeprazole (PRILOSEC) 20 MG capsule, Take 20 mg by mouth daily., Disp: , Rfl:  .  tamsulosin (FLOMAX) 0.4 MG CAPS capsule, TAKE 1 CAPSULE BY MOUTH ONCE DAILY AFTER  SUPPER, Disp: 90 capsule, Rfl: 4 .  triamterene-hydrochlorothiazide (MAXZIDE) 75-50 MG tablet, TAKE ONE TABLET BY MOUTH ONCE DAILY, Disp: 90 tablet, Rfl: 4 .  Cholecalciferol (VITAMIN D3) 20 MCG TABS, Take 1 tablet by mouth daily. , Disp: , Rfl:  .  Cyanocobalamin (VITAMIN B12) 1000 MCG TBCR, Take 1 tablet by mouth daily. , Disp: , Rfl:  .  predniSONE (DELTASONE) 10 MG tablet, Take 1 tablet (10 mg total) by mouth daily with breakfast for 3 days., Disp: 3 tablet, Rfl: 0  Review of Systems  HENT: Positive for sore throat.     Social History   Tobacco Use  . Smoking status: Never Smoker  . Smokeless  tobacco: Never Used  Substance Use Topics  . Alcohol use: No    Alcohol/week: 0.0 standard drinks      Objective:   BP 132/72 (BP Location: Left Arm, Patient Position: Sitting, Cuff Size: Normal)   Pulse 80   Temp 98.2 F (36.8 C) (Oral)   Resp 16   Wt 198 lb (89.8 kg)   SpO2 96%   BMI 28.82 kg/m  Vitals:   10/07/18 1233  BP: 132/72  Pulse: 80  Resp: 16  Temp: 98.2 F (36.8 C)  TempSrc: Oral  SpO2: 96%  Weight: 198 lb (89.8 kg)     Physical Exam Constitutional:      General: He is not in acute distress.    Appearance: He is well-developed. He is not diaphoretic.  HENT:     Right Ear: Tympanic membrane and external ear normal.     Left Ear: Tympanic membrane and external ear normal.     Nose: Rhinorrhea present.     Right Sinus: No maxillary sinus tenderness or frontal sinus tenderness.     Left Sinus: No maxillary sinus tenderness or frontal sinus tenderness.     Mouth/Throat:     Pharynx: Uvula midline. No oropharyngeal exudate.  Eyes:     General:        Right eye: Discharge  present.        Left eye: Discharge present.    Conjunctiva/sclera: Conjunctivae normal.     Comments: Watery Discharge   Neck:     Musculoskeletal: Normal range of motion and neck supple.  Cardiovascular:     Rate and Rhythm: Normal rate and regular rhythm.  Pulmonary:     Effort: Pulmonary effort is normal. No respiratory distress.     Breath sounds: Normal breath sounds. No wheezing or rales.  Lymphadenopathy:     Cervical: No cervical adenopathy.  Skin:    General: Skin is warm and dry.  Neurological:     Mental Status: He is alert and oriented to person, place, and time.  Psychiatric:        Behavior: Behavior normal.         Assessment & Plan    1. Sore throat  Rapid strep negative.   - POCT rapid strep A  2. Laryngitis  Prednisone as he has speaking engagement tomorrow.  - predniSONE (DELTASONE) 10 MG tablet; Take 1 tablet (10 mg total) by mouth daily with  breakfast for 3 days.  Dispense: 3 tablet; Refill: 0  The entirety of the information documented in the History of Present Illness, Review of Systems and Physical Exam were personally obtained by me. Portions of this information were initially documented by Lendon Ka, CMA and reviewed by me for thoroughness and accuracy.   No follow-ups on file.       Trinna Post, PA-C  [facility]

## 2018-10-24 DIAGNOSIS — D2371 Other benign neoplasm of skin of right lower limb, including hip: Secondary | ICD-10-CM | POA: Diagnosis not present

## 2018-10-24 DIAGNOSIS — M79671 Pain in right foot: Secondary | ICD-10-CM | POA: Diagnosis not present

## 2018-11-09 ENCOUNTER — Other Ambulatory Visit: Payer: Self-pay

## 2018-11-09 ENCOUNTER — Other Ambulatory Visit: Payer: Self-pay | Admitting: Family Medicine

## 2018-11-09 MED ORDER — TRIAMTERENE-HCTZ 75-50 MG PO TABS: 1.0000 | ORAL_TABLET | Freq: Every day | ORAL | 1 refills | Status: DC

## 2018-11-14 ENCOUNTER — Encounter: Payer: Self-pay | Admitting: Family Medicine

## 2018-11-14 ENCOUNTER — Other Ambulatory Visit: Payer: Self-pay

## 2018-11-14 ENCOUNTER — Ambulatory Visit (INDEPENDENT_AMBULATORY_CARE_PROVIDER_SITE_OTHER): Payer: Medicare Other | Admitting: Family Medicine

## 2018-11-14 VITALS — BP 130/68 | HR 67 | Temp 97.5°F | Ht 70.0 in | Wt 196.6 lb

## 2018-11-14 DIAGNOSIS — E119 Type 2 diabetes mellitus without complications: Secondary | ICD-10-CM

## 2018-11-14 LAB — POCT GLYCOSYLATED HEMOGLOBIN (HGB A1C): Hemoglobin A1C: 6.3 % — AB (ref 4.0–5.6)

## 2018-11-14 NOTE — Patient Instructions (Signed)
.   Please review the attached list of medications and notify my office if there are any errors.   . Please bring all of your medications to every appointment so we can make sure that our medication list is the same as yours.   

## 2018-11-14 NOTE — Progress Notes (Signed)
Patient: Ernest James. Male    DOB: 26-Dec-1935   83 y.o.   MRN: [id number] Visit Date: 11/14/2018  Today's Provider: Lelon Huh, MD   No chief complaint on file.  Subjective:     HPI   Diabetes Mellitus Type II, Follow-up:   Lab Results  Component Value Date   HGBA1C 7.1 (A) 03/31/2018   HGBA1C 6.8 09/30/2017   HGBA1C 6.7 (H) 03/30/2017    Last seen for diabetes 8 months ago.  Management since then includes started metformin ER 500 mg QD. He reports good compliance with treatment. He is not having side effects.  Current symptoms include none and have been stable. Home blood sugar records: pt hasn't been checking blood sugars at home.  he also wasn't able to attend the nutrition class and would like to be re-enrolled in it.  Episodes of hypoglycemia? no   Current Insulin Regimen: none Most Recent Eye Exam: none Weight trend: decreasing steadily Prior visit with dietician: Yes  Current exercise: not much Current diet habits: in general, a "healthy" diet     Pertinent Labs:    Component Value Date/Time   CHOL 107 03/31/2018 0945   TRIG 169 (H) 03/31/2018 0945   HDL 40 03/31/2018 0945   LDLCALC 33 03/31/2018 0945   CREATININE 1.24 03/31/2018 0945    Wt Readings from Last 3 Encounters:  10/07/18 198 lb (89.8 kg)  08/23/18 200 lb (90.7 kg)  07/05/18 196 lb 6.4 oz (89.1 kg)   BP Readings from Last 3 Encounters:  10/07/18 132/72  08/23/18 123/71  07/05/18 138/60    ------------------------------------------------------------------------   No Known Allergies   Current Outpatient Medications:  .  aspirin EC 81 MG tablet, Take 81 mg by mouth daily., Disp: , Rfl:  .  atorvastatin (LIPITOR) 40 MG tablet, TAKE 1 TABLET BY MOUTH ONCE DAILY, Disp: 30 tablet, Rfl: 12 .  Cholecalciferol (VITAMIN D3) 20 MCG TABS, Take 1 tablet by mouth daily. , Disp: , Rfl:  .  Cyanocobalamin (VITAMIN B12) 1000 MCG TBCR, Take 1 tablet by mouth daily. ,  Disp: , Rfl:  .  levothyroxine (SYNTHROID, LEVOTHROID) 75 MCG tablet, TAKE 1 TABLET BY MOUTH ONCE DAILY BEFORE BREAKFAST, Disp: 90 tablet, Rfl: 4 .  losartan (COZAAR) 50 MG tablet, TAKE 1 TABLET BY MOUTH ONCE DAILY, Disp: 90 tablet, Rfl: 4 .  metFORMIN (GLUCOPHAGE-XR) 500 MG 24 hr tablet, TAKE 1 TABLET BY MOUTH ONCE DAILY WITH BREAKFAST, Disp: 90 tablet, Rfl: 4 .  Multiple Vitamins-Minerals (MULTIVITAMIN ADULT PO), Take 1 tablet by mouth daily., Disp: , Rfl:  .  omeprazole (PRILOSEC) 20 MG capsule, Take 20 mg by mouth daily., Disp: , Rfl:  .  tamsulosin (FLOMAX) 0.4 MG CAPS capsule, TAKE 1 CAPSULE BY MOUTH ONCE DAILY AFTER  SUPPER, Disp: 90 capsule, Rfl: 4 .  triamterene-hydrochlorothiazide (MAXZIDE) 75-50 MG tablet, Take 1 tablet by mouth daily., Disp: 90 tablet, Rfl: 1  Review of Systems  Constitutional: Negative.   HENT: Negative.   Eyes: Negative.   Respiratory: Negative.   Cardiovascular: Negative.   Gastrointestinal: Negative.   Endocrine: Negative.   Genitourinary: Negative.   Musculoskeletal: Negative.   Skin: Negative.   Allergic/Immunologic: Negative.   Neurological: Negative.   Hematological: Negative.   Psychiatric/Behavioral: Negative.     Social History   Tobacco Use  . Smoking status: Never Smoker  . Smokeless tobacco: Never Used  Substance Use Topics  . Alcohol use: No  Alcohol/week: 0.0 standard drinks      Objective:   BP 130/68 (BP Location: Right Arm, Patient Position: Sitting, Cuff Size: Normal)   Pulse 67   Temp (!) 97.5 F (36.4 C) (Oral)   Ht 5\' 10"  (1.778 m)   Wt 196 lb 9.6 oz (89.2 kg)   SpO2 96%   BMI 28.21 kg/m    Physical Exam   General Appearance:    Alert, cooperative, no distress  Eyes:    PERRL, conjunctiva/corneas clear, EOM's intact       Lungs:     Clear to auscultation bilaterally, respirations unlabored  Heart:    Regular rate and rhythm  Neurologic:   Awake, alert, oriented x 3. No apparent focal neurological            defect.       Results for orders placed or performed in visit on 11/14/18  POCT glycosylated hemoglobin (Hb A1C)  Result Value Ref Range   Hemoglobin A1C 6.3 (A) 4.0 - 5.6 %   HbA1c POC (<> result, manual entry)     HbA1c, POC (prediabetic range)     HbA1c, POC (controlled diabetic range)         Assessment & Plan    1. Type 2 diabetes mellitus without complication, without long-term current use of insulin (HCC) Well controlled.  Continue current medications.  New referral entered to finish diabetic education classes.  - POCT glycosylated hemoglobin (Hb A1C) - Amb ref to Medical Nutrition Therapy-MNT  Future Appointments  Date Time Provider [place]  04/07/2019 10:00 AM , Kirstie Peri, MD [facility] None      Lelon Huh, MD  [facility]

## 2018-11-21 ENCOUNTER — Other Ambulatory Visit: Payer: Self-pay | Admitting: Family Medicine

## 2018-12-28 ENCOUNTER — Encounter (INDEPENDENT_AMBULATORY_CARE_PROVIDER_SITE_OTHER): Payer: Medicare Other

## 2018-12-28 ENCOUNTER — Ambulatory Visit (INDEPENDENT_AMBULATORY_CARE_PROVIDER_SITE_OTHER): Payer: Medicare Other | Admitting: Nurse Practitioner

## 2019-01-11 ENCOUNTER — Ambulatory Visit (INDEPENDENT_AMBULATORY_CARE_PROVIDER_SITE_OTHER): Payer: Medicare Other

## 2019-01-11 ENCOUNTER — Ambulatory Visit (INDEPENDENT_AMBULATORY_CARE_PROVIDER_SITE_OTHER): Payer: Medicare Other | Admitting: Nurse Practitioner

## 2019-01-11 ENCOUNTER — Encounter (INDEPENDENT_AMBULATORY_CARE_PROVIDER_SITE_OTHER): Payer: Self-pay | Admitting: Nurse Practitioner

## 2019-01-11 ENCOUNTER — Other Ambulatory Visit: Payer: Self-pay

## 2019-01-11 VITALS — BP 152/73 | HR 69 | Resp 12 | Ht 70.0 in | Wt 197.0 lb

## 2019-01-11 DIAGNOSIS — Z7984 Long term (current) use of oral hypoglycemic drugs: Secondary | ICD-10-CM | POA: Diagnosis not present

## 2019-01-11 DIAGNOSIS — Z7982 Long term (current) use of aspirin: Secondary | ICD-10-CM | POA: Diagnosis not present

## 2019-01-11 DIAGNOSIS — I6523 Occlusion and stenosis of bilateral carotid arteries: Secondary | ICD-10-CM

## 2019-01-11 DIAGNOSIS — Z79899 Other long term (current) drug therapy: Secondary | ICD-10-CM

## 2019-01-11 DIAGNOSIS — E119 Type 2 diabetes mellitus without complications: Secondary | ICD-10-CM

## 2019-01-11 DIAGNOSIS — K2100 Gastro-esophageal reflux disease with esophagitis, without bleeding: Secondary | ICD-10-CM

## 2019-01-11 NOTE — Progress Notes (Signed)
SUBJECTIVE:  Patient ID: Ernest Reef [name]., male    DOB: Aug 21, 1936, 83 y.o.   MRN: [id number] Chief Complaint  Patient presents with  . Follow-up    HPI  [facility] Martie Fulgham. is a 83 y.o. male The patient is seen for follow up evaluation of carotid stenosis. The carotid stenosis followed by ultrasound.   The patient denies amaurosis fugax. There is no recent history of TIA symptoms or focal motor deficits. The patient has not had any episodes of "haziness" that he described at last visit.  There is no prior documented CVA.  The patient is taking enteric-coated aspirin 81 mg daily.  There is no history of migraine headaches. There is no history of seizures.  The patient has a history of coronary artery disease, no recent episodes of angina or shortness of breath. The patient denies PAD or claudication symptoms. There is a history of hyperlipidemia which is being treated with a statin.    Carotid Duplex done today shows 1-39% of the right ICA and 40-59% of the left ICA.  No change compared to last study in 06/27/2018.  Past Medical History:  Diagnosis Date  . Diabetes mellitus without complication ([place])   . GERD (gastroesophageal reflux disease)   . Hypertension   . Phimosis   . Thyroid disease     Past Surgical History:  Procedure Laterality Date  . CIRCUMCISION    . COLONOSCOPY WITH PROPOFOL N/A 03/31/2016   Procedure: COLONOSCOPY WITH PROPOFOL;  Surgeon: Lollie Sails, MD;  Location: [facility] ENDOSCOPY;  Service: Endoscopy;  Laterality: N/A;  . SINUS SURGERY  1994   on left side  . TONSILLECTOMY      Social History   Socioeconomic History  . Marital status: Married    Spouse name: Not on file  . Number of children: 3  . Years of education: Not on file  . Highest education level: Bachelor's degree (e.g., BA, AB, BS)  Occupational History  . 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 Financial resource strain: Not hard at all   . Food insecurity:    Worry: Never true    Inability: Never true  . Transportation needs:    Medical: No    Non-medical: No  Tobacco Use  . Smoking status: Never Smoker  . Smokeless tobacco: Never Used  Substance and Sexual Activity  . Alcohol use: No    Alcohol/week: 0.0 standard drinks  . Drug use: No  . Sexual activity: Not on file  Lifestyle  . Physical activity:    Days per week: Not on file    Minutes per session: Not on file  . Stress: Not at all  Relationships  . Social connections:    Talks on phone: Not on file    Gets together: Not on file    Attends religious service: Not on file    Active member of club or organization: Not on file    Attends meetings of clubs or organizations: Not on file    Relationship status: Not on file  . Intimate partner violence:    Fear of current or ex partner: Not on file    Emotionally abused: Not on file    Physically abused: Not on file    Forced sexual activity: Not on file  Other Topics Concern  . Not on file  Social History Narrative  . Not on file    Family History  Problem Relation Age of Onset  .  Alzheimer's disease Mother   . CAD Father   . Diabetes Sister        type 2    No Known Allergies   Review of Systems   Review of Systems: Negative Unless Checked Constitutional: [] Weight loss  [] Fever  [] Chills Cardiac: [] Chest pain   []  Atrial Fibrillation  [] Palpitations   [] Shortness of breath when laying flat   [] Shortness of breath with exertion. [] Shortness of breath at rest Vascular:  [] Pain in legs with walking   [] Pain in legs with standing [] Pain in legs when laying flat   [] Claudication    [] Pain in feet when laying flat    [] History of DVT   [] Phlebitis   [] Swelling in legs   [] Varicose veins   [] Non-healing ulcers Pulmonary:   [] Uses home oxygen   [] Productive cough   [] Hemoptysis   [] Wheeze  [] COPD   [] Asthma Neurologic:  [] Dizziness   [] Seizures  [] Blackouts [] History of stroke   [x] History of TIA   [] Aphasia   [] Temporary Blindness   [] Weakness or numbness in arm   [] Weakness or numbness in leg Musculoskeletal:   [] Joint swelling   [] Joint pain   [] Low back pain  []  History of Knee Replacement [] Arthritis [] back Surgeries  []  Spinal Stenosis    Hematologic:  [] Easy bruising  [] Easy bleeding   [] Hypercoagulable state   [] Anemic Gastrointestinal:  [] Diarrhea   [] Vomiting  [x] Gastroesophageal reflux/heartburn   [] Difficulty swallowing. [] Abdominal pain Genitourinary:  [] Chronic kidney disease   [] Difficult urination  [] Anuric   [] Blood in urine [] Frequent urination  [] Burning with urination   [] Hematuria Skin:  [] Rashes   [] Ulcers [] Wounds Psychological:  [] History of anxiety   []  History of major depression  []  Memory Difficulties      OBJECTIVE:   Physical Exam  BP (!) 152/73 (BP Location: Left Arm, Patient Position: Sitting, Cuff Size: Small)   Pulse 69   Resp 12   Ht 5\' 10"  (1.778 m)   Wt 197 lb (89.4 kg)   BMI 28.27 kg/m   Gen: WD/WN, NAD Head: [place]/AT, No temporalis wasting.  Ear/Nose/Throat: Hearing grossly intact, nares w/o erythema or drainage Eyes: PER, EOMI, sclera nonicteric.  Neck: Supple, no masses.  No JVD.  Pulmonary:  Good air movement, no use of accessory muscles.  Cardiac: RRR Vascular: No carotid bruit noted Vessel Right Left  Radial Palpable Palpable  Dorsalis Pedis Palpable Palpable  Posterior Tibial Palpable Palpable   Gastrointestinal: soft, non-distended. No guarding/no peritoneal signs.  Musculoskeletal: M/S 5/5 throughout.  No deformity or atrophy.  Neurologic: Pain and light touch intact in extremities.  Symmetrical.  Speech is fluent. Motor exam as listed above. Psychiatric: Judgment intact, Mood & affect appropriate for pt's clinical situation. Dermatologic: No Venous rashes. No Ulcers Noted.  No changes consistent with cellulitis. Lymph : No Cervical lymphadenopathy, no lichenification or skin changes of chronic lymphedema.       ASSESSMENT  AND PLAN:  1. Bilateral carotid artery stenosis Carotid Duplex done today shows 1-39% of the right ICA and 40-59% of the left ICA.  No change compared to last study in 06/27/2018.  Recommend:  Given the patient's asymptomatic subcritical stenosis no further invasive testing or surgery at this time.   Continue antiplatelet therapy as prescribed Continue management of CAD, HTN and Hyperlipidemia Healthy heart diet,  encouraged exercise at least 4 times per week Follow up in 12 months with duplex ultrasound and physical exam  - VAS US CAROTID; Future  2. Esophagitis, reflux Continue  PPI as already ordered, this medication has been reviewed and there are no changes at this time.  Avoidence of caffeine and alcohol  Moderate elevation of the head of the bed   3. Diabetes mellitus without complication ([place]) Continue hypoglycemic medications as already ordered, these medications have been reviewed and there are no changes at this time.  Hgb A1C to be monitored as already arranged by primary service    Current Outpatient Medications on File Prior to Visit  Medication Sig Dispense Refill  . aspirin EC 81 MG tablet Take 81 mg by mouth daily.    Marland Kitchen atorvastatin (LIPITOR) 40 MG tablet TAKE 1 TABLET BY MOUTH ONCE DAILY 30 tablet 12  . Cholecalciferol (VITAMIN D3) 20 MCG TABS Take 1 tablet by mouth daily.     Marland Kitchen levothyroxine (SYNTHROID, LEVOTHROID) 75 MCG tablet Take 1 tablet (75 mcg total) by mouth daily before breakfast. 90 tablet 4  . losartan (COZAAR) 50 MG tablet TAKE 1 TABLET BY MOUTH ONCE DAILY 90 tablet 4  . metFORMIN (GLUCOPHAGE-XR) 500 MG 24 hr tablet TAKE 1 TABLET BY MOUTH ONCE DAILY WITH BREAKFAST 90 tablet 4  . omeprazole (PRILOSEC) 20 MG capsule Take 20 mg by mouth daily. PRN    . tamsulosin (FLOMAX) 0.4 MG CAPS capsule TAKE 1 CAPSULE BY MOUTH ONCE DAILY AFTER  SUPPER 90 capsule 4  . triamterene-hydrochlorothiazide (MAXZIDE) 75-50 MG tablet Take 1 tablet by mouth daily. 90 tablet  1  . Cyanocobalamin (VITAMIN B12) 1000 MCG TBCR Take 1 tablet by mouth daily.     . Multiple Vitamins-Minerals (MULTIVITAMIN ADULT PO) Take 1 tablet by mouth daily.     No current facility-administered medications on file prior to visit.     There are no Patient Instructions on file for this visit. No follow-ups on file.   Kris Hartmann, NP  This note was completed with Sales executive.  Any errors are purely unintentional.

## 2019-01-12 ENCOUNTER — Encounter: Payer: Self-pay | Admitting: *Deleted

## 2019-01-12 ENCOUNTER — Encounter: Payer: Medicare Other | Attending: Family Medicine | Admitting: *Deleted

## 2019-01-12 VITALS — Wt 199.3 lb

## 2019-01-12 DIAGNOSIS — E119 Type 2 diabetes mellitus without complications: Secondary | ICD-10-CM | POA: Insufficient documentation

## 2019-01-12 NOTE — Progress Notes (Signed)

## 2019-02-16 ENCOUNTER — Encounter: Payer: Medicare Other | Attending: Family Medicine | Admitting: Dietician

## 2019-02-16 ENCOUNTER — Other Ambulatory Visit: Payer: Self-pay

## 2019-02-16 ENCOUNTER — Encounter: Payer: Self-pay | Admitting: Dietician

## 2019-02-16 DIAGNOSIS — E119 Type 2 diabetes mellitus without complications: Secondary | ICD-10-CM | POA: Diagnosis not present

## 2019-02-16 NOTE — Progress Notes (Signed)

## 2019-02-23 ENCOUNTER — Encounter: Payer: Self-pay | Admitting: *Deleted

## 2019-03-22 ENCOUNTER — Ambulatory Visit: Payer: Self-pay | Admitting: Family Medicine

## 2019-04-04 ENCOUNTER — Ambulatory Visit (INDEPENDENT_AMBULATORY_CARE_PROVIDER_SITE_OTHER): Payer: Medicare Other

## 2019-04-04 ENCOUNTER — Other Ambulatory Visit: Payer: Self-pay

## 2019-04-04 DIAGNOSIS — Z0000 Encounter for general adult medical examination without abnormal findings: Secondary | ICD-10-CM

## 2019-04-04 NOTE — Patient Instructions (Signed)
Ernest James , Thank you for taking time to come for your Medicare Wellness Visit. I appreciate your ongoing commitment to your health goals. Please review the following plan we discussed and let me know if I can assist you in the future.   Screening recommendations/referrals: Colonoscopy: Up to date, due 03/2021 Recommended yearly ophthalmology/optometry visit for glaucoma screening and checkup Recommended yearly dental visit for hygiene and checkup  Vaccinations: Influenza vaccine: Up to date Pneumococcal vaccine: Completed series Tdap vaccine: Up to date, due 07/2024 Shingles vaccine: Pt declines today.     Advanced directives: Please bring a copy of your POA (Power of Attorney) and/or Living Will to your next appointment.   Conditions/risks identified: Continue current diet plan of increasing fruits and vegetables in daily diet.   Next appointment: 04/07/19 @ 10:00 AM with Dr Caryn Section.   Preventive Care 81 Years and Older, Male Preventive care refers to lifestyle choices and visits with your health care provider that can promote health and wellness. What does preventive care include?  A yearly physical exam. This is also called an annual well check.  Dental exams once or twice a year.  Routine eye exams. Ask your health care provider how often you should have your eyes checked.  Personal lifestyle choices, including:  Daily care of your teeth and gums.  Regular physical activity.  Eating a healthy diet.  Avoiding tobacco and drug use.  Limiting alcohol use.  Practicing safe sex.  Taking low doses of aspirin every day.  Taking vitamin and mineral supplements as recommended by your health care provider. What happens during an annual well check? The services and screenings done by your health care provider during your annual well check will depend on your age, overall health, lifestyle risk factors, and family history of disease. Counseling  Your health care provider  may ask you questions about your:  Alcohol use.  Tobacco use.  Drug use.  Emotional well-being.  Home and relationship well-being.  Sexual activity.  Eating habits.  History of falls.  Memory and ability to understand (cognition).  Work and work Statistician. Screening  You may have the following tests or measurements:  Height, weight, and BMI.  Blood pressure.  Lipid and cholesterol levels. These may be checked every 5 years, or more frequently if you are over 81 years old.  Skin check.  Lung cancer screening. You may have this screening every year starting at age 81 if you have a 30-pack-year history of smoking and currently smoke or have quit within the past 15 years.  Fecal occult blood test (FOBT) of the stool. You may have this test every year starting at age 81.  Flexible sigmoidoscopy or colonoscopy. You may have a sigmoidoscopy every 5 years or a colonoscopy every 10 years starting at age 81.  Prostate cancer screening. Recommendations will vary depending on your family history and other risks.  Hepatitis C blood test.  Hepatitis B blood test.  Sexually transmitted disease (STD) testing.  Diabetes screening. This is done by checking your blood sugar (glucose) after you have not eaten for a while (fasting). You may have this done every 1-3 years.  Abdominal aortic aneurysm (AAA) screening. You may need this if you are a current or former smoker.  Osteoporosis. You may be screened starting at age 81 if you are at high risk. Talk with your health care provider about your test results, treatment options, and if necessary, the need for more tests. Vaccines  Your health care provider  may recommend certain vaccines, such as:  Influenza vaccine. This is recommended every year.  Tetanus, diphtheria, and acellular pertussis (Tdap, Td) vaccine. You may need a Td booster every 10 years.  Zoster vaccine. You may need this after age 81.  Pneumococcal 13-valent  conjugate (PCV13) vaccine. One dose is recommended after age 81.  Pneumococcal polysaccharide (PPSV23) vaccine. One dose is recommended after age 81. Talk to your health care provider about which screenings and vaccines you need and how often you need them. This information is not intended to replace advice given to you by your health care provider. Make sure you discuss any questions you have with your health care provider. Document Released: 09/20/2015 Document Revised: 05/13/2016 Document Reviewed: 06/25/2015 Elsevier Interactive Patient Education  2017 [place] Prevention in the Home Falls can cause injuries. They can happen to people of all ages. There are many things you can do to make your home safe and to help prevent falls. What can I do on the outside of my home?  Regularly fix the edges of walkways and driveways and fix any cracks.  Remove anything that might make you trip as you walk through a door, such as a raised step or threshold.  Trim any bushes or trees on the path to your home.  Use bright outdoor lighting.  Clear any walking paths of anything that might make someone trip, such as rocks or tools.  Regularly check to see if handrails are loose or broken. Make sure that both sides of any steps have handrails.  Any raised decks and porches should have guardrails on the edges.  Have any leaves, snow, or ice cleared regularly.  Use sand or salt on walking paths during winter.  Clean up any spills in your garage right away. This includes oil or grease spills. What can I do in the bathroom?  Use night lights.  Install grab bars by the toilet and in the tub and shower. Do not use towel bars as grab bars.  Use non-skid mats or decals in the tub or shower.  If you need to sit down in the shower, use a plastic, non-slip stool.  Keep the floor dry. Clean up any water that spills on the floor as soon as it happens.  Remove soap buildup in the tub or  shower regularly.  Attach bath mats securely with double-sided non-slip rug tape.  Do not have throw rugs and other things on the floor that can make you trip. What can I do in the bedroom?  Use night lights.  Make sure that you have a light by your bed that is easy to reach.  Do not use any sheets or blankets that are too big for your bed. They should not hang down onto the floor.  Have a firm chair that has side arms. You can use this for support while you get dressed.  Do not have throw rugs and other things on the floor that can make you trip. What can I do in the kitchen?  Clean up any spills right away.  Avoid walking on wet floors.  Keep items that you use a lot in easy-to-reach places.  If you need to reach something above you, use a strong step stool that has a grab bar.  Keep electrical cords out of the way.  Do not use floor polish or wax that makes floors slippery. If you must use wax, use non-skid floor wax.  Do not have throw rugs  and other things on the floor that can make you trip. What can I do with my stairs?  Do not leave any items on the stairs.  Make sure that there are handrails on both sides of the stairs and use them. Fix handrails that are broken or loose. Make sure that handrails are as long as the stairways.  Check any carpeting to make sure that it is firmly attached to the stairs. Fix any carpet that is loose or worn.  Avoid having throw rugs at the top or bottom of the stairs. If you do have throw rugs, attach them to the floor with carpet tape.  Make sure that you have a light switch at the top of the stairs and the bottom of the stairs. If you do not have them, ask someone to add them for you. What else can I do to help prevent falls?  Wear shoes that:  Do not have high heels.  Have rubber bottoms.  Are comfortable and fit you well.  Are closed at the toe. Do not wear sandals.  If you use a stepladder:  Make sure that it is fully  opened. Do not climb a closed stepladder.  Make sure that both sides of the stepladder are locked into place.  Ask someone to hold it for you, if possible.  Clearly mark and make sure that you can see:  Any grab bars or handrails.  First and last steps.  Where the edge of each step is.  Use tools that help you move around (mobility aids) if they are needed. These include:  Canes.  Walkers.  Scooters.  Crutches.  Turn on the lights when you go into a dark area. Replace any light bulbs as soon as they burn out.  Set up your furniture so you have a clear path. Avoid moving your furniture around.  If any of your floors are uneven, fix them.  If there are any pets around you, be aware of where they are.  Review your medicines with your doctor. Some medicines can make you feel dizzy. This can increase your chance of falling. Ask your doctor what other things that you can do to help prevent falls. This information is not intended to replace advice given to you by your health care provider. Make sure you discuss any questions you have with your health care provider. Document Released: 06/20/2009 Document Revised: 01/30/2016 Document Reviewed: 09/28/2014 Elsevier Interactive Patient Education  2017 [employer].

## 2019-04-04 NOTE — Progress Notes (Signed)
Subjective:   Roselyn Reef Semir Brill. is a 83 y.o. male who presents for Medicare Annual/Subsequent preventive examination.    This visit is being conducted through telemedicine due to the COVID-19 pandemic. This patient has given me verbal consent via doximity to conduct this visit, patient states they are participating from their home address. Some vital signs may be absent or patient reported.    Patient identification: identified by name, DOB, and current address  Review of Systems:  N/A  Cardiac Risk Factors include: advanced age (>65men, >66 women);dyslipidemia;hypertension;male gender     Objective:    Vitals: There were no vitals taken for this visit.  There is no height or weight on file to calculate BMI. Unable to obtain vitals due to visit being conducted via telephonically.   Advanced Directives 04/04/2019 04/12/2018 03/31/2018 06/16/2017 03/31/2016  Does Patient Have a Medical Advance Directive? Yes Yes Yes Yes No  Type of Paramedic of [place];Living will [place];Living will [place];Living will [place];Living will -  Does patient want to make changes to medical advance directive? - No - Patient declined - - -  Copy of [place] in Chart? No - copy requested - No - copy requested - -  Would patient like information on creating a medical advance directive? - - - - No - patient declined information    Tobacco Social History   Tobacco Use  Smoking Status Never Smoker  Smokeless Tobacco Never Used     Counseling given: Not Answered   Clinical Intake:  Pre-visit preparation completed: Yes  Pain : No/denies pain Pain Score: 0-No pain     Nutritional Risks: None Diabetes: Yes  How often do you need to have someone help you when you read instructions, pamphlets, or other written materials from your doctor or pharmacy?: 1 - Never   Diabetes:  Is the  patient diabetic?  Yes type 2 If diabetic, was a CBG obtained today?  No  Did the patient bring in their glucometer from home?  No  How often do you monitor your CBG's? Has not been checking BS.   Financial Strains and Diabetes Management:  Are you having any financial strains with the device, your supplies or your medication? No .  Does the patient want to be seen by Chronic Care Management for management of their diabetes?  No  Would the patient like to be referred to a Nutritionist or for Diabetic Management?  No   Diabetic Exams:  Diabetic Eye Exam: Completed 04/11/18. Repeat yearly.   Diabetic Foot Exam: Pt has been advised about the importance in completing this exam. Note made to follow up on this at next in office visit.    Interpreter Needed?: No  Information entered by :: [facility], LPN  Past Medical History:  Diagnosis Date  . Diabetes mellitus without complication ([place])   . GERD (gastroesophageal reflux disease)   . Hypertension   . Phimosis   . Thyroid disease    Past Surgical History:  Procedure Laterality Date  . CIRCUMCISION    . COLONOSCOPY WITH PROPOFOL N/A 03/31/2016   Procedure: COLONOSCOPY WITH PROPOFOL;  Surgeon: Lollie Sails, MD;  Location: [facility] ENDOSCOPY;  Service: Endoscopy;  Laterality: N/A;  . SINUS SURGERY  1994   on left side  . TONSILLECTOMY     Family History  Problem Relation Age of Onset  . Alzheimer's disease Mother   . CAD Father   .  Diabetes Sister        type 2   Social History   Socioeconomic History  . Marital status: Married    Spouse name: Not on file  . Number of children: 3  . Years of education: Not on file  . Highest education level: Bachelor's degree (e.g., BA, AB, BS)  Occupational History  . 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 Financial resource strain: Not hard at all  . Food insecurity    Worry: Never true    Inability: Never true  . Transportation needs    Medical: No     Non-medical: No  Tobacco Use  . Smoking status: Never Smoker  . Smokeless tobacco: Never Used  Substance and Sexual Activity  . Alcohol use: No    Alcohol/week: 0.0 standard drinks  . Drug use: No  . Sexual activity: Not on file  Lifestyle  . Physical activity    Days per week: 0 days    Minutes per session: 0 min  . Stress: Not at all  Relationships  . Social Herbalist on phone: Patient refused    Gets together: Patient refused    Attends religious service: Patient refused    Active member of club or organization: Patient refused    Attends meetings of clubs or organizations: Patient refused    Relationship status: Patient refused  Other Topics Concern  . Not on file  Social History Narrative  . Not on file    Outpatient Encounter Medications as of 04/04/2019  Medication Sig  . aspirin EC 81 MG tablet Take 81 mg by mouth daily.  Marland Kitchen atorvastatin (LIPITOR) 40 MG tablet TAKE 1 TABLET BY MOUTH ONCE DAILY  . Cholecalciferol (VITAMIN D3) 20 MCG TABS Take 1 tablet by mouth daily.   . Cyanocobalamin (VITAMIN B12) 1000 MCG TBCR Take 1 tablet by mouth daily.   Marland Kitchen levothyroxine (SYNTHROID, LEVOTHROID) 75 MCG tablet Take 1 tablet (75 mcg total) by mouth daily before breakfast.  . losartan (COZAAR) 50 MG tablet TAKE 1 TABLET BY MOUTH ONCE DAILY  . metFORMIN (GLUCOPHAGE-XR) 500 MG 24 hr tablet TAKE 1 TABLET BY MOUTH ONCE DAILY WITH BREAKFAST  . Multiple Vitamins-Minerals (MULTIVITAMIN ADULT PO) Take 1 tablet by mouth daily.  Marland Kitchen omeprazole (PRILOSEC) 20 MG capsule Take 20 mg by mouth daily. PRN  . tamsulosin (FLOMAX) 0.4 MG CAPS capsule TAKE 1 CAPSULE BY MOUTH ONCE DAILY AFTER  SUPPER  . triamterene-hydrochlorothiazide (MAXZIDE) 75-50 MG tablet Take 1 tablet by mouth daily.   No facility-administered encounter medications on file as of 04/04/2019.     Activities of Daily Living In your present state of health, do you have any difficulty performing the following activities:  04/04/2019  Hearing? N  Vision? N  Comment Wears eye glasses daily.  Difficulty concentrating or making decisions? N  Walking or climbing stairs? N  Dressing or bathing? N  Doing errands, shopping? N  Preparing Food and eating ? N  Using the Toilet? N  In the past six months, have you accidently leaked urine? N  Do you have problems with loss of bowel control? N  Managing your Medications? N  Managing your Finances? N  Housekeeping or managing your Housekeeping? N  Some recent data might be hidden    Patient Care Team: Birdie Sons, MD as PCP - General (Family Medicine) Samara Deist, DPM as Referring Physician (Podiatry) Schnier, Dolores Lory, MD (Vascular Surgery)   Assessment:  This is a routine wellness examination for [place].  Exercise Activities and Dietary recommendations Current Exercise Habits: Home exercise routine, Type of exercise: walking, Time (Minutes): 20, Frequency (Times/Week): 3, Weekly Exercise (Minutes/Week): 60, Intensity: Mild, Exercise limited by: None identified  Goals    . DIET - EAT MORE FRUITS AND VEGETABLES     Continue current diet plan of increasing fruits and vegetables in daily diet.     Marland Kitchen DIET - INCREASE WATER INTAKE     Recommend increasing water intake to 4-6 glasses a day.        Fall Risk Fall Risk  04/04/2019 02/16/2019 01/12/2019 11/14/2018 06/02/2018  Falls in the past year? 0 1 1 1  (No Data)  Comment - no falls since 09/2018 - - no falls since previous visit  Number falls in past yr: - 0 0 0 -  Injury with Fall? - - 0 0 -   FALL RISK PREVENTION PERTAINING TO THE HOME:  Any stairs in or around the home? Yes  If so, are there any without handrails? No   Home free of loose throw rugs in walkways, pet beds, electrical cords, etc? Yes Adequate lighting in your home to reduce risk of falls? No   ASSISTIVE DEVICES UTILIZED TO PREVENT FALLS:  Life alert? No  Use of a cane, walker or w/c? No  Grab bars in the bathroom? Yes  Shower  chair or bench in shower? Yes  Elevated toilet seat or a handicapped toilet? No    TIMED UP AND GO:  Was the test performed? No .    Depression Screen PHQ 2/9 Scores 04/04/2019 11/14/2018 04/12/2018 03/31/2018  PHQ - 2 Score 0 0 0 0  PHQ- 9 Score - - - -    Cognitive Function     6CIT Screen 04/04/2019 03/31/2018  What Year? 0 points 0 points  What month? 0 points 0 points  What time? 0 points 0 points  Count back from 20 0 points 0 points  Months in reverse 0 points 0 points  Repeat phrase 0 points 2 points  Total Score 0 2    Immunization History  Administered Date(s) Administered  . Influenza Split 07/24/2011  . Influenza, High Dose Seasonal PF 06/27/2015, 05/27/2016, 06/19/2017, 07/05/2018  . Influenza,inj,Quad PF,6+ Mos 07/08/2014  . Pneumococcal Conjugate-13 06/27/2015  . Pneumococcal Polysaccharide-23 06/30/2007  . Td 06/07/2001  . Tdap 07/08/2014  . Zoster 02/16/2012    Qualifies for Shingles Vaccine? Yes  Zostavax completed 02/16/12. Due for Shingrix. Education has been provided regarding the importance of this vaccine. Pt has been advised to call insurance company to determine out of pocket expense. Advised may also receive vaccine at local pharmacy or Health Dept. Verbalized acceptance and understanding.  Tdap: Up to date  Flu Vaccine: Up to date  Pneumococcal Vaccine: Completed series  Screening Tests Health Maintenance  Topic Date Due  . FOOT EXAM  02/23/1946  . INFLUENZA VACCINE  04/08/2019  . OPHTHALMOLOGY EXAM  04/12/2019  . HEMOGLOBIN A1C  05/17/2019  . COLONOSCOPY  03/31/2021  . TETANUS/TDAP  07/08/2024  . PNA vac Low Risk Adult  Completed   Cancer Screenings:  Colorectal Screening: Completed 03/31/16. Repeat every 5 years.  Lung Cancer Screening: (Low Dose CT Chest recommended if Age 45-80 years, 30 pack-year currently smoking OR have quit w/in 15years.) does not qualify.   Additional Screening:  Dental Screening: Recommended annual dental  exams for proper oral hygiene  Community Resource Referral:  CRR required this visit?  No        Plan:  I have personally reviewed and addressed the Medicare Annual Wellness questionnaire and have noted the following in the patient's chart:  A. Medical and social history B. Use of alcohol, tobacco or illicit drugs  C. Current medications and supplements D. Functional ability and status E.  Nutritional status F.  Physical activity G. Advance directives H. List of other physicians I.  Hospitalizations, surgeries, and ER visits in previous 12 months J.  [place] such as hearing and vision if needed, cognitive and depression L. Referrals and appointments   In addition, I have reviewed and discussed with patient certain preventive protocols, quality metrics, and best practice recommendations. A written personalized care plan for preventive services as well as general preventive health recommendations were provided to patient.   Glendora Score, LPN  [id number] Nurse Health Advisor  Nurse Notes: Pt needs diabetic foot exam at next in office visit on 04/07/19.

## 2019-04-07 ENCOUNTER — Encounter: Payer: Medicare Other | Admitting: Family Medicine

## 2019-04-07 ENCOUNTER — Ambulatory Visit: Payer: Medicare Other

## 2019-04-11 ENCOUNTER — Other Ambulatory Visit: Payer: Self-pay | Admitting: Family Medicine

## 2019-04-17 DIAGNOSIS — M2042 Other hammer toe(s) (acquired), left foot: Secondary | ICD-10-CM | POA: Diagnosis not present

## 2019-04-17 DIAGNOSIS — M79671 Pain in right foot: Secondary | ICD-10-CM | POA: Diagnosis not present

## 2019-04-17 DIAGNOSIS — E1142 Type 2 diabetes mellitus with diabetic polyneuropathy: Secondary | ICD-10-CM | POA: Diagnosis not present

## 2019-04-17 DIAGNOSIS — D2371 Other benign neoplasm of skin of right lower limb, including hip: Secondary | ICD-10-CM | POA: Diagnosis not present

## 2019-04-17 DIAGNOSIS — M2041 Other hammer toe(s) (acquired), right foot: Secondary | ICD-10-CM | POA: Diagnosis not present

## 2019-05-01 DIAGNOSIS — M79674 Pain in right toe(s): Secondary | ICD-10-CM | POA: Diagnosis not present

## 2019-05-01 DIAGNOSIS — D2371 Other benign neoplasm of skin of right lower limb, including hip: Secondary | ICD-10-CM | POA: Diagnosis not present

## 2019-05-01 DIAGNOSIS — E1142 Type 2 diabetes mellitus with diabetic polyneuropathy: Secondary | ICD-10-CM | POA: Diagnosis not present

## 2019-05-01 DIAGNOSIS — M79675 Pain in left toe(s): Secondary | ICD-10-CM | POA: Diagnosis not present

## 2019-05-01 DIAGNOSIS — B351 Tinea unguium: Secondary | ICD-10-CM | POA: Diagnosis not present

## 2019-05-23 DIAGNOSIS — H2513 Age-related nuclear cataract, bilateral: Secondary | ICD-10-CM | POA: Diagnosis not present

## 2019-05-23 LAB — HM DIABETES EYE EXAM

## 2019-05-30 ENCOUNTER — Ambulatory Visit (INDEPENDENT_AMBULATORY_CARE_PROVIDER_SITE_OTHER): Payer: Medicare Other | Admitting: Family Medicine

## 2019-05-30 ENCOUNTER — Encounter: Payer: Self-pay | Admitting: Family Medicine

## 2019-05-30 ENCOUNTER — Other Ambulatory Visit: Payer: Self-pay

## 2019-05-30 VITALS — BP 130/62 | HR 66 | Temp 97.3°F | Resp 16 | Ht 70.0 in | Wt 197.0 lb

## 2019-05-30 DIAGNOSIS — G4700 Insomnia, unspecified: Secondary | ICD-10-CM

## 2019-05-30 DIAGNOSIS — E039 Hypothyroidism, unspecified: Secondary | ICD-10-CM

## 2019-05-30 DIAGNOSIS — I6523 Occlusion and stenosis of bilateral carotid arteries: Secondary | ICD-10-CM

## 2019-05-30 DIAGNOSIS — I440 Atrioventricular block, first degree: Secondary | ICD-10-CM

## 2019-05-30 DIAGNOSIS — E119 Type 2 diabetes mellitus without complications: Secondary | ICD-10-CM

## 2019-05-30 DIAGNOSIS — N401 Enlarged prostate with lower urinary tract symptoms: Secondary | ICD-10-CM | POA: Diagnosis not present

## 2019-05-30 DIAGNOSIS — R3911 Hesitancy of micturition: Secondary | ICD-10-CM | POA: Diagnosis not present

## 2019-05-30 DIAGNOSIS — L308 Other specified dermatitis: Secondary | ICD-10-CM | POA: Diagnosis not present

## 2019-05-30 DIAGNOSIS — E782 Mixed hyperlipidemia: Secondary | ICD-10-CM

## 2019-05-30 DIAGNOSIS — I10 Essential (primary) hypertension: Secondary | ICD-10-CM | POA: Diagnosis not present

## 2019-05-30 DIAGNOSIS — E781 Pure hyperglyceridemia: Secondary | ICD-10-CM

## 2019-05-30 DIAGNOSIS — Z23 Encounter for immunization: Secondary | ICD-10-CM | POA: Diagnosis not present

## 2019-05-30 MED ORDER — ONETOUCH VERIO VI STRP: ORAL_STRIP | 12 refills | Status: DC

## 2019-05-30 MED ORDER — TRIAMCINOLONE ACETONIDE 0.1 % EX CREA: 1.0000 "application " | TOPICAL_CREAM | Freq: Two times a day (BID) | CUTANEOUS | 0 refills | Status: DC

## 2019-05-30 MED ORDER — TAMSULOSIN HCL 0.4 MG PO CAPS: 0.4000 mg | ORAL_CAPSULE | Freq: Two times a day (BID) | ORAL | 0 refills | Status: DC

## 2019-05-30 NOTE — Patient Instructions (Signed)
. Please review the attached list of medications and notify my office if there are any errors.   . Please bring all of your medications to every appointment so we can make sure that our medication list is the same as yours.    Insomnia Insomnia is a sleep disorder that makes it difficult to fall asleep or stay asleep. Insomnia can cause fatigue, low energy, difficulty concentrating, mood swings, and poor performance at work or school. There are three different ways to classify insomnia: Difficulty falling asleep. Difficulty staying asleep. Waking up too early in the morning. Any type of insomnia can be long-term (chronic) or short-term (acute). Both are common. Short-term insomnia usually lasts for three months or less. Chronic insomnia occurs at least three times a week for longer than three months. What are the causes? Insomnia may be caused by another condition, situation, or substance, such as: Anxiety. Certain medicines. Gastroesophageal reflux disease (GERD) or other gastrointestinal conditions. Asthma or other breathing conditions. Restless legs syndrome, sleep apnea, or other sleep disorders. Chronic pain. Menopause. Stroke. Abuse of alcohol, tobacco, or illegal drugs. Mental health conditions, such as depression. Caffeine. Neurological disorders, such as Alzheimer's disease. An overactive thyroid (hyperthyroidism). Sometimes, the cause of insomnia may not be known. What increases the risk? Risk factors for insomnia include: Gender. Women are affected more often than men. Age. Insomnia is more common as you get older. Stress. Lack of exercise. Irregular work schedule or working night shifts. Traveling between different time zones. Certain medical and mental health conditions. What are the signs or symptoms? If you have insomnia, the main symptom is having trouble falling asleep or having trouble staying asleep. This may lead to other symptoms, such as: Feeling fatigued  or having low energy. Feeling nervous about going to sleep. Not feeling rested in the morning. Having trouble concentrating. Feeling irritable, anxious, or depressed. How is this diagnosed? This condition may be diagnosed based on: Your symptoms and medical history. Your health care provider may ask about: Your sleep habits. Any medical conditions you have. Your mental health. A physical exam. How is this treated? Treatment for insomnia depends on the cause. Treatment may focus on treating an underlying condition that is causing insomnia. Treatment may also include: Medicines to help you sleep. Counseling or therapy. Lifestyle adjustments to help you sleep better. Follow these instructions at home: Eating and drinking  Limit or avoid alcohol, caffeinated beverages, and cigarettes, especially close to bedtime. These can disrupt your sleep. Do not eat a large meal or eat spicy foods right before bedtime. This can lead to digestive discomfort that can make it hard for you to sleep. Sleep habits  Keep a sleep diary to help you and your health care provider figure out what could be causing your insomnia. Write down: When you sleep. When you wake up during the night. How well you sleep. How rested you feel the next day. Any side effects of medicines you are taking. What you eat and drink. Make your bedroom a dark, comfortable place where it is easy to fall asleep. Put up shades or blackout curtains to block light from outside. Use a white noise machine to block noise. Keep the temperature cool. Limit screen use before bedtime. This includes: Watching TV. Using your smartphone, tablet, or computer. Stick to a routine that includes going to bed and waking up at the same times every day and night. This can help you fall asleep faster. Consider making a quiet activity, such as reading,  part of your nighttime routine. Try to avoid taking naps during the day so that you sleep better at  night. Get out of bed if you are still awake after 15 minutes of trying to sleep. Keep the lights down, but try reading or doing a quiet activity. When you feel sleepy, go back to bed. General instructions Take over-the-counter and prescription medicines only as told by your health care provider. Exercise regularly, as told by your health care provider. Avoid exercise starting several hours before bedtime. Use relaxation techniques to manage stress. Ask your health care provider to suggest some techniques that may work well for you. These may include: Breathing exercises. Routines to release muscle tension. Visualizing peaceful scenes. Make sure that you drive carefully. Avoid driving if you feel very sleepy. Keep all follow-up visits as told by your health care provider. This is important. Contact a health care provider if: You are tired throughout the day. You have trouble in your daily routine due to sleepiness. You continue to have sleep problems, or your sleep problems get worse. Get help right away if: You have serious thoughts about hurting yourself or someone else. If you ever feel like you may hurt yourself or others, or have thoughts about taking your own life, get help right away. You can go to your nearest emergency department or call: Your local emergency services (911 in the [place] A suicide crisis helpline, such as the [facility] at 706 262 6446. This is open 24 hours a day. Summary Insomnia is a sleep disorder that makes it difficult to fall asleep or stay asleep. Insomnia can be long-term (chronic) or short-term (acute). Treatment for insomnia depends on the cause. Treatment may focus on treating an underlying condition that is causing insomnia. Keep a sleep diary to help you and your health care provider figure out what could be causing your insomnia. This information is not intended to replace advice given to you by your health care provider.  Make sure you discuss any questions you have with your health care provider. Document Released: 08/21/2000 Document Revised: 08/06/2017 Document Reviewed: 06/03/2017 Elsevier Patient Education  2020 [employer]. .

## 2019-05-30 NOTE — Progress Notes (Signed)
Patient: Ernest James Ernest James. Male    DOB: 15-Mar-1936   83 y.o.   MRN: [id number] Visit Date: 05/30/2019  Today's Provider: Lelon Huh, MD   Chief Complaint  Patient presents with  . Follow-up   Subjective:     HPI  Diabetes Mellitus Type II, Follow-up:   Lab Results  Component Value Date   HGBA1C 6.3 (A) 11/14/2018   HGBA1C 7.1 (A) 03/31/2018   HGBA1C 6.8 09/30/2017    Last seen for diabetes 6 months ago.  Management since then includes no changes. He reports good compliance with treatment. He is not having side effects.  Current symptoms include none and have been stable. Home blood sugar records: blood sugars are not checked  Episodes of hypoglycemia? no    Most Recent Eye Exam: UTD Weight trend: fluctuating a bit Prior visit with dietician: No Current exercise: none Current diet habits: well balanced  Pertinent Labs:    Component Value Date/Time   CHOL 107 03/31/2018 0945   TRIG 169 (H) 03/31/2018 0945   HDL 40 03/31/2018 0945   LDLCALC 33 03/31/2018 0945   CREATININE 1.24 03/31/2018 0945    Wt Readings from Last 3 Encounters:  05/30/19 197 lb (89.4 kg)  02/16/19 193 lb 6.4 oz (87.7 kg)  01/12/19 199 lb 4.8 oz (90.4 kg)    ------------------------------------------------------------------------  Hypertension, follow-up:  BP Readings from Last 3 Encounters:  05/30/19 130/62  02/16/19 (!) 142/76  01/11/19 (!) 152/73    He was last seen for hypertension 1 years ago.  BP at that visit was 148/62. Management since that visit includes increasing Losartan to 50mg  daily. He reports good compliance with treatment. He is not having side effects.  He is not exercising. He is not adherent to low salt diet.   Outside blood pressures are not being checked. He is experiencing none.  Patient denies chest pain, chest pressure/discomfort, claudication, dyspnea, exertional chest pressure/discomfort, fatigue, irregular heart beat, lower  extremity edema, near-syncope, orthopnea, palpitations, paroxysmal nocturnal dyspnea, syncope and tachypnea.   Cardiovascular risk factors include advanced age (older than 40 for men, 53 for women), hypertension and male gender.  Use of agents associated with hypertension: none.     Weight trend: fluctuating a bit Wt Readings from Last 3 Encounters:  05/30/19 197 lb (89.4 kg)  02/16/19 193 lb 6.4 oz (87.7 kg)  01/12/19 199 lb 4.8 oz (90.4 kg)    Current diet: well balanced  ------------------------------------------------------------------------  Follow up for Hypothyroidism:  The patient was last seen for this 1 years ago. Changes made at last visit include none.  He reports good compliance with treatment. He feels that condition is stable. He is not having side effects.   ------------------------------------------------------------------------------------  Lipid/Cholesterol, Follow-up:   Last seen for this1 years ago.  Management changes since that visit include none. . Last Lipid Panel:    Component Value Date/Time   CHOL 107 03/31/2018 0945   TRIG 169 (H) 03/31/2018 0945   HDL 40 03/31/2018 0945   CHOLHDL 2.7 03/31/2018 0945   LDLCALC 33 03/31/2018 0945    Risk factors for vascular disease include diabetes mellitus  He reports good compliance with treatment. He is not having side effects.  Current symptoms include none and have been stable. Weight trend: fluctuating a bit Prior visit with dietician: no Current diet: well balanced Current exercise: none  Wt Readings from Last 3 Encounters:  05/30/19 197 lb (89.4 kg)  02/16/19 193 lb 6.4  oz (87.7 kg)  01/12/19 199 lb 4.8 oz (90.4 kg)    -------------------------------------------------------------------  States he's been having trouble sleeping more than an hour or two and void.And having frequent urination during the day. Is taking Flomax just before going to bed at night, but still has some hesitancy  throughout the day and night.    No Known Allergies   Current Outpatient Medications:  .  aspirin EC 81 MG tablet, Take 81 mg by mouth daily., Disp: , Rfl:  .  atorvastatin (LIPITOR) 40 MG tablet, TAKE 1 TABLET BY MOUTH ONCE DAILY, Disp: 30 tablet, Rfl: 12 .  Cholecalciferol (VITAMIN D3) 20 MCG TABS, Take 1 tablet by mouth daily. , Disp: , Rfl:  .  Cyanocobalamin (VITAMIN B12) 1000 MCG TBCR, Take 1 tablet by mouth daily. , Disp: , Rfl:  .  levothyroxine (SYNTHROID, LEVOTHROID) 75 MCG tablet, Take 1 tablet (75 mcg total) by mouth daily before breakfast., Disp: 90 tablet, Rfl: 4 .  losartan (COZAAR) 50 MG tablet, TAKE 1 TABLET BY MOUTH ONCE DAILY, Disp: 90 tablet, Rfl: 4 .  metFORMIN (GLUCOPHAGE-XR) 500 MG 24 hr tablet, TAKE 1 TABLET BY MOUTH ONCE DAILY WITH BREAKFAST, Disp: 90 tablet, Rfl: 4 .  Multiple Vitamins-Minerals (MULTIVITAMIN ADULT PO), Take 1 tablet by mouth daily., Disp: , Rfl:  .  omeprazole (PRILOSEC) 20 MG capsule, Take 20 mg by mouth daily. PRN, Disp: , Rfl:  .  tamsulosin (FLOMAX) 0.4 MG CAPS capsule, TAKE 1 CAPSULE BY MOUTH ONCE DAILY AFTER SUPPER, Disp: 90 capsule, Rfl: 4 .  triamterene-hydrochlorothiazide (MAXZIDE) 75-50 MG tablet, Take 1 tablet by mouth daily., Disp: 90 tablet, Rfl: 1  Review of Systems  Constitutional: Negative for appetite change, chills, fatigue and fever.  HENT: Negative for congestion, ear pain, hearing loss, nosebleeds and trouble swallowing.   Eyes: Negative for pain and visual disturbance.  Respiratory: Negative for cough, chest tightness and shortness of breath.   Cardiovascular: Negative for chest pain, palpitations and leg swelling.  Gastrointestinal: Negative for abdominal pain, blood in stool, constipation, diarrhea, nausea and vomiting.  Endocrine: Negative for polydipsia, polyphagia and polyuria.  Genitourinary: Positive for frequency. Negative for dysuria and flank pain.  Musculoskeletal: Negative for arthralgias, back pain, joint  swelling, myalgias and neck stiffness.  Skin: Negative for color change, rash and wound.  Neurological: Negative for dizziness, tremors, seizures, speech difficulty, weakness, light-headedness and headaches.  Psychiatric/Behavioral: Positive for sleep disturbance. Negative for behavioral problems, confusion, decreased concentration and dysphoric mood. The patient is not nervous/anxious.   All other systems reviewed and are negative.   Social History   Tobacco Use  . Smoking status: Never Smoker  . Smokeless tobacco: Never Used  Substance Use Topics  . Alcohol use: No    Alcohol/week: 0.0 standard drinks      Objective:   BP 130/62 (BP Location: Left Arm, Patient Position: Sitting, Cuff Size: Large)   Pulse 66   Temp (!) 97.3 F (36.3 C) (Temporal)   Resp 16   Ht 5\' 10"  (1.778 m)   Wt 197 lb (89.4 kg)   SpO2 97% Comment: room air  BMI 28.27 kg/m  Vitals:   05/30/19 1412  BP: 130/62  Pulse: 66  Resp: 16  Temp: (!) 97.3 F (36.3 C)  TempSrc: Temporal  SpO2: 97%  Weight: 197 lb (89.4 kg)  Height: 5\' 10"  (1.778 m)  Body mass index is 28.27 kg/m.   Physical Exam   General Appearance:    Alert, cooperative, no  distress  Eyes:    PERRL, conjunctiva/corneas clear, EOM's intact       Lungs:     Clear to auscultation bilaterally, respirations unlabored  Heart:    Normal heart rate. Normal rhythm. No murmurs, rubs, or gallops.   MS:   All extremities are intact.   Neurologic:   Awake, alert, oriented x 3. No apparent focal neurological           defect.           Assessment & Plan    1. Type 2 diabetes mellitus without complication, without long-term current use of insulin (HCC)  - Hemoglobin A1c - glucose blood (ONETOUCH VERIO) test strip; Use as instructed  Dispense: 100 each; Refill: 12  2. Bilateral carotid artery stenosis Asymptomatic. Compliant with medication.  Continue aggressive risk factor modification.    3. Benign essential HTN Well controlled.    Chief Complaint  Patient presents with  . Follow-up    - EKG 12-Lead  4. Hypothyroidism, unspecified type  - TSH  5. Pure hyperglyceridemia He is tolerating atorvastatin well with no adverse effects.   - Comprehensive metabolic panel - Lipid panel - CBC  6. Mixed hyperlipidemia He is tolerating atorvastatin well with no adverse effects.    7. Need for influenza vaccination  - Flu Vaccine QUAD High Dose(Fluad)  8. Other eczema He has occasional mild flaking and itching and has old prescription of - triamcinolone cream (KENALOG) 0.1 %; Apply 1 application topically 2 (two) times daily.  Dispense: 30 g; Refill: 0 which he requested by refilled   9. Benign prostatic hyperplasia with urinary hesitancy This does not seem to be well controlled and is likely contributing to insomnia. Increase to- tamsulosin (FLOMAX) 0.4 MG CAPS capsule; Take 1 capsule (0.4 mg total) by mouth 2 (two) times daily.  Dispense: 3 capsule; Refill: 0  10. Insomnia, unspecified type Likely secondary to BPH. Printed information on sleep hygeine. Consider trazodone if still having trouble sleeping when BPH sx improved.   11. 1st degree AV block Asymptomatic.   The entirety of the information documented in the History of Present Illness, Review of Systems and Physical Exam were personally obtained by me. Portions of this information were initially documented by Meyer Cory, CMA and reviewed by me for thoroughness and accuracy.      Lelon Huh, MD  [facility]

## 2019-05-31 LAB — CBC
Hematocrit: 42.1 % (ref 37.5–51.0)
Hemoglobin: 14.1 g/dL (ref 13.0–17.7)
MCH: 30.7 pg (ref 26.6–33.0)
MCHC: 33.5 g/dL (ref 31.5–35.7)
MCV: 92 fL (ref 79–97)
Platelets: 302 10*3/uL (ref 150–450)
RBC: 4.59 x10E6/uL (ref 4.14–5.80)
RDW: 12.9 % (ref 11.6–15.4)
WBC: 8.3 10*3/uL (ref 3.4–10.8)

## 2019-05-31 LAB — COMPREHENSIVE METABOLIC PANEL
ALT: 32 IU/L (ref 0–44)
AST: 29 IU/L (ref 0–40)
Albumin/Globulin Ratio: 1.3 (ref 1.2–2.2)
Albumin: 4.1 g/dL (ref 3.6–4.6)
Alkaline Phosphatase: 54 IU/L (ref 39–117)
BUN/Creatinine Ratio: 16 (ref 10–24)
BUN: 17 mg/dL (ref 8–27)
Bilirubin Total: 0.7 mg/dL (ref 0.0–1.2)
CO2: 22 mmol/L (ref 20–29)
Calcium: 10.3 mg/dL — ABNORMAL HIGH (ref 8.6–10.2)
Chloride: 100 mmol/L (ref 96–106)
Creatinine, Ser: 1.05 mg/dL (ref 0.76–1.27)
GFR calc Af Amer: 77 mL/min/{1.73_m2} (ref >59)
GFR calc non Af Amer: 66 mL/min/{1.73_m2} (ref >59)
Globulin, Total: 3.1 g/dL (ref 1.5–4.5)
Glucose: 118 mg/dL — ABNORMAL HIGH (ref 65–99)
Potassium: 3.9 mmol/L (ref 3.5–5.2)
Sodium: 138 mmol/L (ref 134–144)
Total Protein: 7.2 g/dL (ref 6.0–8.5)

## 2019-05-31 LAB — LIPID PANEL
Chol/HDL Ratio: 2.5 ratio (ref 0.0–5.0)
Cholesterol, Total: 104 mg/dL (ref 100–199)
HDL: 41 mg/dL (ref >39)
LDL Chol Calc (NIH): 38 mg/dL (ref 0–99)
Triglycerides: 149 mg/dL (ref 0–149)
VLDL Cholesterol Cal: 25 mg/dL (ref 5–40)

## 2019-05-31 LAB — TSH: TSH: 4.08 u[IU]/mL (ref 0.450–4.500)

## 2019-05-31 LAB — HEMOGLOBIN A1C
Est. average glucose Bld gHb Est-mCnc: 134 mg/dL
Hgb A1c MFr Bld: 6.3 % — ABNORMAL HIGH (ref 4.8–5.6)

## 2019-06-01 ENCOUNTER — Telehealth: Payer: Self-pay

## 2019-06-01 NOTE — Telephone Encounter (Signed)
-----   Message from Birdie Sons, MD sent at 05/31/2019  9:14 PM EDT ----- Blood sugar, kidney functions, cholesterol, electrolytes are all normal. Continue current medications.  Check yearly.

## 2019-06-01 NOTE — Telephone Encounter (Signed)
Mailbox is full and unable to take messages  

## 2019-06-05 NOTE — Telephone Encounter (Signed)
Patient advised.

## 2019-06-12 ENCOUNTER — Other Ambulatory Visit: Payer: Self-pay | Admitting: Family Medicine

## 2019-07-19 ENCOUNTER — Other Ambulatory Visit: Payer: Self-pay | Admitting: Family Medicine

## 2019-08-09 ENCOUNTER — Other Ambulatory Visit: Payer: Self-pay

## 2019-08-09 ENCOUNTER — Ambulatory Visit (INDEPENDENT_AMBULATORY_CARE_PROVIDER_SITE_OTHER): Payer: Medicare Other | Admitting: Adult Health

## 2019-08-09 ENCOUNTER — Other Ambulatory Visit: Payer: Self-pay | Admitting: Adult Health

## 2019-08-09 ENCOUNTER — Ambulatory Visit: Payer: Medicare Other | Admitting: Adult Health

## 2019-08-09 ENCOUNTER — Telehealth: Payer: Self-pay | Admitting: Family Medicine

## 2019-08-09 ENCOUNTER — Encounter: Payer: Self-pay | Admitting: Adult Health

## 2019-08-09 VITALS — BP 122/70 | HR 102 | Temp 97.1°F | Resp 16 | Wt 203.4 lb

## 2019-08-09 DIAGNOSIS — N401 Enlarged prostate with lower urinary tract symptoms: Secondary | ICD-10-CM

## 2019-08-09 DIAGNOSIS — B3749 Other urogenital candidiasis: Secondary | ICD-10-CM | POA: Diagnosis not present

## 2019-08-09 MED ORDER — TAMSULOSIN HCL 0.4 MG PO CAPS: 0.4000 mg | ORAL_CAPSULE | Freq: Two times a day (BID) | ORAL | 0 refills | Status: DC

## 2019-08-09 MED ORDER — NYSTATIN 100000 UNIT/GM EX CREA: 1.0000 "application " | TOPICAL_CREAM | Freq: Two times a day (BID) | CUTANEOUS | 0 refills | Status: DC

## 2019-08-09 MED ORDER — ONETOUCH ULTRASOFT LANCETS MISC: 12 refills | Status: DC

## 2019-08-09 NOTE — Patient Instructions (Signed)
Monitor Blood glucose.  May use over the counter jock itch powder in underwear if needed if sweating etc.   Nystatin skin cream or ointment What is this medicine? NYSTATIN (nye STAT in) is an antifungal medicine. It is used to treat certain kinds of fungal or yeast infections of the skin. This medicine may be used for other purposes; ask your health care provider or pharmacist if you have questions. COMMON BRAND NAME(S): Mycostatin, Nystex, Pediaderm AF What should I tell my health care provider before I take this medicine? They need to know if you have any of these conditions:  an unusual or allergic reaction to nystatin, other foods, dyes or preservatives  pregnant or trying to get pregnant  breast-feeding How should I use this medicine? This medicine is for external use on the skin only. Follow the directions on the prescription label. Wash hands before and after use. If treating a hand or nail infection, wash hands before use only. Apply a thin layer of this medicine to cover the affected skin and surrounding area. You can cover the area with a sterile gauze dressing (bandage). Do not use an airtight bandage (such as a plastic-covered bandage). Do not get the medicine in your eyes. If you do, rinse out with plenty of cool tap water. Use the full course of treatment prescribed, even if you think the infection is getting better. Use at regular intervals. Do not use your medicine more often than directed. Do not use this medicine for any condition other than the one for which it was prescribed. Talk to your pediatrician regarding the use of this medicine in children. Special care may be needed. Overdosage: If you think you have taken too much of this medicine contact a poison control center or emergency room at once. NOTE: This medicine is only for you. Do not share this medicine with others. What if I miss a dose? If you miss a dose, use it as soon as you can. If it is almost time for your  next dose, use only that dose. Do not use double or extra doses. What may interact with this medicine? Interactions are not expected. Do not use any other skin products on the affected area without telling your doctor or health care professional. This list may not describe all possible interactions. Give your health care provider a list of all the medicines, herbs, non-prescription drugs, or dietary supplements you use. Also tell them if you smoke, drink alcohol, or use illegal drugs. Some items may interact with your medicine. What should I watch for while using this medicine? Tell your doctor or health care professional if your symptoms do not improve after 3 days. After bathing make sure that your skin is very dry. Fungal infections like moist conditions. Do not walk around barefoot. To help prevent reinfection, wear freshly washed cotton, not synthetic, clothing. What side effects may I notice from receiving this medicine? Side effects that usually do not require medical attention (report to your doctor or health care professional if they continue or are bothersome):  skin irritation This list may not describe all possible side effects. Call your doctor for medical advice about side effects. You may report side effects to FDA at 1-800-FDA-1088. Where should I keep my medicine? Keep out of the reach of children. Store at room temperature 15 to 30 degrees C (59 to 86 degrees F). Throw away any unused medicine after the expiration date. NOTE: This sheet is a summary. It may not cover  all possible information. If you have questions about this medicine, talk to your doctor, pharmacist, or health care provider.  2020 Elsevier/Gold Standard (2015-09-25 16:28:30) Skin Yeast Infection  A skin yeast infection is a condition in which there is an overgrowth of yeast (candida) that normally lives on the skin. This condition usually occurs in areas of the skin that are constantly warm and moist, such as  the armpits or the groin. What are the causes? This condition is caused by a change in the normal balance of the yeast and bacteria that live on the skin. What increases the risk? You are more likely to develop this condition if you:  Are obese.  Are pregnant.  Take birth control pills.  Have diabetes.  Take antibiotic medicines.  Take steroid medicines.  Are malnourished.  Have a weak body defense system (immune system).  Are 81 years of age or older.  Wear tight clothing. What are the signs or symptoms? The most common symptom of this condition is itchiness in the affected area. Other symptoms include:  Red, swollen area of the skin.  Bumps on the skin. How is this diagnosed?  This condition is diagnosed with a medical history and physical exam.  Your health care provider may check for yeast by taking light scrapings of the skin to be viewed under a microscope. How is this treated? This condition is treated with medicine. Medicines may be prescribed or be available over the counter. The medicines may be:  Taken by mouth (orally).  Applied as a cream or powder to your skin. Follow these instructions at home:   Take or apply over-the-counter and prescription medicines only as told by your health care provider.  Maintain a healthy weight. If you need help losing weight, talk with your health care provider.  Keep your skin clean and dry.  If you have diabetes, keep your blood sugar under control.  Keep all follow-up visits as told by your health care provider. This is important. Contact a health care provider if:  Your symptoms go away and then return.  Your symptoms do not get better with treatment.  Your symptoms get worse.  Your rash spreads.  You have a fever or chills.  You have new symptoms.  You have new warmth or redness of your skin. Summary  A skin yeast infection is a condition in which there is an overgrowth of yeast (candida) that  normally lives on the skin. This condition is caused by a change in the normal balance of the yeast and bacteria that live on the skin.  Take or apply over-the-counter and prescription medicines only as told by your health care provider.  Keep your skin clean and dry.  Contact a health care provider if your symptoms do not get better with treatment. This information is not intended to replace advice given to you by your health care provider. Make sure you discuss any questions you have with your health care provider. Document Released: 05/12/2011 Document Revised: 01/11/2018 Document Reviewed: 01/11/2018 Elsevier Patient Education  2020 [employer].

## 2019-08-09 NOTE — Progress Notes (Signed)
Patient: [facility] Ernest James. Male    DOB: 12-28-1935   83 y.o.   MRN: [id number] Visit Date: 08/09/2019  Today's Provider: Marcille Buffy, FNP   Chief Complaint  Patient presents with  . Rash   Subjective:     HPI Patient returns to office today to address itching in his groin area for over 3 months. Patient states that he previously saw Dr. Caryn Section in September for same problem and states that he was prescribed Triamcinolone cream. Patient reports that cream has worked before in past but states in the recent weeks itching in his groin area has turned into a burning sensation.  He has been using triamcinolone cream over the past few weeks to the area and he reports it seems to have gotten worse over the past few days.   He denies pain.  He denies any urinary symptoms, dysuria. He is diabetic and has not been checking his blood sugars at home.  I did refill his lancets today.  Also sent in a refill for his Flomax for 30-day supply, as he had the Flomax dosage increased to 0.4 mg taking 1 capsule by mouth twice daily at last office visit on 05/30/2019.  Reports this is working well.  He has no concerns. He denies any trauma/ injury. Denies any abnormal discharge or concerns for STD's.   Patient  denies any fever, body aches,chills, rash, chest pain, shortness of breath, nausea, vomiting, or diarrhea.   No Known Allergies   Current Outpatient Medications:  .  aspirin EC 81 MG tablet, Take 81 mg by mouth daily., Disp: , Rfl:  .  atorvastatin (LIPITOR) 40 MG tablet, Take 1 tablet by mouth once daily, Disp: 30 tablet, Rfl: 12 .  Cholecalciferol (VITAMIN D3) 20 MCG TABS, Take 1 tablet by mouth daily. , Disp: , Rfl:  .  Cyanocobalamin (VITAMIN B12) 1000 MCG TBCR, Take 1 tablet by mouth daily. , Disp: , Rfl:  .  glucose blood (ONETOUCH VERIO) test strip, Use as instructed, Disp: 100 each, Rfl: 12 .  Lancets (ONETOUCH ULTRASOFT) lancets, Use as instructed, Disp: 100  each, Rfl: 12 .  levothyroxine (SYNTHROID, LEVOTHROID) 75 MCG tablet, Take 1 tablet (75 mcg total) by mouth daily before breakfast., Disp: 90 tablet, Rfl: 4 .  losartan (COZAAR) 50 MG tablet, TAKE 1 TABLET BY MOUTH ONCE DAILY, Disp: 90 tablet, Rfl: 4 .  metFORMIN (GLUCOPHAGE-XR) 500 MG 24 hr tablet, Take 1 tablet by mouth once daily with breakfast, Disp: 90 tablet, Rfl: 0 .  Multiple Vitamins-Minerals (MULTIVITAMIN ADULT PO), Take 1 tablet by mouth daily., Disp: , Rfl:  .  omeprazole (PRILOSEC) 20 MG capsule, Take 20 mg by mouth daily. PRN, Disp: , Rfl:  .  tamsulosin (FLOMAX) 0.4 MG CAPS capsule, Take 1 capsule (0.4 mg total) by mouth 2 (two) times daily., Disp: 60 capsule, Rfl: 0 .  triamcinolone cream (KENALOG) 0.1 %, Apply 1 application topically 2 (two) times daily., Disp: 30 g, Rfl: 0 .  triamterene-hydrochlorothiazide (MAXZIDE) 75-50 MG tablet, Take 1 tablet by mouth daily., Disp: 90 tablet, Rfl: 1  Review of Systems  Constitutional: Negative.   Respiratory: Negative.   Cardiovascular: Negative.   Gastrointestinal: Negative.   Genitourinary: Positive for penile pain (skin at tip of penis ). Negative for decreased urine volume, difficulty urinating, discharge, dysuria, enuresis, flank pain, frequency, genital sores, hematuria, penile swelling, scrotal swelling, testicular pain and urgency.  Skin: Positive for color change and rash. Negative  for pallor and wound.  Neurological: Negative.   Psychiatric/Behavioral: Negative.     Social History   Tobacco Use  . Smoking status: Never Smoker  . Smokeless tobacco: Never Used  Substance Use Topics  . Alcohol use: No    Alcohol/week: 0.0 standard drinks      Objective:    Blood pressure 122/70, pulse (!) 102, temperature (!) 97.1 F (36.2 C), temperature source Oral, resp. rate 16, weight 203 lb 6.4 oz (92.3 kg). Recheck heart rate 82.    Physical Exam Constitutional:      General: He is not in acute distress.    Appearance:  Normal appearance. He is not ill-appearing, toxic-appearing or diaphoretic.  HENT:     Head: Normocephalic and atraumatic.  Neck:     Musculoskeletal: Normal range of motion and neck supple.  Cardiovascular:     Rate and Rhythm: Normal rate and regular rhythm.     Pulses: Normal pulses.     Heart sounds: Normal heart sounds. No murmur. No friction rub. No gallop.   Pulmonary:     Effort: Pulmonary effort is normal.     Breath sounds: Normal breath sounds.  Abdominal:     Palpations: Abdomen is soft.  Skin:    General: Skin is warm.     Capillary Refill: Capillary refill takes less than 2 seconds.     Coloration: Skin is not jaundiced or pale.     Findings: Erythema and rash present. No bruising or lesion.     Comments: Head of penis and foreskin with mild erythema, moist skin.    Neurological:     Mental Status: He is alert and oriented to person, place, and time.  Psychiatric:        Mood and Affect: Mood normal.        Behavior: Behavior normal.        Thought Content: Thought content normal.        Judgment: Judgment normal.      No results found for any visits on 08/09/19.     Assessment & Plan     1. Yeast dermatitis of penis Meds ordered this encounter  Medications  . nystatin cream (MYCOSTATIN)    Sig: Apply 1 application topically 2 (two) times daily.    Dispense:  30 g    Refill:  0    Discussed may use jock itch powder in underwear in  the future to help prevent if sweating a lot.   Return in 2 weeks if needed, sooner if worsening or not imprving or any new symptoms develop. Advised patient call the office or your primary care doctor for an appointment if no improvement within 72 hours or if any symptoms change or worsen at any time  Advised ER or urgent Care if after hours or on weekend. Call 911 for emergency symptoms at any time.Patinet verbalized understanding of all instructions given/reviewed and treatment plan and has no further questions or concerns  at this time.     The entirety of the information documented in the History of Present Illness, Review of Systems and Physical Exam were personally obtained by me. Portions of this information were initially documented by the  Certified Medical Assistant whose name is documented in [place] and reviewed by me for thoroughness and accuracy.  I have personally performed the exam and reviewed the chart and it is accurate to the best of my knowledge.  Haematologist has been used and any errors in dictation or  transcription are unintentional.  Kelby Aline. Forbes, [facility]

## 2019-08-09 NOTE — Telephone Encounter (Signed)
Pt was told to double up on flomax 0.4 mg. Pt is taking 2 capsules instead of 1 capsule. Pt needs new rx flomax 0.4 mg 2 capsules a day . Pt also needs one touch verio lancets  [facility] in [place]

## 2019-08-09 NOTE — Telephone Encounter (Signed)
Script for lancets and Flomax sent to his pharmacy

## 2019-08-09 NOTE — Progress Notes (Signed)
Meds ordered this encounter  Medications  . Lancets (ONETOUCH ULTRASOFT) lancets    Sig: Use as instructed    Dispense:  100 each    Refill:  12  . tamsulosin (FLOMAX) 0.4 MG CAPS capsule    Sig: Take 1 capsule (0.4 mg total) by mouth 2 (two) times daily.    Dispense:  60 capsule    Refill:  0    Refills requested sent to pharmacy.

## 2019-08-09 NOTE — Progress Notes (Deleted)
       Patient: Ernest James. Male    DOB: 1936-05-09   83 y.o.   MRN: [id number] Visit Date: 08/09/2019  Today's Provider: Marcille Buffy, FNP   No chief complaint on file.  Subjective:     HPI  Follow up for Eczema  The patient was last seen for this 3 months ago. Changes made at last visit include prescribed Kenalog for mild itching and flaking of skin.  He reports {excellent/good/fair/poor:19665} compliance with treatment. He feels that condition is {improved/worse/unchanged:[id number]}. He {ACTION; IS/IS [id number] having side effects. ***  ------------------------------------------------------------------------------------  No Known Allergies   Current Outpatient Medications:  .  aspirin EC 81 MG tablet, Take 81 mg by mouth daily., Disp: , Rfl:  .  atorvastatin (LIPITOR) 40 MG tablet, Take 1 tablet by mouth once daily, Disp: 30 tablet, Rfl: 12 .  Cholecalciferol (VITAMIN D3) 20 MCG TABS, Take 1 tablet by mouth daily. , Disp: , Rfl:  .  Cyanocobalamin (VITAMIN B12) 1000 MCG TBCR, Take 1 tablet by mouth daily. , Disp: , Rfl:  .  glucose blood (ONETOUCH VERIO) test strip, Use as instructed, Disp: 100 each, Rfl: 12 .  levothyroxine (SYNTHROID, LEVOTHROID) 75 MCG tablet, Take 1 tablet (75 mcg total) by mouth daily before breakfast., Disp: 90 tablet, Rfl: 4 .  losartan (COZAAR) 50 MG tablet, TAKE 1 TABLET BY MOUTH ONCE DAILY, Disp: 90 tablet, Rfl: 4 .  metFORMIN (GLUCOPHAGE-XR) 500 MG 24 hr tablet, Take 1 tablet by mouth once daily with breakfast, Disp: 90 tablet, Rfl: 0 .  Multiple Vitamins-Minerals (MULTIVITAMIN ADULT PO), Take 1 tablet by mouth daily., Disp: , Rfl:  .  omeprazole (PRILOSEC) 20 MG capsule, Take 20 mg by mouth daily. PRN, Disp: , Rfl:  .  tamsulosin (FLOMAX) 0.4 MG CAPS capsule, Take 1 capsule (0.4 mg total) by mouth 2 (two) times daily., Disp: 3 capsule, Rfl: 0 .  triamcinolone cream (KENALOG) 0.1 %, Apply 1 application topically 2  (two) times daily., Disp: 30 g, Rfl: 0 .  triamterene-hydrochlorothiazide (MAXZIDE) 75-50 MG tablet, Take 1 tablet by mouth daily., Disp: 90 tablet, Rfl: 1  Review of Systems  Social History   Tobacco Use  . Smoking status: Never Smoker  . Smokeless tobacco: Never Used  Substance Use Topics  . Alcohol use: No    Alcohol/week: 0.0 standard drinks      Objective:   There were no vitals taken for this visit. There were no vitals filed for this visit.There is no height or weight on file to calculate BMI.   Physical Exam   No results found for any visits on 08/09/19.     [facility] [place]

## 2019-08-13 ENCOUNTER — Other Ambulatory Visit: Payer: Self-pay | Admitting: Family Medicine

## 2019-08-17 ENCOUNTER — Other Ambulatory Visit: Payer: Self-pay | Admitting: Adult Health

## 2019-08-17 DIAGNOSIS — E119 Type 2 diabetes mellitus without complications: Secondary | ICD-10-CM

## 2019-08-17 MED ORDER — ONETOUCH ULTRASOFT LANCETS MISC: 12 refills | Status: DC

## 2019-08-17 MED ORDER — ONETOUCH VERIO VI STRP: ORAL_STRIP | 12 refills | Status: DC

## 2019-08-25 ENCOUNTER — Other Ambulatory Visit: Payer: Self-pay | Admitting: Family Medicine

## 2019-08-25 DIAGNOSIS — N401 Enlarged prostate with lower urinary tract symptoms: Secondary | ICD-10-CM

## 2019-08-25 MED ORDER — TAMSULOSIN HCL 0.4 MG PO CAPS: 0.4000 mg | ORAL_CAPSULE | Freq: Two times a day (BID) | ORAL | 12 refills | Status: DC

## 2019-08-25 NOTE — Telephone Encounter (Signed)
Please review refill request. Patient states the dose of Tamsulosin is supposed to be doubled.

## 2019-08-25 NOTE — Telephone Encounter (Signed)
Copied from [place] [id number]. Topic: Quick Communication - Rx Refill/Question >> Aug 25, 2019 10:25 AM Rainey Pines A wrote: Medication: Lancets One Touch Verio Flex machine , Flomax (Patient stated that his dosage for flomax should be called in for double dosage.)   Has the patient contacted their pharmacy? Yes (Agent: If no, request that the patient contact the pharmacy for the refill.) (Agent: If yes, when and what did the pharmacy advise?)Contact PCP Preferred Pharmacy (with phone number or street name): [facility], [place] - [place]  Phone:  [phone] Fax:  [phone]     Agent: Please be advised that RX refills may take up to 3 business days. We ask that you follow-up with your pharmacy.

## 2019-08-30 DIAGNOSIS — C44619 Basal cell carcinoma of skin of left upper limb, including shoulder: Secondary | ICD-10-CM | POA: Diagnosis not present

## 2019-09-15 ENCOUNTER — Encounter: Payer: Self-pay | Admitting: *Deleted

## 2019-09-18 ENCOUNTER — Other Ambulatory Visit: Payer: Self-pay | Admitting: Family Medicine

## 2019-09-18 NOTE — Telephone Encounter (Signed)
[place] faxed refill request for the following medications:  levothyroxine (SYNTHROID, LEVOTHROID) 75 MCG tablet  Note from pharmacy: Due to MFG change, need to get an ok to dispense different thyroid pill MFG.  Please advise.  Thanks, [employer]

## 2019-09-18 NOTE — Telephone Encounter (Signed)
Yes, ok to change MFG. thanks

## 2019-09-19 ENCOUNTER — Telehealth: Payer: Self-pay | Admitting: Family Medicine

## 2019-09-19 NOTE — Telephone Encounter (Signed)
Person calling to change manufacture for pt's Levothyroxine.  CB [phone]

## 2019-09-19 NOTE — Telephone Encounter (Signed)
[facility] pharmacy advised.  

## 2019-09-23 DIAGNOSIS — Z23 Encounter for immunization: Secondary | ICD-10-CM | POA: Diagnosis not present

## 2019-10-17 DIAGNOSIS — Z23 Encounter for immunization: Secondary | ICD-10-CM | POA: Diagnosis not present

## 2019-10-26 DIAGNOSIS — M47812 Spondylosis without myelopathy or radiculopathy, cervical region: Secondary | ICD-10-CM | POA: Diagnosis not present

## 2019-10-26 DIAGNOSIS — M542 Cervicalgia: Secondary | ICD-10-CM | POA: Diagnosis not present

## 2019-10-30 DIAGNOSIS — M47812 Spondylosis without myelopathy or radiculopathy, cervical region: Secondary | ICD-10-CM | POA: Diagnosis not present

## 2019-11-01 ENCOUNTER — Other Ambulatory Visit: Payer: Self-pay | Admitting: Family Medicine

## 2019-11-07 DIAGNOSIS — M47812 Spondylosis without myelopathy or radiculopathy, cervical region: Secondary | ICD-10-CM | POA: Diagnosis not present

## 2019-11-13 DIAGNOSIS — M47812 Spondylosis without myelopathy or radiculopathy, cervical region: Secondary | ICD-10-CM | POA: Diagnosis not present

## 2019-11-20 DIAGNOSIS — M47812 Spondylosis without myelopathy or radiculopathy, cervical region: Secondary | ICD-10-CM | POA: Diagnosis not present

## 2019-11-29 DIAGNOSIS — M47812 Spondylosis without myelopathy or radiculopathy, cervical region: Secondary | ICD-10-CM | POA: Diagnosis not present

## 2019-12-19 DIAGNOSIS — C44619 Basal cell carcinoma of skin of left upper limb, including shoulder: Secondary | ICD-10-CM | POA: Diagnosis not present

## 2019-12-21 ENCOUNTER — Other Ambulatory Visit: Payer: Self-pay | Admitting: Family Medicine

## 2019-12-21 DIAGNOSIS — M1711 Unilateral primary osteoarthritis, right knee: Secondary | ICD-10-CM | POA: Diagnosis not present

## 2019-12-21 DIAGNOSIS — M25561 Pain in right knee: Secondary | ICD-10-CM | POA: Diagnosis not present

## 2019-12-21 DIAGNOSIS — E119 Type 2 diabetes mellitus without complications: Secondary | ICD-10-CM | POA: Diagnosis not present

## 2019-12-23 ENCOUNTER — Emergency Department: Payer: Medicare Other

## 2019-12-23 ENCOUNTER — Other Ambulatory Visit: Payer: Self-pay

## 2019-12-23 ENCOUNTER — Emergency Department
Admission: EM | Admit: 2019-12-23 | Discharge: 2019-12-23 | Disposition: A | Discharge status: Home / Self Care | Payer: Medicare Other | Attending: Emergency Medicine | Admitting: Emergency Medicine

## 2019-12-23 DIAGNOSIS — R42 Dizziness and giddiness: Secondary | ICD-10-CM | POA: Diagnosis not present

## 2019-12-23 DIAGNOSIS — I10 Essential (primary) hypertension: Secondary | ICD-10-CM | POA: Insufficient documentation

## 2019-12-23 DIAGNOSIS — Z79899 Other long term (current) drug therapy: Secondary | ICD-10-CM | POA: Insufficient documentation

## 2019-12-23 DIAGNOSIS — Z7982 Long term (current) use of aspirin: Secondary | ICD-10-CM | POA: Insufficient documentation

## 2019-12-23 DIAGNOSIS — Z7984 Long term (current) use of oral hypoglycemic drugs: Secondary | ICD-10-CM | POA: Diagnosis not present

## 2019-12-23 DIAGNOSIS — E119 Type 2 diabetes mellitus without complications: Secondary | ICD-10-CM | POA: Insufficient documentation

## 2019-12-23 LAB — URINALYSIS, COMPLETE (UACMP) WITH MICROSCOPIC
Bilirubin Urine: NEGATIVE
Glucose, UA: NEGATIVE mg/dL
Hgb urine dipstick: NEGATIVE
Ketones, ur: NEGATIVE mg/dL
Leukocytes,Ua: NEGATIVE
Nitrite: NEGATIVE
Protein, ur: NEGATIVE mg/dL
Specific Gravity, Urine: 1.009 (ref 1.005–1.030)
pH: 5 (ref 5.0–8.0)

## 2019-12-23 LAB — CBC
HCT: 39.6 % (ref 39.0–52.0)
Hemoglobin: 14 g/dL (ref 13.0–17.0)
MCH: 31.3 pg (ref 26.0–34.0)
MCHC: 35.4 g/dL (ref 30.0–36.0)
MCV: 88.6 fL (ref 80.0–100.0)
Platelets: 246 10*3/uL (ref 150–400)
RBC: 4.47 MIL/uL (ref 4.22–5.81)
RDW: 12.7 % (ref 11.5–15.5)
WBC: 9.4 10*3/uL (ref 4.0–10.5)
nRBC: 0 % (ref 0.0–0.2)

## 2019-12-23 LAB — BASIC METABOLIC PANEL
Anion gap: 9 (ref 5–15)
BUN: 22 mg/dL (ref 8–23)
CO2: 24 mmol/L (ref 22–32)
Calcium: 9.6 mg/dL (ref 8.9–10.3)
Chloride: 104 mmol/L (ref 98–111)
Creatinine, Ser: 0.95 mg/dL (ref 0.61–1.24)
GFR calc Af Amer: >60 mL/min (ref >60)
GFR calc non Af Amer: >60 mL/min (ref >60)
Glucose, Bld: 108 mg/dL — ABNORMAL HIGH (ref 70–99)
Potassium: 3.5 mmol/L (ref 3.5–5.1)
Sodium: 137 mmol/L (ref 135–145)

## 2019-12-23 LAB — T4, FREE: Free T4: 0.97 ng/dL (ref 0.61–1.12)

## 2019-12-23 LAB — TSH: TSH: 4.31 u[IU]/mL (ref 0.350–4.500)

## 2019-12-23 MED ORDER — MECLIZINE HCL 25 MG PO TABS
25.0000 mg | ORAL_TABLET | Freq: Once | ORAL | Status: AC
  Administered 2019-12-23: 25 mg via ORAL
  Filled 2019-12-23: qty 1

## 2019-12-23 MED ORDER — AMLODIPINE BESYLATE 5 MG PO TABS
5.0000 mg | ORAL_TABLET | Freq: Once | ORAL | Status: AC
  Administered 2019-12-23: 20:00:00 5 mg via ORAL
  Filled 2019-12-23: qty 1

## 2019-12-23 MED ORDER — MECLIZINE HCL 25 MG PO TABS: 25.0000 mg | ORAL_TABLET | Freq: Three times a day (TID) | ORAL | 0 refills | Status: DC | PRN

## 2019-12-23 NOTE — ED Notes (Signed)
Report given to Sherrie, RN 

## 2019-12-23 NOTE — ED Provider Notes (Signed)
[facility] Emergency Department Provider Note  ____________________________________________  Time seen: Approximately 6:17 PM  I have reviewed the triage vital signs and the nursing notes.   HISTORY  Chief Complaint Dizziness    HPI Ernest James Infantof Bozzi. is a 84 y.o. male who presents the emergency department for acute onset of dizziness, anxiety.  Patient states that today he was sitting down, must then had a dizzy spell.  Patient states it almost felt as if he was moving.  He states that the room did not seem like it was moving.  No onset of headache, visual changes, weakness on one side of the body or the other.  Patient states that when this occurred he became very anxious given that he has no history of anxiety or other psychiatric issues.  Patient states that his daughter helped him take his blood pressure which was elevated at a systolic over [id number].  Patient called the nurse line and was advised to present to the emergency department for evaluation.  Currently patient states that he is not dizzy, has no chest pain, shortness of breath, domino pain, nausea or vomiting.  Patient has a history of hypertension but has never had a blood pressure reading over [id number] systolic.  Patient with a history of diabetes, hypertension, hypothyroidism.        Past Medical History:  Diagnosis Date  . Diabetes mellitus without complication ([place])   . GERD (gastroesophageal reflux disease)   . Hypertension   . Phimosis   . Thyroid disease     Patient Active Problem List   Diagnosis Date Noted  . 1st degree AV block 05/30/2019  . Hyperlipidemia 12/16/2017  . Insomnia 03/30/2017  . BPH (benign prostatic hyperplasia) 05/27/2016  . Carotid stenosis 11/27/2015  . TIA (transient ischemic attack) 11/27/2015  . Allergic rhinitis, seasonal 11/22/2015  . Family history of early CAD 11/22/2015  . Phimosis 01/29/2014  . Diabetes mellitus without complication ([place])  [id number]  . Abnormal liver enzymes 03/24/2009  . Pure hyperglyceridemia 03/20/2009  . Hypothyroidism 09/18/2008  . History of adenomatous polyp of colon 09/07/2004  . Benign essential HTN 02/16/2002  . Esophagitis, reflux 02/16/2002    Past Surgical History:  Procedure Laterality Date  . CIRCUMCISION    . COLONOSCOPY WITH PROPOFOL N/A 03/31/2016   Procedure: COLONOSCOPY WITH PROPOFOL;  Surgeon: Lollie Sails, MD;  Location: [facility] ENDOSCOPY;  Service: Endoscopy;  Laterality: N/A;  . SINUS SURGERY  1994   on left side  . TONSILLECTOMY      Prior to Admission medications   Medication Sig Start Date End Date Taking? Authorizing Provider  aspirin EC 81 MG tablet Take 81 mg by mouth daily.    [provider]  atorvastatin (LIPITOR) 40 MG tablet Take 1 tablet by mouth once daily 06/12/19   Birdie Sons, MD  Cholecalciferol (VITAMIN D3) 20 MCG TABS Take 1 tablet by mouth daily.     [provider]  Cyanocobalamin (VITAMIN B12) 1000 MCG TBCR Take 1 tablet by mouth daily.     [provider]  EUTHYROX 75 MCG tablet TAKE 1 TABLET BY MOUTH ONCE DAILY BEFORE  BREAKFAST 12/21/19   Birdie Sons, MD  glucose blood (ONETOUCH VERIO) test strip Test TID. Use as instructed 08/17/19   Flinchum, Kelby Aline, FNP  Lancets [facility] ULTRASOFT) lancets Test TID as instructed 08/17/19   Flinchum, Kelby Aline, FNP  losartan (COZAAR) 50 MG tablet TAKE 1 TABLET BY MOUTH ONCE DAILY 10/07/18  Birdie Sons, MD  meclizine (ANTIVERT) 25 MG tablet Take 1 tablet (25 mg total) by mouth 3 (three) times daily as needed for dizziness. 12/23/19   Edger Husain, Charline Bills, PA-C  metFORMIN (GLUCOPHAGE-XR) 500 MG 24 hr tablet Take 1 tablet by mouth once daily with breakfast 11/01/19   Birdie Sons, MD  Multiple Vitamins-Minerals (MULTIVITAMIN ADULT PO) Take 1 tablet by mouth daily. 03/20/09   [provider]  nystatin cream (MYCOSTATIN) Apply 1 application topically 2 (two) times  daily. 08/09/19   Flinchum, Kelby Aline, FNP  omeprazole (PRILOSEC) 20 MG capsule Take 20 mg by mouth daily. PRN    [provider]  tamsulosin (FLOMAX) 0.4 MG CAPS capsule Take 1 capsule (0.4 mg total) by mouth 2 (two) times daily. 08/25/19   Birdie Sons, MD  triamcinolone cream (KENALOG) 0.1 % Apply 1 application topically 2 (two) times daily. 05/30/19   Birdie Sons, MD  triamterene-hydrochlorothiazide [facility]) 75-50 MG tablet Take 1 tablet by mouth once daily 08/13/19   Birdie Sons, MD    Allergies Patient has no known allergies.  Family History  Problem Relation Age of Onset  . Alzheimer's disease Mother   . CAD Father   . Diabetes Sister        type 2    Social History Social History   Tobacco Use  . Smoking status: Never Smoker  . Smokeless tobacco: Never Used  Substance Use Topics  . Alcohol use: No    Alcohol/week: 0.0 standard drinks  . Drug use: No     Review of Systems  Constitutional: No fever/chills.  Positive for sudden onset of dizziness followed by increased blood pressure. Eyes: No visual changes. No discharge ENT: No upper respiratory complaints. Cardiovascular: no chest pain. Respiratory: no cough. No SOB. Gastrointestinal: No abdominal pain.  No nausea, no vomiting.  No diarrhea.  No constipation. Genitourinary: Negative for dysuria. No hematuria Musculoskeletal: Negative for musculoskeletal pain. Skin: Negative for rash, abrasions, lacerations, ecchymosis. Neurological: Negative for headaches, focal weakness or numbness. 10-point ROS otherwise negative.  ____________________________________________   PHYSICAL EXAM:  VITAL SIGNS: ED Triage Vitals  Enc Vitals Group     BP 12/23/19 1349 (!) 180/92     Pulse Rate 12/23/19 1349 78     Resp 12/23/19 1349 18     Temp 12/23/19 1349 98.4 F (36.9 C)     Temp Source 12/23/19 1349 Oral     SpO2 12/23/19 1349 98 %     Weight 12/23/19 1350 195 lb (88.5 kg)     Height 12/23/19  1350 5\' 10"  (1.778 m)     Head Circumference --      Peak Flow --      Pain Score 12/23/19 1350 0     Pain Loc --      Pain Edu? --      Excl. in [place]? --      Constitutional: Alert and oriented. Well appearing and in no acute distress. Eyes: Conjunctivae are normal. PERRL. EOMI. Head: Atraumatic. ENT:      Ears:       Nose: No congestion/rhinnorhea.      Mouth/Throat: Mucous membranes are moist.  Neck: No stridor.  Neck is supple full range of motion Hematological/Lymphatic/Immunilogical: No cervical lymphadenopathy. Cardiovascular: Normal rate, regular rhythm. Normal S1 and S2.  Good peripheral circulation. Respiratory: Normal respiratory effort without tachypnea or retractions. Lungs CTAB. Good air entry to the bases with no decreased or absent breath sounds.  Musculoskeletal: Full range of motion to all extremities. No gross deformities appreciated. Neurologic:  Normal speech and language. No gross focal neurologic deficits are appreciated.  Cranial nerves II through XII grossly intact.  Negative pronator drift. Skin:  Skin is warm, dry and intact. No rash noted. Psychiatric: Mood and affect are normal. Speech and behavior are normal. Patient exhibits appropriate insight and judgement.   ____________________________________________   LABS (all labs ordered are listed, but only abnormal results are displayed)  Labs Reviewed  BASIC METABOLIC PANEL - Abnormal; Notable for the following components:      Result Value   Glucose, Bld 108 (*)    All other components within normal limits  URINALYSIS, COMPLETE (UACMP) WITH MICROSCOPIC - Abnormal; Notable for the following components:   Color, Urine YELLOW (*)    APPearance CLEAR (*)    All other components within normal limits  CBC  T3  T4, FREE  TSH  CBG MONITORING, ED   ____________________________________________  EKG   ____________________________________________  RADIOLOGY I personally viewed and evaluated these  images as part of my medical decision making, as well as reviewing the written report by the radiologist.  CT Head Wo Contrast  Result Date: 12/23/2019 CLINICAL DATA:  84 year old male with sudden onset of dizziness. EXAM: CT HEAD WITHOUT CONTRAST TECHNIQUE: Contiguous axial images were obtained from the base of the skull through the vertex without intravenous contrast. COMPARISON:  Head CT dated 11/20/2015. FINDINGS: Brain: There is mild age-related atrophy and chronic microvascular ischemic changes. There is no acute intracranial hemorrhage. No mass effect or midline shift. No extra-axial fluid collection. Vascular: No hyperdense vessel or unexpected calcification. Skull: Normal. Negative for fracture or focal lesion. Sinuses/Orbits: There is diffuse mucoperiosteal thickening of paranasal sinuses with the mastoid air cells are clear. No air-fluid level. Other: None IMPRESSION: 1. No acute intracranial pathology. 2. Mild age-related atrophy and chronic microvascular ischemic changes. Electronically Signed   By: Anner Crete M.D.   On: 12/23/2019 19:22    ____________________________________________    PROCEDURES  Procedure(s) performed:    Procedures    Medications  meclizine (ANTIVERT) tablet 25 mg (has no administration in time range)     ____________________________________________   INITIAL IMPRESSION / ASSESSMENT AND PLAN / ED COURSE  Pertinent labs & imaging results that were available during my care of the patient were reviewed by me and considered in my medical decision making (see chart for details).  Review of the [place] CSRS was performed in accordance of the [place] prior to dispensing any controlled drugs.           Patient's diagnosis is consistent with vertigo.  Patient presented to the emergency department complaining of sudden onset of dizziness followed by anxiety and high blood pressure.  Patient has a history of hypertension and states that after the symptoms  began his blood pressure was elevated.  Exam was overall reassuring.  CT reveals no acute findings.  Patient's symptoms, physical exam are consistent with vertigo and patient will be treated with meclizine.  Dose of Norvasc in the emergency department for patient's hypertension.  Follow-up with primary care.  Patient also has a history of hypothyroidism and TSH, T3 and T4 will be checked but will not return tonight and he may follow-up these labs with his primary care.. Patient is given ED precautions to return to the ED for any worsening or new symptoms.     ____________________________________________  FINAL CLINICAL IMPRESSION(S) / ED DIAGNOSES  Final diagnoses:  Vertigo  NEW MEDICATIONS STARTED DURING THIS VISIT:  ED Discharge Orders         Ordered    meclizine (ANTIVERT) 25 MG tablet  3 times daily PRN     12/23/19 2000              This chart was dictated using voice recognition software/Dragon. Despite best efforts to proofread, errors can occur which can change the meaning. Any change was purely unintentional.    Brynda Peon 12/23/19 2039    Carrie Mew, MD 12/23/19 2115

## 2019-12-23 NOTE — ED Triage Notes (Signed)
Pt states intermittent dizziness. States sometimes he gets dizzy and feels off balance and has to hold on to things but then it goes away. States came back today. A&O, ambulatory. [id number] diastolic at home. Takes BP meds at home [facility]).

## 2019-12-25 NOTE — Progress Notes (Signed)
Established patient visit    Patient: Ernest James Ernest James.   DOB: [birth year]   84 y.o. Male  MRN: [id number] Visit Date: 12/28/2019  Today's healthcare provider: Trinna Post, PA-C   Chief Complaint  Patient presents with  . Follow-up    ER  I,Jamelle Goldston M Ernest James,acting as a scribe for Trinna Post, PA-C.,have documented all relevant documentation on the behalf of Trinna Post, PA-C,as directed by  Trinna Post, PA-C while in the presence of Trinna Post, PA-C.  Subjective    HPI Follow up ER visit  Patient was seen in ER for dizziness/ vertigo on 12/23/2019. He was treated for vertigo. Treatment for this included labs, EKG and CT scan( no sign of stroke). He reports fair compliance with treatment. He reports this condition is Unchanged. Patient reports he is still having dizziness and they prescribed him Meclizine 25 MG, but he stopped it after the 2nd day due to making him feel more dizzy. His EKG was normal.   He has a history of HTN, BPH on flomax. He was recently doubled on flomax at night to help with nocturia but this did not provide relief. He has since switched back to Flomax 0.4 mg. He reports difficulty sleeping. He wakes up frequently, often panicking and feeling like he cannot breathe. He dozes frequently through out the day and reports daytime fatigue. He continues with urinary frequency at night.   Patient think he may had a panic attack , but didn't know due to never having a panic attack before. -----------------------------------------------------------------------------------------    Results of the [place] flowsheet 12/28/2019  Sitting and reading 2  Watching TV 2  Sitting, inactive in a public place (e.g. a theatre or a meeting) 1  As a passenger in a car for an hour without a break 2  Lying down to rest in the afternoon when circumstances permit 3  Sitting and talking to someone 0  Sitting quietly after a lunch without alcohol 3   In a car, while stopped for a few minutes in traffic 0  Total score 13       Medications: Outpatient Medications Prior to Visit  Medication Sig  . aspirin EC 81 MG tablet Take 81 mg by mouth daily.  Marland Kitchen atorvastatin (LIPITOR) 40 MG tablet Take 1 tablet by mouth once daily  . Cholecalciferol (VITAMIN D3) 20 MCG TABS Take 1 tablet by mouth daily.   . Cyanocobalamin (VITAMIN B12) 1000 MCG TBCR Take 1 tablet by mouth daily.   Arna Medici 75 MCG tablet TAKE 1 TABLET BY MOUTH ONCE DAILY BEFORE  BREAKFAST  . glucose blood (ONETOUCH VERIO) test strip Test TID. Use as instructed  . Lancets (ONETOUCH ULTRASOFT) lancets Test TID as instructed  . losartan (COZAAR) 50 MG tablet TAKE 1 TABLET BY MOUTH ONCE DAILY  . metFORMIN (GLUCOPHAGE-XR) 500 MG 24 hr tablet Take 1 tablet by mouth once daily with breakfast  . Multiple Vitamins-Minerals (MULTIVITAMIN ADULT PO) Take 1 tablet by mouth daily.  Marland Kitchen nystatin cream (MYCOSTATIN) Apply 1 application topically 2 (two) times daily.  Marland Kitchen omeprazole (PRILOSEC) 20 MG capsule Take 20 mg by mouth daily. PRN  . tamsulosin (FLOMAX) 0.4 MG CAPS capsule Take 1 capsule (0.4 mg total) by mouth 2 (two) times daily.  Marland Kitchen triamcinolone cream (KENALOG) 0.1 % Apply 1 application topically 2 (two) times daily.  Marland Kitchen triamterene-hydrochlorothiazide (MAXZIDE) 75-50 MG tablet Take 1 tablet by mouth once daily  . meclizine (ANTIVERT) 25 MG  tablet Take 1 tablet (25 mg total) by mouth 3 (three) times daily as needed for dizziness. (Patient not taking: Reported on 12/28/2019)   No facility-administered medications prior to visit.    Review of Systems  Constitutional: Negative.   Respiratory: Negative.  Negative for chest tightness.   Cardiovascular: Negative.  Negative for chest pain.  Neurological: Positive for dizziness and light-headedness. Negative for weakness and headaches.  Hematological: Negative.   Psychiatric/Behavioral: Positive for sleep disturbance. Negative for suicidal  ideas.       Objective    BP (!) 152/92 (BP Location: Right Arm, Patient Position: Sitting, Cuff Size: Normal)   Pulse 76   Temp (!) 97 F (36.1 C) (Temporal)   Wt 197 lb 6.4 oz (89.5 kg)   SpO2 96%   BMI 28.32 kg/m    Physical Exam Constitutional:      Appearance: Normal appearance.  Cardiovascular:     Rate and Rhythm: Normal rate and regular rhythm.     Pulses: Normal pulses.     Heart sounds: Normal heart sounds.  Pulmonary:     Effort: Pulmonary effort is normal.     Breath sounds: Normal breath sounds.  Skin:    General: Skin is warm and dry.  Neurological:     General: No focal deficit present.     Mental Status: He is alert and oriented to person, place, and time.  Psychiatric:        Mood and Affect: Mood normal.        Behavior: Behavior normal.       No results found for any visits on 12/28/19.   Assessment & Plan    1. Benign essential HTN Blood pressure is elevated during visit today,12/28/2019, but was controlled at previous visits. We will continue current medications. Advised to pursue sleep study as I suspect he has sleep apnea which is contributing to his symptoms an uncontrolled BP. See #3. Follow up with Dr. Caryn Section in one month.   2. Dizziness Patient was seen in ER for dizziness and was dx w/ vertigo. He was prescribed Meclizine 25 MG tabs,but stopped taking them due to feeling more dizzy on the second day. He can discontinue this.   3. Suspected sleep apnea Patient's wife reports snoring during the night while sleeping. Discussed having a sleep study done, but patient declined for now.   Return in about 1 month (around 01/27/2020) for with PCP.      ITrinna Post, PA-C, have reviewed all documentation for this visit. The documentation on 12/28/19 for the exam, diagnosis, procedures, and orders are all accurate and complete.    Ernest James  [facility] [phone] (phone) [phone] (fax)  [place]

## 2019-12-26 ENCOUNTER — Telehealth: Payer: Self-pay | Admitting: Family Medicine

## 2019-12-26 LAB — T3: T3, Total: 105 ng/dL (ref 71–180)

## 2019-12-26 NOTE — Telephone Encounter (Signed)
Levothyroxine Sodium 75 MCG       Patient requesting call back from Dr. Caryn Section or CMA to discuss potentially changing the dose of this medication as he is still experiencing dizziness from recent ED visit.

## 2019-12-27 NOTE — Telephone Encounter (Addendum)
Patient has an appointment to see Fabio Bering for ER follow up and thyroid f/u tomorrow 12/28/2019 at 11:20am.

## 2019-12-28 ENCOUNTER — Encounter: Payer: Self-pay | Admitting: Physician Assistant

## 2019-12-28 ENCOUNTER — Ambulatory Visit (INDEPENDENT_AMBULATORY_CARE_PROVIDER_SITE_OTHER): Payer: Medicare Other | Admitting: Physician Assistant

## 2019-12-28 ENCOUNTER — Other Ambulatory Visit: Payer: Self-pay

## 2019-12-28 VITALS — BP 152/92 | HR 76 | Temp 97.0°F | Wt 197.4 lb

## 2019-12-28 DIAGNOSIS — R42 Dizziness and giddiness: Secondary | ICD-10-CM | POA: Diagnosis not present

## 2019-12-28 DIAGNOSIS — R29818 Other symptoms and signs involving the nervous system: Secondary | ICD-10-CM

## 2019-12-28 DIAGNOSIS — I10 Essential (primary) hypertension: Secondary | ICD-10-CM

## 2019-12-29 ENCOUNTER — Telehealth: Payer: Self-pay | Admitting: Family Medicine

## 2019-12-29 DIAGNOSIS — G4710 Hypersomnia, unspecified: Secondary | ICD-10-CM

## 2019-12-29 NOTE — Telephone Encounter (Signed)
Patient is calling to let Dr. Caryn Section know that he would like to proceed with the sleep test. And would like to know if a referral can be placed? CB- [phone]

## 2020-01-11 ENCOUNTER — Encounter (INDEPENDENT_AMBULATORY_CARE_PROVIDER_SITE_OTHER): Payer: Self-pay | Admitting: Vascular Surgery

## 2020-01-11 ENCOUNTER — Ambulatory Visit (INDEPENDENT_AMBULATORY_CARE_PROVIDER_SITE_OTHER): Payer: Medicare Other | Admitting: Vascular Surgery

## 2020-01-11 ENCOUNTER — Ambulatory Visit (INDEPENDENT_AMBULATORY_CARE_PROVIDER_SITE_OTHER): Payer: Medicare Other

## 2020-01-11 ENCOUNTER — Other Ambulatory Visit: Payer: Self-pay

## 2020-01-11 VITALS — BP 166/84 | HR 86 | Ht 70.0 in | Wt 197.0 lb

## 2020-01-11 DIAGNOSIS — I6523 Occlusion and stenosis of bilateral carotid arteries: Secondary | ICD-10-CM | POA: Diagnosis not present

## 2020-01-11 DIAGNOSIS — E781 Pure hyperglyceridemia: Secondary | ICD-10-CM

## 2020-01-11 DIAGNOSIS — I10 Essential (primary) hypertension: Secondary | ICD-10-CM | POA: Diagnosis not present

## 2020-01-11 DIAGNOSIS — K2100 Gastro-esophageal reflux disease with esophagitis, without bleeding: Secondary | ICD-10-CM

## 2020-01-16 DIAGNOSIS — R0602 Shortness of breath: Secondary | ICD-10-CM | POA: Diagnosis not present

## 2020-01-16 DIAGNOSIS — G4733 Obstructive sleep apnea (adult) (pediatric): Secondary | ICD-10-CM | POA: Diagnosis not present

## 2020-01-16 LAB — PULMONARY FUNCTION TEST

## 2020-01-17 DIAGNOSIS — G4733 Obstructive sleep apnea (adult) (pediatric): Secondary | ICD-10-CM | POA: Diagnosis not present

## 2020-01-17 DIAGNOSIS — R0602 Shortness of breath: Secondary | ICD-10-CM | POA: Diagnosis not present

## 2020-01-24 ENCOUNTER — Telehealth: Payer: Self-pay | Admitting: Family Medicine

## 2020-01-24 ENCOUNTER — Encounter: Payer: Self-pay | Admitting: Family Medicine

## 2020-01-24 DIAGNOSIS — G4733 Obstructive sleep apnea (adult) (pediatric): Secondary | ICD-10-CM | POA: Insufficient documentation

## 2020-01-24 NOTE — Telephone Encounter (Signed)
Please advise sleep study shows mild sleep apnea. A CPAP would probably be helpful. If he would to try it will send in order. He will need to schedule a follow up 4 weeks after starting CPAP.

## 2020-01-24 NOTE — Telephone Encounter (Signed)
Patient called and given the results as noted below by Dr. Caryn Section. He says he will like to use the CPAP. He says he has an appointment on Monday and will schedule the follow up 4 weeks from then.

## 2020-01-24 NOTE — Telephone Encounter (Signed)
Attempted to contact patient, no answer left a voicemail. Okay for PEC to advise patient.  

## 2020-01-26 NOTE — Progress Notes (Signed)
I,Roshena L Chambers,acting as a scribe for Lelon Huh, MD.,have documented all relevant documentation on the behalf of Lelon Huh, MD,as directed by  Lelon Huh, MD while in the presence of Lelon Huh, MD.  Established patient visit   Patient: Ernest James.   DOB: 1936-03-11   84 y.o. Male  MRN: [id number] Visit Date: 01/29/2020  Today's healthcare provider: Lelon Huh, MD   Chief Complaint  Patient presents with  . Hypertension  . Diabetes   Subjective    HPI Hypertension, follow-up  BP Readings from Last 3 Encounters:  01/29/20 138/60  01/11/20 (!) 166/84  12/28/19 (!) 152/92   Wt Readings from Last 3 Encounters:  01/29/20 197 lb (89.4 kg)  01/11/20 197 lb (89.4 kg)  12/28/19 197 lb 6.4 oz (89.5 kg)     He was last seen for hypertension 1 months ago (seen by Carles Collet, PA-C).  BP at that visit was 152/92. Management since that visit includes patient advised to pursue sleep study. Suspected sleep apnea contributing to uncontrolled BP. Sleep study showed mild sleep apnea and CPAP has been orderd.  He was also seen in ER prior to that visit with very high blood pressure and dizziness. ER evaluation was unremarkable and he also reports that he has since seen vascular surgery with unremarkable evaluation. Has no had any dizziness since then.   Outside blood pressures are not checked. Symptoms: No chest pain No chest pressure  No palpitations No syncope  No dyspnea No orthopnea  No paroxysmal nocturnal dyspnea No lower extremity edema   Pertinent labs: Lab Results  Component Value Date   CHOL 104 05/30/2019   HDL 41 05/30/2019   LDLCALC 38 05/30/2019   TRIG 149 05/30/2019   CHOLHDL 2.5 05/30/2019   Lab Results  Component Value Date   NA 137 12/23/2019   K 3.5 12/23/2019   CREATININE 0.95 12/23/2019   GFRNONAA >60 12/23/2019   GFRAA >60 12/23/2019   GLUCOSE 108 (H) 12/23/2019     The ASCVD Risk score (Goff DC Jr., et al.,  2013) failed to calculate for the following reasons:   The 2013 ASCVD risk score is only valid for ages 19 to 57   --------------------------------------------------------------------------------------------------- He does states he has had a couple of episodes of discomfort of the right mid back that felt like a deep itching, and slight stinging, but it has completely resolved now.      Medications: Outpatient Medications Prior to Visit  Medication Sig  . aspirin EC 81 MG tablet Take 81 mg by mouth daily.  Marland Kitchen atorvastatin (LIPITOR) 40 MG tablet Take 1 tablet by mouth once daily  . Cholecalciferol (VITAMIN D3) 20 MCG TABS Take 1 tablet by mouth daily.   . Cyanocobalamin (VITAMIN B12) 1000 MCG TBCR Take 1 tablet by mouth daily.   Arna Medici 75 MCG tablet TAKE 1 TABLET BY MOUTH ONCE DAILY BEFORE  BREAKFAST  . glucose blood (ONETOUCH VERIO) test strip Test TID. Use as instructed  . Lancets (ONETOUCH ULTRASOFT) lancets Test TID as instructed  . losartan (COZAAR) 50 MG tablet TAKE 1 TABLET BY MOUTH ONCE DAILY  . metFORMIN (GLUCOPHAGE-XR) 500 MG 24 hr tablet Take 1 tablet by mouth once daily with breakfast  . Multiple Vitamins-Minerals (MULTIVITAMIN ADULT PO) Take 1 tablet by mouth daily.  Marland Kitchen nystatin cream (MYCOSTATIN) Apply 1 application topically 2 (two) times daily.  Marland Kitchen omeprazole (PRILOSEC) 20 MG capsule Take 20 mg by mouth daily. PRN  .  tamsulosin (FLOMAX) 0.4 MG CAPS capsule Take 1 capsule (0.4 mg total) by mouth 2 (two) times daily.  Marland Kitchen triamcinolone cream (KENALOG) 0.1 % Apply 1 application topically 2 (two) times daily.  Marland Kitchen triamterene-hydrochlorothiazide (MAXZIDE) 75-50 MG tablet Take 1 tablet by mouth once daily  . meclizine (ANTIVERT) 25 MG tablet Take 1 tablet (25 mg total) by mouth 3 (three) times daily as needed for dizziness. (Patient not taking: Reported on 01/29/2020)   No facility-administered medications prior to visit.    Review of Systems  Constitutional: Negative for  appetite change, chills and fever.  Respiratory: Negative for chest tightness, shortness of breath and wheezing.   Cardiovascular: Negative for chest pain and palpitations.  Gastrointestinal: Negative for abdominal pain, nausea and vomiting.  Endocrine:       Nocturia      Objective    BP 138/60 (BP Location: Left Arm, Patient Position: Sitting, Cuff Size: Large)   Pulse 73   Temp (!) 97.1 F (36.2 C) (Temporal)   Resp 16   Wt 197 lb (89.4 kg)   SpO2 95% Comment: room air  BMI 28.27 kg/m    Physical Exam   General: Appearance:     Overweight male in no acute distress  Eyes:    PERRL, conjunctiva/corneas clear, EOM's intact       Lungs:     Clear to auscultation bilaterally, respirations unlabored  Heart:    Normal heart rate. Normal rhythm. No murmurs, rubs, or gallops.   MS:   All extremities are intact. Left para thoracic  Neurologic:   Awake, alert, oriented x 3. No apparent focal neurological           defect.         Results for orders placed or performed in visit on 01/29/20  POCT HgB A1C  Result Value Ref Range   Hemoglobin A1C 6.4 (A) 4.0 - 5.6 %   Est. average glucose Bld gHb Est-mCnc 137     Assessment & Plan     1. Benign essential HTN Much better today. Suspect secondary to episode of vertigo for which he was seen in ER last month. Continue current medications.    2. Type 2 diabetes mellitus without complication, without long-term current use of insulin ([place]) Very well controlled. Continue current medications.     Return in about 6 months (around 07/31/2020).      The entirety of the information documented in the History of Present Illness, Review of Systems and Physical Exam were personally obtained by me. Portions of this information were initially documented by the CMA and reviewed by me for thoroughness and accuracy.      Lelon Huh, MD  [facility] [phone] (phone) [phone] (fax)  [place]

## 2020-01-29 ENCOUNTER — Other Ambulatory Visit: Payer: Self-pay

## 2020-01-29 ENCOUNTER — Encounter: Payer: Self-pay | Admitting: Family Medicine

## 2020-01-29 ENCOUNTER — Ambulatory Visit (INDEPENDENT_AMBULATORY_CARE_PROVIDER_SITE_OTHER): Payer: Medicare Other | Admitting: Family Medicine

## 2020-01-29 VITALS — BP 138/60 | HR 73 | Temp 97.1°F | Resp 16 | Wt 197.0 lb

## 2020-01-29 DIAGNOSIS — E119 Type 2 diabetes mellitus without complications: Secondary | ICD-10-CM | POA: Diagnosis not present

## 2020-01-29 DIAGNOSIS — I10 Essential (primary) hypertension: Secondary | ICD-10-CM

## 2020-01-29 DIAGNOSIS — I6523 Occlusion and stenosis of bilateral carotid arteries: Secondary | ICD-10-CM | POA: Diagnosis not present

## 2020-01-29 LAB — POCT GLYCOSYLATED HEMOGLOBIN (HGB A1C)
Est. average glucose Bld gHb Est-mCnc: 137
Hemoglobin A1C: 6.4 % — AB (ref 4.0–5.6)

## 2020-01-29 NOTE — Patient Instructions (Signed)
.   Please review the attached list of medications and notify my office if there are any errors.   . Please bring all of your medications to every appointment so we can make sure that our medication list is the same as yours.   

## 2020-01-31 ENCOUNTER — Encounter (INDEPENDENT_AMBULATORY_CARE_PROVIDER_SITE_OTHER): Payer: Self-pay | Admitting: Vascular Surgery

## 2020-01-31 NOTE — Progress Notes (Signed)
MRN : [id number]  [facility] Ernest Ulch. is a 84 y.o. (06-26-1936) male who presents with chief complaint of  Chief Complaint  Patient presents with  . Follow-up    U/S follow up  .  History of Present Illness:   The patient is seen for follow up evaluation of carotid stenosis. The carotid stenosis followed by ultrasound.   The patient denies amaurosis fugax. There is no recent history of TIA symptoms or focal motor deficits. There is no prior documented CVA.  The patient is taking enteric-coated aspirin 81 mg daily.  There is no history of migraine headaches. There is no history of seizures.  The patient has a history of coronary artery disease, no recent episodes of angina or shortness of breath. The patient denies PAD or claudication symptoms. There is a history of hyperlipidemia which is being treated with a statin.  Carotid duplex today shows 40-59% RICA and A999333 LICA no change compared to last study   Current Meds  Medication Sig  . aspirin EC 81 MG tablet Take 81 mg by mouth daily.  Marland Kitchen atorvastatin (LIPITOR) 40 MG tablet Take 1 tablet by mouth once daily  . Cholecalciferol (VITAMIN D3) 20 MCG TABS Take 1 tablet by mouth daily.   . Cyanocobalamin (VITAMIN B12) 1000 MCG TBCR Take 1 tablet by mouth daily.   Arna Medici 75 MCG tablet TAKE 1 TABLET BY MOUTH ONCE DAILY BEFORE  BREAKFAST  . glucose blood (ONETOUCH VERIO) test strip Test TID. Use as instructed  . Lancets (ONETOUCH ULTRASOFT) lancets Test TID as instructed  . losartan (COZAAR) 50 MG tablet TAKE 1 TABLET BY MOUTH ONCE DAILY  . metFORMIN (GLUCOPHAGE-XR) 500 MG 24 hr tablet Take 1 tablet by mouth once daily with breakfast  . Multiple Vitamins-Minerals (MULTIVITAMIN ADULT PO) Take 1 tablet by mouth daily.  Marland Kitchen nystatin cream (MYCOSTATIN) Apply 1 application topically 2 (two) times daily.  Marland Kitchen omeprazole (PRILOSEC) 20 MG capsule Take 20 mg by mouth daily. PRN  . tamsulosin (FLOMAX) 0.4 MG CAPS capsule  Take 1 capsule (0.4 mg total) by mouth 2 (two) times daily.  Marland Kitchen triamcinolone cream (KENALOG) 0.1 % Apply 1 application topically 2 (two) times daily.  Marland Kitchen triamterene-hydrochlorothiazide (MAXZIDE) 75-50 MG tablet Take 1 tablet by mouth once daily  . [DISCONTINUED] meclizine (ANTIVERT) 25 MG tablet Take 1 tablet (25 mg total) by mouth 3 (three) times daily as needed for dizziness. (Patient not taking: Reported on 01/29/2020)    Past Medical History:  Diagnosis Date  . Diabetes mellitus without complication ([place])   . GERD (gastroesophageal reflux disease)   . Hypertension   . Phimosis   . Thyroid disease     Past Surgical History:  Procedure Laterality Date  . CIRCUMCISION    . COLONOSCOPY WITH PROPOFOL N/A 03/31/2016   Procedure: COLONOSCOPY WITH PROPOFOL;  Surgeon: Lollie Sails, MD;  Location: [facility] ENDOSCOPY;  Service: Endoscopy;  Laterality: N/A;  . SINUS SURGERY  1994   on left side  . TONSILLECTOMY      Social History Social History   Tobacco Use  . Smoking status: Never Smoker  . Smokeless tobacco: Never Used  Substance Use Topics  . Alcohol use: No    Alcohol/week: 0.0 standard drinks  . Drug use: No    Family History Family History  Problem Relation Age of Onset  . Alzheimer's disease Mother   . CAD Father   . Diabetes Sister        type  2    No Known Allergies    REVIEW OF SYSTEMS (Negative unless checked)  Constitutional: [] Weight loss  [] Fever  [] Chills Cardiac: [] Chest pain   [] Chest pressure   [] Palpitations   [] Shortness of breath when laying flat   [] Shortness of breath with exertion. Vascular:  [] Pain in legs with walking   [] Pain in legs at rest  [] History of DVT   [] Phlebitis   [] Swelling in legs   [] Varicose veins   [] Non-healing ulcers Pulmonary:   [] Uses home oxygen   [] Productive cough   [] Hemoptysis   [] Wheeze  [] COPD   [] Asthma Neurologic:  [] Dizziness   [] Seizures   [] History of stroke   [] History of TIA  [] Aphasia   [] Vissual changes    [] Weakness or numbness in arm   [] Weakness or numbness in leg Musculoskeletal:   [] Joint swelling   [] Joint pain   [] Low back pain Hematologic:  [] Easy bruising  [] Easy bleeding   [] Hypercoagulable state   [] Anemic Gastrointestinal:  [] Diarrhea   [] Vomiting  [] Gastroesophageal reflux/heartburn   [] Difficulty swallowing. Genitourinary:  [] Chronic kidney disease   [] Difficult urination  [] Frequent urination   [] Blood in urine Skin:  [] Rashes   [] Ulcers  Psychological:  [] History of anxiety   []  History of major depression.  Physical Examination  Vitals:   01/11/20 1614  BP: (!) 166/84  Pulse: 86  Weight: 197 lb (89.4 kg)  Height: 5\' 10"  (1.778 m)   Body mass index is 28.27 kg/m. Gen: WD/WN, NAD Head: [place]/AT, No temporalis wasting.  Ear/Nose/Throat: Hearing grossly intact, nares w/o erythema or drainage Eyes: PER, EOMI, sclera nonicteric.  Neck: Supple, no large masses.   Pulmonary:  Good air movement, no audible wheezing bilaterally, no use of accessory muscles.  Cardiac: RRR, no JVD Vascular: carotid bruits Vessel Right Left  Radial Palpable Palpable  Brachial Palpable Palpable  Carotid Palpable Palpable  Gastrointestinal: Non-distended. No guarding/no peritoneal signs.  Musculoskeletal: M/S 5/5 throughout.  No deformity or atrophy.  Neurologic: CN 2-12 intact. Symmetrical.  Speech is fluent. Motor exam as listed above. Psychiatric: Judgment intact, Mood & affect appropriate for pt's clinical situation. Dermatologic: No rashes or ulcers noted.  No changes consistent with cellulitis.  CBC Lab Results  Component Value Date   WBC 9.4 12/23/2019   HGB 14.0 12/23/2019   HCT 39.6 12/23/2019   MCV 88.6 12/23/2019   PLT 246 12/23/2019    BMET    Component Value Date/Time   NA 137 12/23/2019 1352   NA 138 05/30/2019 1454   K 3.5 12/23/2019 1352   CL 104 12/23/2019 1352   CO2 24 12/23/2019 1352   GLUCOSE 108 (H) 12/23/2019 1352   BUN 22 12/23/2019 1352   BUN 17 05/30/2019  1454   CREATININE 0.95 12/23/2019 1352   CALCIUM 9.6 12/23/2019 1352   GFRNONAA >60 12/23/2019 1352   GFRAA >60 12/23/2019 1352   CrCl cannot be calculated (Patient's most recent lab result is older than the maximum 21 days allowed.).  COAG No results found for: INR, PROTIME  Radiology VAS US CAROTID  Result Date: 01/11/2020 Carotid Arterial Duplex Study Comparison Study:  01/11/2019 Performing Technologist: Charlane Ferretti RT (R)(VS)  Examination Guidelines: A complete evaluation includes B-mode imaging, spectral Doppler, color Doppler, and power Doppler as needed of all accessible portions of each vessel. Bilateral testing is considered an integral part of a complete examination. Limited examinations for reoccurring indications may be performed as noted.  Right Carotid Findings: +----------+--------+--------+--------+------------------+--------------------+  PSV cm/sEDV cm/sStenosisPlaque DescriptionComments             +----------+--------+--------+--------+------------------+--------------------+ CCA Prox  88      1                                 tortuous             +----------+--------+--------+--------+------------------+--------------------+ CCA Mid   91      12                                intimal thickening   +----------+--------+--------+--------+------------------+--------------------+ CCA Distal110     15              calcific          intimal thickening   +----------+--------+--------+--------+------------------+--------------------+ ICA Prox  130     16                                ICA/CCA ratio = 1.43 +----------+--------+--------+--------+------------------+--------------------+ ICA Mid   101     17                                                     +----------+--------+--------+--------+------------------+--------------------+ ICA Distal59      13                                                      +----------+--------+--------+--------+------------------+--------------------+ ECA       143     2               calcific                               +----------+--------+--------+--------+------------------+--------------------+ +----------+--------+-------+-----------+-------------------+           PSV cm/sEDV cmsDescribe   Arm Pressure (mmHG) +----------+--------+-------+-----------+-------------------+ [id number]             Multiphasic                    +----------+--------+-------+-----------+-------------------+ +---------+--------+--+--------+-+---------+ VertebralPSV cm/s31EDV cm/s2Antegrade +---------+--------+--+--------+-+---------+ Left Carotid Findings: +----------+--------+--------+--------+------------------+--------------------+           PSV cm/sEDV cm/sStenosisPlaque DescriptionComments             +----------+--------+--------+--------+------------------+--------------------+ CCA Prox  79      11              calcific          intimal thickening   +----------+--------+--------+--------+------------------+--------------------+ CCA Mid   136     18              calcific                               +----------+--------+--------+--------+------------------+--------------------+ CCA Distal270     44              calcific                               +----------+--------+--------+--------+------------------+--------------------+  ICA Prox  246     39              calcific          ICA/CCA ratio = 1.81 +----------+--------+--------+--------+------------------+--------------------+ ICA Mid   147     25                                                     +----------+--------+--------+--------+------------------+--------------------+ ICA Distal103     26                                                     +----------+--------+--------+--------+------------------+--------------------+ ECA       422     29               calcific                               +----------+--------+--------+--------+------------------+--------------------+ +----------+--------+--------+-----------+-------------------+           PSV cm/sEDV cm/sDescribe   Arm Pressure (mmHG) +----------+--------+--------+-----------+-------------------+ [id number]             Multiphasic                    +----------+--------+--------+-----------+-------------------+ +---------+--------+--+--------+-+---------+ VertebralPSV cm/s43EDV cm/s9Antegrade +---------+--------+--+--------+-+---------+ Summary: Right Carotid: Velocities in the right ICA are consistent with a 40-59%                stenosis. The ECA appears >50% stenosed. Left Carotid: Velocities in the left ICA are consistent with a 60-79% stenosis.               The ECA appears >50% stenosed. Vertebrals:  Bilateral vertebral arteries demonstrate antegrade flow. Subclavians: Normal flow hemodynamics were seen in bilateral subclavian              arteries. *See table(s) above for measurements and observations.  Electronically signed by [name] MD on 01/11/2020 at 5:30:17 PM.    Final     Assessment/Plan 1. Bilateral carotid artery stenosis Recommend:  Given the patient's asymptomatic subcritical stenosis no further invasive testing or surgery at this time.  Carotid duplex today shows 40-59% RICA and A999333 LICA no change compared to last study  Continue antiplatelet therapy as prescribed Continue management of CAD, HTN and Hyperlipidemia Healthy heart diet, encouraged exercise at least 4 times per week Follow up in26months with duplex ultrasound and physical exam based on >50% stenosis of theLtcarotid artery - VAS US CAROTID; Future  2. Benign essential HTN Continue antihypertensive medications as already ordered, these medications have been reviewed and there are no changes at this time.   3. Gastroesophageal reflux disease with esophagitis,  unspecified whether hemorrhage Continue PPI as already ordered, this medication has been reviewed and there are no changes at this time.  Avoidence of caffeine and alcohol  Moderate elevation of the head of the bed   4. Pure hyperglyceridemia Continue statin as ordered and reviewed, no changes at this time     [name], MD  01/31/2020 8:29 AM

## 2020-02-05 ENCOUNTER — Other Ambulatory Visit: Payer: Self-pay | Admitting: Family Medicine

## 2020-02-13 ENCOUNTER — Other Ambulatory Visit: Payer: Self-pay | Admitting: Family Medicine

## 2020-02-13 DIAGNOSIS — I10 Essential (primary) hypertension: Secondary | ICD-10-CM

## 2020-02-13 NOTE — Telephone Encounter (Signed)
Requested Prescriptions  Pending Prescriptions Disp Refills  . losartan (COZAAR) 50 MG tablet [Pharmacy Med Name: Losartan Potassium 50 MG Oral Tablet] 90 tablet 1    Sig: Take 1 tablet by mouth once daily     Cardiovascular:  Angiotensin Receptor Blockers Passed - 02/13/2020 11:41 AM      Passed - Cr in normal range and within 180 days    Creatinine, Ser  Date Value Ref Range Status  12/23/2019 0.95 0.61 - 1.24 mg/dL Final         Passed - K in normal range and within 180 days    Potassium  Date Value Ref Range Status  12/23/2019 3.5 3.5 - 5.1 mmol/L Final         Passed - Patient is not pregnant      Passed - Last BP in normal range    BP Readings from Last 1 Encounters:  01/29/20 138/60         Passed - Valid encounter within last 6 months    Recent Outpatient Visits          2 weeks ago Benign essential HTN   [facility] Birdie Sons, MD   1 month ago Benign essential HTN   [place], [place], PA-C   6 months ago Yeast dermatitis of penis   [facility], Kelby Aline, FNP   8 months ago Type 2 diabetes mellitus without complication, without long-term current use of insulin [facility])   [facility] Birdie Sons, MD   1 year ago Type 2 diabetes mellitus without complication, without long-term current use of insulin [facility])   [place], Kirstie Peri, MD      Future Appointments            In 5 months [place], Kirstie Peri, MD [facility], [place]

## 2020-02-27 ENCOUNTER — Telehealth: Payer: Self-pay

## 2020-02-27 DIAGNOSIS — G4733 Obstructive sleep apnea (adult) (pediatric): Secondary | ICD-10-CM

## 2020-02-27 NOTE — Telephone Encounter (Signed)
Copied from [place] [id number]. Topic: General - Other >> Feb 27, 2020 11:13 AM Celene Kras wrote: Reason for CRM: Pt called stating that he would prefer not to use the CPAP machine he has received as he is not having much difficulty yet. Pt states that he would like to give it to someone or possibly hold on to it for future use. Pt is requesting a call back for advice. Please advise.

## 2020-02-29 NOTE — Telephone Encounter (Signed)
He just needs to let the respiratory therapist know and they will pick it up.

## 2020-02-29 NOTE — Telephone Encounter (Signed)
Patient advised.

## 2020-03-18 DIAGNOSIS — M2391 Unspecified internal derangement of right knee: Secondary | ICD-10-CM | POA: Diagnosis not present

## 2020-03-19 ENCOUNTER — Other Ambulatory Visit: Payer: Self-pay | Admitting: Physician Assistant

## 2020-03-19 DIAGNOSIS — M2391 Unspecified internal derangement of right knee: Secondary | ICD-10-CM

## 2020-04-03 NOTE — Progress Notes (Signed)
Subjective:   Ernest James. is a 84 y.o. male who presents for Medicare Annual/Subsequent preventive examination.  Review of Systems    N/A  Cardiac Risk Factors include: advanced age (>48men, >40 women);dyslipidemia;male gender;hypertension;diabetes mellitus     Objective:    Today's Vitals   04/04/20 1102  BP: (!) 132/62  Pulse: 65  Temp: (!) 97.3 F (36.3 C)  TempSrc: Temporal  SpO2: 95%  Weight: (!) 203 lb 6.4 oz (92.3 kg)  Height: 5\' 10"  (1.778 m)  PainSc: 0-No pain   Body mass index is 29.18 kg/m.  Advanced Directives 04/04/2020 12/23/2019 04/04/2019 04/12/2018 03/31/2018 06/16/2017 03/31/2016  Does Patient Have a Medical Advance Directive? Yes Yes Yes Yes Yes Yes No  Type of Paramedic of [place];Living will Living will [place];Living will [place];Living will [place];Living will [place];Living will -  Does patient want to make changes to medical advance directive? - - - No - Patient declined - - -  Copy of [place] in Chart? No - copy requested - No - copy requested - No - copy requested - -  Would patient like information on creating a medical advance directive? - - - - - - No - patient declined information    Current Medications (verified) Outpatient Encounter Medications as of 04/04/2020  Medication Sig  . aspirin EC 81 MG tablet Take 81 mg by mouth daily.  Marland Kitchen atorvastatin (LIPITOR) 40 MG tablet Take 1 tablet by mouth once daily  . Cholecalciferol (VITAMIN D3) 20 MCG TABS Take 1 tablet by mouth daily.   . Cyanocobalamin (VITAMIN B12) 1000 MCG TBCR Take 1 tablet by mouth daily.   Arna Medici 75 MCG tablet TAKE 1 TABLET BY MOUTH ONCE DAILY BEFORE  BREAKFAST  . glucose blood (ONETOUCH VERIO) test strip Test TID. Use as instructed  . Lancets (ONETOUCH ULTRASOFT) lancets Test TID as instructed  . losartan (COZAAR) 50 MG tablet Take  1 tablet by mouth once daily  . metFORMIN (GLUCOPHAGE-XR) 500 MG 24 hr tablet Take 1 tablet by mouth once daily with breakfast  . nystatin cream (MYCOSTATIN) Apply 1 application topically 2 (two) times daily.  Marland Kitchen omeprazole (PRILOSEC) 20 MG capsule Take 20 mg by mouth daily. PRN  . tamsulosin (FLOMAX) 0.4 MG CAPS capsule Take 1 capsule (0.4 mg total) by mouth 2 (two) times daily.  Marland Kitchen triamterene-hydrochlorothiazide (MAXZIDE) 75-50 MG tablet Take 1 tablet by mouth once daily  . Multiple Vitamins-Minerals (MULTIVITAMIN ADULT PO) Take 1 tablet by mouth daily. (Patient not taking: Reported on 04/04/2020)  . triamcinolone cream (KENALOG) 0.1 % Apply 1 application topically 2 (two) times daily. (Patient not taking: Reported on 04/04/2020)   No facility-administered encounter medications on file as of 04/04/2020.    Allergies (verified) Patient has no known allergies.   History: Past Medical History:  Diagnosis Date  . Arthritis   . Diabetes mellitus without complication ([place])   . GERD (gastroesophageal reflux disease)   . Hyperlipidemia   . Hypertension   . Phimosis   . Thyroid disease    Past Surgical History:  Procedure Laterality Date  . CIRCUMCISION    . COLONOSCOPY WITH PROPOFOL N/A 03/31/2016   Procedure: COLONOSCOPY WITH PROPOFOL;  Surgeon: Lollie Sails, MD;  Location: [facility] ENDOSCOPY;  Service: Endoscopy;  Laterality: N/A;  . MRI    . SINUS SURGERY  1994   on left side  . TONSILLECTOMY  Family History  Problem Relation Age of Onset  . Alzheimer's disease Mother   . CAD Father   . Diabetes Sister        type 2   Social History   Socioeconomic History  . Marital status: Married    Spouse name: Not on file  . Number of children: 3  . Years of education: Not on file  . Highest education level: Bachelor's degree (e.g., BA, AB, BS)  Occupational History  . Occupation: Architectural technologist    Comment: retired  Tobacco Use  . Smoking status: Never Smoker  . Smokeless tobacco:  Never Used  Vaping Use  . Vaping Use: Never used  Substance and Sexual Activity  . Alcohol use: No    Alcohol/week: 0.0 standard drinks  . Drug use: No  . Sexual activity: Not on file  Other Topics Concern  . Not on file  Social History Narrative  . Not on file   Social Determinants of Health   Financial Resource Strain: Low Risk   . Difficulty of Paying Living Expenses: Not hard at all  Food Insecurity: No Food Insecurity  . Worried About Charity fundraiser in the Last Year: Never true  . Ran Out of Food in the Last Year: Never true  Transportation Needs: No Transportation Needs  . Lack of Transportation (Medical): No  . Lack of Transportation (Non-Medical): No  Physical Activity: Inactive  . Days of Exercise per Week: 0 days  . Minutes of Exercise per Session: 0 min  Stress: No Stress Concern Present  . Feeling of Stress : Only a little  Social Connections: Moderately Integrated  . Frequency of Communication with Friends and Family: More than three times a week  . Frequency of Social Gatherings with Friends and Family: More than three times a week  . Attends Religious Services: More than 4 times per year  . Active Member of Clubs or Organizations: No  . Attends Archivist Meetings: Never  . Marital Status: Married    Tobacco Counseling Counseling given: Not Answered   Clinical Intake:  Pre-visit preparation completed: Yes  Pain : No/denies pain Pain Score: 0-No pain     Nutritional Status: BMI 25 -29 Overweight Nutritional Risks: None Diabetes: Yes  How often do you need to have someone help you when you read instructions, pamphlets, or other written materials from your doctor or pharmacy?: 1 - Never  Diabetic? Yes  Nutrition Risk Assessment:  Has the patient had any N/V/D within the last 2 months?  No  Does the patient have any non-healing wounds?  No  Has the patient had any unintentional weight loss or weight gain?  No   Diabetes:  Is  the patient diabetic?  Yes  If diabetic, was a CBG obtained today?  No  Did the patient bring in their glucometer from home?  No  How often do you monitor your CBG's? Does not check BS at home.   Financial Strains and Diabetes Management:  Are you having any financial strains with the device, your supplies or your medication? No .  Does the patient want to be seen by Chronic Care Management for management of their diabetes?  No  Would the patient like to be referred to a Nutritionist or for Diabetic Management?  No   Diabetic Exams:  Diabetic Eye Exam: Completed 05/23/19, repeat yearly. Diabetic Foot Exam: Overdue, Pt has been advised about the importance in completing this exam. Note made to follow up on  this at next in office apt.   Interpreter Needed?: No  Information entered by :: [facility], LPN   Activities of Daily Living In your present state of health, do you have any difficulty performing the following activities: 04/04/2020  Hearing? N  Vision? N  Difficulty concentrating or making decisions? N  Walking or climbing stairs? N  Dressing or bathing? N  Doing errands, shopping? N  Preparing Food and eating ? N  Using the Toilet? N  In the past six months, have you accidently leaked urine? Y  Comment Occasionally  Do you have problems with loss of bowel control? N  Managing your Medications? N  Managing your Finances? N  Housekeeping or managing your Housekeeping? N  Some recent data might be hidden    Patient Care Team: Birdie Sons, MD as PCP - General (Family Medicine) Samara Deist, DPM as Referring Physician (Podiatry) Schnier, Dolores Lory, MD (Vascular Surgery) Estill Cotta, MD (Ophthalmology) Reche Dixon, PA-C (Orthopedic Surgery) Birder Robson, MD as Referring Physician (Ophthalmology) Jonathon Bellows, MD as Consulting Physician (Gastroenterology)  Indicate any recent Medical Services you may have received from other than [facility] providers in the  past year (date may be approximate).     Assessment:   This is a routine wellness examination for Ernest James.  Hearing/Vision screen No exam data present  Dietary issues and exercise activities discussed: Current Exercise Habits: The patient does not participate in regular exercise at present, Exercise limited by: None identified  Goals    . DIET - EAT MORE FRUITS AND VEGETABLES     Continue current diet plan of increasing fruits and vegetables in daily diet.     . Exercise 3x per week (30 min per time)     Recommend to exercise for 3 days a week for at least 30 minutes at a time.        Depression Screen PHQ 2/9 Scores 04/04/2020 04/04/2019 11/14/2018 04/12/2018 03/31/2018 11/25/2016 11/22/2015  PHQ - 2 Score 0 0 0 0 0 0 0  PHQ- 9 Score - - - - - 0 1    Fall Risk Fall Risk  04/04/2020 04/04/2019 02/16/2019 01/12/2019 11/14/2018  Falls in the past year? 0 0 1 1 1   Comment - - no falls since 09/2018 - -  Number falls in past yr: 0 - 0 0 0  Injury with Fall? 0 - - 0 0    Any stairs in or around the home? Yes  If so, are there any without handrails? No  Home free of loose throw rugs in walkways, pet beds, electrical cords, etc? Yes  Adequate lighting in your home to reduce risk of falls? Yes   ASSISTIVE DEVICES UTILIZED TO PREVENT FALLS:  Life alert? No  Use of a cane, walker or w/c? No  Grab bars in the bathroom? Yes  Shower chair or bench in shower? Yes  Elevated toilet seat or a handicapped toilet? Yes   TIMED UP AND GO:  Was the test performed? Yes .  Length of time to ambulate 10 feet: 10 sec.   Gait steady and fast without use of assistive device  Cognitive Function: Declined today.     6CIT Screen 04/04/2019 03/31/2018  What Year? 0 points 0 points  What month? 0 points 0 points  What time? 0 points 0 points  Count back from 20 0 points 0 points  Months in reverse 0 points 0 points  Repeat phrase 0 points 2 points  Total Score 0  2    Immunizations Immunization  History  Administered Date(s) Administered  . Fluad Quad(high Dose 65+) 05/30/2019  . Influenza Split 07/24/2011  . Influenza, High Dose Seasonal PF 06/27/2015, 05/27/2016, 06/19/2017, 07/05/2018  . Influenza,inj,Quad PF,6+ Mos 07/08/2014  . PFIZER SARS-COV-2 Vaccination 09/23/2019, 10/17/2019  . Pneumococcal Conjugate-13 06/27/2015  . Pneumococcal Polysaccharide-23 06/30/2007  . Td 06/07/2001  . Tdap 07/08/2014  . Zoster 02/16/2012    TDAP status: Up to date Flu Vaccine status: Up to date Pneumococcal vaccine status: Up to date Covid-19 vaccine status: Completed vaccines  Qualifies for Shingles Vaccine? Yes   Zostavax completed Yes   Shingrix Completed?: No.    Education has been provided regarding the importance of this vaccine. Patient has been advised to call insurance company to determine out of pocket expense if they have not yet received this vaccine. Advised may also receive vaccine at local pharmacy or Health Dept. Verbalized acceptance and understanding.  Screening Tests Health Maintenance  Topic Date Due  . FOOT EXAM  Never done  . INFLUENZA VACCINE  04/07/2020  . OPHTHALMOLOGY EXAM  05/22/2020  . HEMOGLOBIN A1C  07/31/2020  . COLONOSCOPY  03/31/2021  . TETANUS/TDAP  07/08/2024  . COVID-19 Vaccine  Completed  . PNA vac Low Risk Adult  Completed    Health Maintenance  Health Maintenance Due  Topic Date Due  . FOOT EXAM  Never done    Colorectal cancer screening: Completed 03/31/16. Repeat every 5 years  Lung Cancer Screening: (Low Dose CT Chest recommended if Age 45-80 years, 30 pack-year currently smoking OR have quit w/in 15years.) does not qualify.   Additional Screening:  Vision Screening: Recommended annual ophthalmology exams for early detection of glaucoma and other disorders of the eye. Is the patient up to date with their annual eye exam?  Yes  Who is the provider or what is the name of the office in which the patient attends annual eye exams? Dr  George [facility] at [facility] If pt is not established with a provider, would they like to be referred to a provider to establish care? No .   Dental Screening: Recommended annual dental exams for proper oral hygiene  Community Resource Referral / Chronic Care Management: CRR required this visit?  No   CCM required this visit?  No      Plan:     I have personally reviewed and noted the following in the patient's chart:   . Medical and social history . Use of alcohol, tobacco or illicit drugs  . Current medications and supplements . Functional ability and status . Nutritional status . Physical activity . Advanced directives . List of other physicians . Hospitalizations, surgeries, and ER visits in previous 12 months . Vitals . Screenings to include cognitive, depression, and falls . Referrals and appointments  In addition, I have reviewed and discussed with patient certain preventive protocols, quality metrics, and best practice recommendations. A written personalized care plan for preventive services as well as general preventive health recommendations were provided to patient.     Ernest James, [place]   [id number]   Nurse Notes: Pt needs a diabetic foot exam at next in office apt.

## 2020-04-04 ENCOUNTER — Other Ambulatory Visit: Payer: Self-pay

## 2020-04-04 ENCOUNTER — Ambulatory Visit (INDEPENDENT_AMBULATORY_CARE_PROVIDER_SITE_OTHER): Payer: Medicare Other

## 2020-04-04 ENCOUNTER — Telehealth: Payer: Self-pay | Admitting: Family Medicine

## 2020-04-04 VITALS — BP 132/62 | HR 65 | Temp 97.3°F | Ht 70.0 in | Wt 203.4 lb

## 2020-04-04 DIAGNOSIS — Z0000 Encounter for general adult medical examination without abnormal findings: Secondary | ICD-10-CM | POA: Diagnosis not present

## 2020-04-04 NOTE — Telephone Encounter (Signed)
[name] brought in a letter from [employer] that I am sending back.   He is not going to keep the Cpap or Bipap machine he received from them. He said he doesn't want to "deal with that as he has too much going on right now".   He hasnt even opened the package that contains the machine.  He wants to return the package but Huey Romans told him that Dr. Caryn Section would have to write a note stating  That the patient didn't want to [place] the machine. Call patient when ready.

## 2020-04-04 NOTE — Patient Instructions (Signed)
Ernest James , Thank you for taking time to come for your Medicare Wellness Visit. I appreciate your ongoing commitment to your health goals. Please review the following plan we discussed and let me know if I can assist you in the future.   Screening recommendations/referrals: Colonoscopy: Up to date, due 03/2021 Recommended yearly ophthalmology/optometry visit for glaucoma screening and checkup Recommended yearly dental visit for hygiene and checkup  Vaccinations: Influenza vaccine: Done 05/30/19 Pneumococcal vaccine: Completed series Tdap vaccine: Up to date, due 07/2024 Shingles vaccine: Shingrix discussed. Please contact your pharmacy for coverage information.     Advanced directives: Please bring a copy of your POA (Power of Attorney) and/or Living Will to your next appointment.   Conditions/risks identified: Recommend to start walking 3 days a week for at least 30 minutes at a time. Also, continue to increase fruits and vegetables in daily diet to two servings of each a day.  Next appointment: 08/05/20 @ 11:00 PM with Dr Caryn Section   Preventive Care 63 Years and Older, Male Preventive care refers to lifestyle choices and visits with your health care provider that can promote health and wellness. What does preventive care include?  A yearly physical exam. This is also called an annual well check.  Dental exams once or twice a year.  Routine eye exams. Ask your health care provider how often you should have your eyes checked.  Personal lifestyle choices, including:  Daily care of your teeth and gums.  Regular physical activity.  Eating a healthy diet.  Avoiding tobacco and drug use.  Limiting alcohol use.  Practicing safe sex.  Taking low doses of aspirin every day.  Taking vitamin and mineral supplements as recommended by your health care provider. What happens during an annual well check? The services and screenings done by your health care provider during your annual  well check will depend on your age, overall health, lifestyle risk factors, and family history of disease. Counseling  Your health care provider may ask you questions about your:  Alcohol use.  Tobacco use.  Drug use.  Emotional well-being.  Home and relationship well-being.  Sexual activity.  Eating habits.  History of falls.  Memory and ability to understand (cognition).  Work and work Statistician. Screening  You may have the following tests or measurements:  Height, weight, and BMI.  Blood pressure.  Lipid and cholesterol levels. These may be checked every 5 years, or more frequently if you are over 31 years old.  Skin check.  Lung cancer screening. You may have this screening every year starting at age 27 if you have a 30-pack-year history of smoking and currently smoke or have quit within the past 15 years.  Fecal occult blood test (FOBT) of the stool. You may have this test every year starting at age 16.  Flexible sigmoidoscopy or colonoscopy. You may have a sigmoidoscopy every 5 years or a colonoscopy every 10 years starting at age 70.  Prostate cancer screening. Recommendations will vary depending on your family history and other risks.  Hepatitis C blood test.  Hepatitis B blood test.  Sexually transmitted disease (STD) testing.  Diabetes screening. This is done by checking your blood sugar (glucose) after you have not eaten for a while (fasting). You may have this done every 1-3 years.  Abdominal aortic aneurysm (AAA) screening. You may need this if you are a current or former smoker.  Osteoporosis. You may be screened starting at age 51 if you are at high risk. Talk  with your health care provider about your test results, treatment options, and if necessary, the need for more tests. Vaccines  Your health care provider may recommend certain vaccines, such as:  Influenza vaccine. This is recommended every year.  Tetanus, diphtheria, and acellular  pertussis (Tdap, Td) vaccine. You may need a Td booster every 10 years.  Zoster vaccine. You may need this after age 18.  Pneumococcal 13-valent conjugate (PCV13) vaccine. One dose is recommended after age 25.  Pneumococcal polysaccharide (PPSV23) vaccine. One dose is recommended after age 89. Talk to your health care provider about which screenings and vaccines you need and how often you need them. This information is not intended to replace advice given to you by your health care provider. Make sure you discuss any questions you have with your health care provider. Document Released: 09/20/2015 Document Revised: 05/13/2016 Document Reviewed: 06/25/2015 Elsevier Interactive Patient Education  2017 [place] Prevention in the Home Falls can cause injuries. They can happen to people of all ages. There are many things you can do to make your home safe and to help prevent falls. What can I do on the outside of my home?  Regularly fix the edges of walkways and driveways and fix any cracks.  Remove anything that might make you trip as you walk through a door, such as a raised step or threshold.  Trim any bushes or trees on the path to your home.  Use bright outdoor lighting.  Clear any walking paths of anything that might make someone trip, such as rocks or tools.  Regularly check to see if handrails are loose or broken. Make sure that both sides of any steps have handrails.  Any raised decks and porches should have guardrails on the edges.  Have any leaves, snow, or ice cleared regularly.  Use sand or salt on walking paths during winter.  Clean up any spills in your garage right away. This includes oil or grease spills. What can I do in the bathroom?  Use night lights.  Install grab bars by the toilet and in the tub and shower. Do not use towel bars as grab bars.  Use non-skid mats or decals in the tub or shower.  If you need to sit down in the shower, use a plastic,  non-slip stool.  Keep the floor dry. Clean up any water that spills on the floor as soon as it happens.  Remove soap buildup in the tub or shower regularly.  Attach bath mats securely with double-sided non-slip rug tape.  Do not have throw rugs and other things on the floor that can make you trip. What can I do in the bedroom?  Use night lights.  Make sure that you have a light by your bed that is easy to reach.  Do not use any sheets or blankets that are too big for your bed. They should not hang down onto the floor.  Have a firm chair that has side arms. You can use this for support while you get dressed.  Do not have throw rugs and other things on the floor that can make you trip. What can I do in the kitchen?  Clean up any spills right away.  Avoid walking on wet floors.  Keep items that you use a lot in easy-to-reach places.  If you need to reach something above you, use a strong step stool that has a grab bar.  Keep electrical cords out of the way.  Do  not use floor polish or wax that makes floors slippery. If you must use wax, use non-skid floor wax.  Do not have throw rugs and other things on the floor that can make you trip. What can I do with my stairs?  Do not leave any items on the stairs.  Make sure that there are handrails on both sides of the stairs and use them. Fix handrails that are broken or loose. Make sure that handrails are as long as the stairways.  Check any carpeting to make sure that it is firmly attached to the stairs. Fix any carpet that is loose or worn.  Avoid having throw rugs at the top or bottom of the stairs. If you do have throw rugs, attach them to the floor with carpet tape.  Make sure that you have a light switch at the top of the stairs and the bottom of the stairs. If you do not have them, ask someone to add them for you. What else can I do to help prevent falls?  Wear shoes that:  Do not have high heels.  Have rubber  bottoms.  Are comfortable and fit you well.  Are closed at the toe. Do not wear sandals.  If you use a stepladder:  Make sure that it is fully opened. Do not climb a closed stepladder.  Make sure that both sides of the stepladder are locked into place.  Ask someone to hold it for you, if possible.  Clearly mark and make sure that you can see:  Any grab bars or handrails.  First and last steps.  Where the edge of each step is.  Use tools that help you move around (mobility aids) if they are needed. These include:  Canes.  Walkers.  Scooters.  Crutches.  Turn on the lights when you go into a dark area. Replace any light bulbs as soon as they burn out.  Set up your furniture so you have a clear path. Avoid moving your furniture around.  If any of your floors are uneven, fix them.  If there are any pets around you, be aware of where they are.  Review your medicines with your doctor. Some medicines can make you feel dizzy. This can increase your chance of falling. Ask your doctor what other things that you can do to help prevent falls. This information is not intended to replace advice given to you by your health care provider. Make sure you discuss any questions you have with your health care provider. Document Released: 06/20/2009 Document Revised: 01/30/2016 Document Reviewed: 09/28/2014 Elsevier Interactive Patient Education  2017 [employer].

## 2020-04-05 ENCOUNTER — Other Ambulatory Visit: Payer: Self-pay

## 2020-04-05 ENCOUNTER — Ambulatory Visit
Admission: RE | Admit: 2020-04-05 | Discharge: 2020-04-05 | Disposition: A | Discharge status: Home / Self Care | Payer: Medicare Other | Source: Ambulatory Visit | Attending: Physician Assistant | Admitting: Physician Assistant

## 2020-04-05 DIAGNOSIS — M25561 Pain in right knee: Secondary | ICD-10-CM | POA: Diagnosis not present

## 2020-04-05 DIAGNOSIS — M2391 Unspecified internal derangement of right knee: Secondary | ICD-10-CM | POA: Diagnosis not present

## 2020-04-09 NOTE — Telephone Encounter (Signed)
Patient calling back to check status. Please contact patient.

## 2020-04-09 NOTE — Telephone Encounter (Signed)
Haven't had a chance to send note yet.

## 2020-04-09 NOTE — Telephone Encounter (Signed)
Left patient a message advising letter is not ready and we will contact him when ready.

## 2020-04-12 ENCOUNTER — Telehealth: Payer: Self-pay

## 2020-04-12 NOTE — Telephone Encounter (Signed)
Copied from [place] [id number]. Topic: General - Other >> Apr 12, 2020  1:15 PM Yvette Rack wrote: Reason for CRM: Pt stated he was told that he does not need the letter. Pt asked that his request for a letter be disregarded.

## 2020-04-12 NOTE — Telephone Encounter (Signed)
Note written and sent to medical records to fax.

## 2020-04-23 DIAGNOSIS — D2262 Melanocytic nevi of left upper limb, including shoulder: Secondary | ICD-10-CM | POA: Diagnosis not present

## 2020-04-23 DIAGNOSIS — D2261 Melanocytic nevi of right upper limb, including shoulder: Secondary | ICD-10-CM | POA: Diagnosis not present

## 2020-04-23 DIAGNOSIS — Z85828 Personal history of other malignant neoplasm of skin: Secondary | ICD-10-CM | POA: Diagnosis not present

## 2020-04-23 DIAGNOSIS — X32XXXA Exposure to sunlight, initial encounter: Secondary | ICD-10-CM | POA: Diagnosis not present

## 2020-04-23 DIAGNOSIS — D225 Melanocytic nevi of trunk: Secondary | ICD-10-CM | POA: Diagnosis not present

## 2020-04-23 DIAGNOSIS — L821 Other seborrheic keratosis: Secondary | ICD-10-CM | POA: Diagnosis not present

## 2020-04-23 DIAGNOSIS — E1142 Type 2 diabetes mellitus with diabetic polyneuropathy: Secondary | ICD-10-CM | POA: Diagnosis not present

## 2020-04-23 DIAGNOSIS — D2271 Melanocytic nevi of right lower limb, including hip: Secondary | ICD-10-CM | POA: Diagnosis not present

## 2020-04-23 DIAGNOSIS — L570 Actinic keratosis: Secondary | ICD-10-CM | POA: Diagnosis not present

## 2020-04-23 DIAGNOSIS — M2391 Unspecified internal derangement of right knee: Secondary | ICD-10-CM | POA: Diagnosis not present

## 2020-05-23 DIAGNOSIS — H2513 Age-related nuclear cataract, bilateral: Secondary | ICD-10-CM | POA: Diagnosis not present

## 2020-05-23 LAB — HM DIABETES EYE EXAM

## 2020-05-30 ENCOUNTER — Other Ambulatory Visit: Payer: Self-pay | Admitting: Family Medicine

## 2020-06-14 DIAGNOSIS — Z23 Encounter for immunization: Secondary | ICD-10-CM | POA: Diagnosis not present

## 2020-06-17 ENCOUNTER — Other Ambulatory Visit: Payer: Self-pay | Admitting: Family Medicine

## 2020-06-17 NOTE — Telephone Encounter (Signed)
Requested Prescriptions  Pending Prescriptions Disp Refills  . EUTHYROX 75 MCG tablet [Pharmacy Med Name: Euthyrox 75 MCG Oral Tablet] 90 tablet 0    Sig: TAKE 1 TABLET BY MOUTH ONCE DAILY BEFORE BREAKFAST     Endocrinology:  Hypothyroid Agents Failed - 06/17/2020  5:31 AM      Failed - TSH needs to be rechecked within 3 months after an abnormal result. Refill until TSH is due.      Passed - TSH in normal range and within 360 days    TSH  Date Value Ref Range Status  12/23/2019 4.310 0.350 - 4.500 uIU/mL Final    Comment:    Performed by a 3rd Generation assay with a functional sensitivity of <=0.01 uIU/mL. Performed at [facility], [address] [place], [place] [address]   05/30/2019 4.080 0.450 - 4.500 uIU/mL Final         Passed - Valid encounter within last 12 months    Recent Outpatient Visits          4 months ago Benign essential HTN   [facility] Birdie Sons, MD   5 months ago Benign essential HTN   [place], [place], [place]   10 months ago Yeast dermatitis of penis   [facility], Kelby Aline, FNP   1 year ago Type 2 diabetes mellitus without complication, without long-term current use of insulin [facility])   [facility] Birdie Sons, MD   1 year ago Type 2 diabetes mellitus without complication, without long-term current use of insulin [facility])   [facility] Birdie Sons, MD      Future Appointments            In 1 month Fisher, Kirstie Peri, MD [facility], [place]

## 2020-06-20 ENCOUNTER — Telehealth: Payer: Self-pay

## 2020-06-20 NOTE — Telephone Encounter (Signed)
Copied from [place] [phone]. Topic: Referral - Request for Referral >> Jun 20, 2020  2:55 PM Hinda Lenis D wrote: Has patient seen PCP for this complaint? yes *If NO, is insurance requiring patient see PCP for this issue before PCP can refer them? Referral for which specialty: Urology  Preferred provider/office: [place] [facility] [phone] Reason for referral: Bladder issues

## 2020-06-22 NOTE — Telephone Encounter (Signed)
I don't know what he wants referral for. Need to know medical reason for referral. Has he been seen for this? If so when?

## 2020-06-24 NOTE — Telephone Encounter (Signed)
Patient schedule a OV to discuss referral. He has not been seen for bladder issues in the past.

## 2020-07-01 NOTE — Progress Notes (Signed)
Established patient visit   Patient: Ernest James.   DOB: 1935/12/20   84 y.o. Male  MRN: [id number] Visit Date: 07/02/2020  Today's healthcare provider: Lelon Huh, MD   Chief Complaint  Patient presents with  . Nocturia   Subjective    HPI  Nocturia: Patient complains of frequent urination at night. He says this has been ongoing for the past year. Patient reports having to get up to urinate every hour at night. He is currently taking Flomax which has provided some relief. With taking Flomax, he only gets up every 2 hours at night to urinate. Patient denies any burning during urination.      Medications: Outpatient Medications Prior to Visit  Medication Sig  . aspirin EC 81 MG tablet Take 81 mg by mouth daily.  Marland Kitchen atorvastatin (LIPITOR) 40 MG tablet Take 1 tablet by mouth once daily  . Cholecalciferol (VITAMIN D3) 20 MCG TABS Take 1 tablet by mouth daily.   . Cyanocobalamin (VITAMIN B12) 1000 MCG TBCR Take 1 tablet by mouth daily.   Arna Medici 75 MCG tablet TAKE 1 TABLET BY MOUTH ONCE DAILY BEFORE BREAKFAST  . glucose blood (ONETOUCH VERIO) test strip Test TID. Use as instructed  . Lancets (ONETOUCH ULTRASOFT) lancets Test TID as instructed  . losartan (COZAAR) 50 MG tablet Take 1 tablet by mouth once daily  . metFORMIN (GLUCOPHAGE-XR) 500 MG 24 hr tablet Take 1 tablet by mouth once daily with breakfast  . Multiple Vitamins-Minerals (MULTIVITAMIN ADULT PO) Take 1 tablet by mouth daily.   Marland Kitchen nystatin cream (MYCOSTATIN) Apply 1 application topically 2 (two) times daily.  Marland Kitchen omeprazole (PRILOSEC) 20 MG capsule Take 20 mg by mouth daily. PRN  . tamsulosin (FLOMAX) 0.4 MG CAPS capsule Take 1 capsule (0.4 mg total) by mouth 2 (two) times daily.  Marland Kitchen triamcinolone cream (KENALOG) 0.1 % Apply 1 application topically 2 (two) times daily.  Marland Kitchen triamterene-hydrochlorothiazide (MAXZIDE) 75-50 MG tablet Take 1 tablet by mouth once daily   No facility-administered  medications prior to visit.    Review of Systems  Constitutional: Negative for appetite change, chills and fever.  Respiratory: Negative for chest tightness, shortness of breath and wheezing.   Cardiovascular: Negative for chest pain and palpitations.  Gastrointestinal: Negative for abdominal pain, nausea and vomiting.  Endocrine: Positive for polyuria (at night).      Objective    BP 129/73 (BP Location: Left Arm, Patient Position: Sitting, Cuff Size: Large)   Pulse 69   Temp 97.9 F (36.6 C) (Oral)   Resp 16   Wt 201 lb (91.2 kg)   BMI 28.84 kg/m    Physical Exam  General appearance:  Overweight male, cooperative and in no acute distress Head: Normocephalic, without obvious abnormality, atraumatic Respiratory: Respirations even and unlabored, normal respiratory rate     Assessment & Plan     1. Nocturia Slight improvement with 0.8mg  tamsulosin every day, but still having to get up several times a night. He would like to see Dr. Glori Luis in [facility] for further evaluation   2. Prostate cancer screening  - PSA Total (Reflex To Free)  3. Need for influenza vaccination  - Flu Vaccine QUAD High Dose IM (Fluad)  4. Hypothyroidism, unspecified type  - TSH  5. Mixed hyperlipidemia  - Lipid panel  6. Pre-diabetes He is tolerating atorvastatin well with no adverse effects.   - Hemoglobin A1c  7. Benign essential HTN Well controlled.  Continue current medications.  The entirety of the information documented in the History of Present Illness, Review of Systems and Physical Exam were personally obtained by me. Portions of this information were initially documented by the CMA and reviewed by me for thoroughness and accuracy.      Lelon Huh, MD  [facility] [phone] (phone) [phone] (fax)  [place]

## 2020-07-02 ENCOUNTER — Encounter: Payer: Self-pay | Admitting: Family Medicine

## 2020-07-02 ENCOUNTER — Other Ambulatory Visit: Payer: Self-pay

## 2020-07-02 ENCOUNTER — Ambulatory Visit (INDEPENDENT_AMBULATORY_CARE_PROVIDER_SITE_OTHER): Payer: Medicare Other | Admitting: Family Medicine

## 2020-07-02 VITALS — BP 129/73 | HR 69 | Temp 97.9°F | Resp 16 | Wt 201.0 lb

## 2020-07-02 DIAGNOSIS — E039 Hypothyroidism, unspecified: Secondary | ICD-10-CM

## 2020-07-02 DIAGNOSIS — Z125 Encounter for screening for malignant neoplasm of prostate: Secondary | ICD-10-CM | POA: Diagnosis not present

## 2020-07-02 DIAGNOSIS — R7303 Prediabetes: Secondary | ICD-10-CM

## 2020-07-02 DIAGNOSIS — I10 Essential (primary) hypertension: Secondary | ICD-10-CM

## 2020-07-02 DIAGNOSIS — Z23 Encounter for immunization: Secondary | ICD-10-CM | POA: Diagnosis not present

## 2020-07-02 DIAGNOSIS — R351 Nocturia: Secondary | ICD-10-CM | POA: Diagnosis not present

## 2020-07-02 DIAGNOSIS — I6523 Occlusion and stenosis of bilateral carotid arteries: Secondary | ICD-10-CM

## 2020-07-02 DIAGNOSIS — E782 Mixed hyperlipidemia: Secondary | ICD-10-CM

## 2020-07-02 NOTE — Patient Instructions (Signed)
.   Please review the attached list of medications and notify my office if there are any errors.   . Please bring all of your medications to every appointment so we can make sure that our medication list is the same as yours.   . Please go to the lab draw station in [address] on the second floor of [facility] . Normal hours are 8:00am to 11:30am and 1:00pm to 4:00pm Monday through Friday

## 2020-07-03 ENCOUNTER — Other Ambulatory Visit: Payer: Self-pay | Admitting: Family Medicine

## 2020-07-03 DIAGNOSIS — R351 Nocturia: Secondary | ICD-10-CM

## 2020-07-03 LAB — LIPID PANEL
Chol/HDL Ratio: 2.9 ratio (ref 0.0–5.0)
Cholesterol, Total: 116 mg/dL (ref 100–199)
HDL: 40 mg/dL (ref >39)
LDL Chol Calc (NIH): 49 mg/dL (ref 0–99)
Triglycerides: 159 mg/dL — ABNORMAL HIGH (ref 0–149)
VLDL Cholesterol Cal: 27 mg/dL (ref 5–40)

## 2020-07-03 LAB — TSH: TSH: 3.52 u[IU]/mL (ref 0.450–4.500)

## 2020-07-03 LAB — PSA TOTAL (REFLEX TO FREE): Prostate Specific Ag, Serum: 1.9 ng/mL (ref 0.0–4.0)

## 2020-07-03 LAB — HEMOGLOBIN A1C
Est. average glucose Bld gHb Est-mCnc: 143 mg/dL
Hgb A1c MFr Bld: 6.6 % — ABNORMAL HIGH (ref 4.8–5.6)

## 2020-07-05 ENCOUNTER — Telehealth: Payer: Self-pay

## 2020-07-05 NOTE — Telephone Encounter (Signed)
-----   Message from Birdie Sons, MD sent at 07/03/2020  7:45 AM EDT ----- PSA is normal. Have put in referral for him to see Dr. Diamantina Providence in [place].  A1c is good at 6.6, cholesterol is good at 116. Continue current medications.   He is scheduled for diabetes follow up November 29th, but he can reschedule for 6 months out since we did all his labs yesterday .

## 2020-07-05 NOTE — Telephone Encounter (Signed)
Tried calling patient. Left message to call back. OK for [facility] triage to advise of results and reschedule appointment for 6 months out.

## 2020-07-08 NOTE — Telephone Encounter (Signed)
Attempted to contact patient. Left VM to return call to office. 

## 2020-07-08 NOTE — Telephone Encounter (Signed)
See result note. Patient provided results and appt scheduled.

## 2020-07-22 ENCOUNTER — Other Ambulatory Visit: Payer: Self-pay

## 2020-07-22 ENCOUNTER — Other Ambulatory Visit: Payer: Self-pay | Admitting: Family Medicine

## 2020-07-22 DIAGNOSIS — N401 Enlarged prostate with lower urinary tract symptoms: Secondary | ICD-10-CM

## 2020-07-23 ENCOUNTER — Other Ambulatory Visit: Payer: Self-pay

## 2020-07-23 ENCOUNTER — Other Ambulatory Visit
Admission: RE | Admit: 2020-07-23 | Discharge: 2020-07-23 | Disposition: A | Discharge status: Home / Self Care | Payer: Medicare Other | Attending: Urology | Admitting: Urology

## 2020-07-23 ENCOUNTER — Ambulatory Visit (INDEPENDENT_AMBULATORY_CARE_PROVIDER_SITE_OTHER): Payer: Medicare Other | Admitting: Urology

## 2020-07-23 ENCOUNTER — Encounter: Payer: Self-pay | Admitting: Urology

## 2020-07-23 VITALS — BP 157/73 | HR 78 | Ht 69.0 in | Wt 201.0 lb

## 2020-07-23 DIAGNOSIS — R351 Nocturia: Secondary | ICD-10-CM

## 2020-07-23 DIAGNOSIS — N401 Enlarged prostate with lower urinary tract symptoms: Secondary | ICD-10-CM

## 2020-07-23 DIAGNOSIS — R339 Retention of urine, unspecified: Secondary | ICD-10-CM

## 2020-07-23 LAB — URINALYSIS, COMPLETE (UACMP) WITH MICROSCOPIC
Bilirubin Urine: NEGATIVE
Glucose, UA: NEGATIVE mg/dL
Hgb urine dipstick: NEGATIVE
Ketones, ur: NEGATIVE mg/dL
Leukocytes,Ua: NEGATIVE
Nitrite: NEGATIVE
Protein, ur: NEGATIVE mg/dL
Specific Gravity, Urine: 1.015 (ref 1.005–1.030)
pH: 6.5 (ref 5.0–8.0)

## 2020-07-23 LAB — BLADDER SCAN AMB NON-IMAGING

## 2020-07-23 NOTE — Progress Notes (Signed)
07/23/20 1:46 PM   Ernest James. 1936/08/09 [id number]  CC: BPH, nocturia  HPI: I saw Ernest James in clinic today for urinary symptoms.  He is an 84 year old male who underwent an uncomplicated circumcision at [facility] in 2015 for phimosis and reports multiple years of worsening urinary symptoms.  His primary urinary complaint is urinary frequency, with urination every hour overnight, as well as urinary frequency during the day.  He sits to void secondary to his weak urinary stream.  He denies any significant urgency or urge incontinence.  He drinks primarily water during the day.  He is currently on a double dose of Flomax at night, that he does feel improves his urinary symptoms.  He reports he was diagnosed with mild sleep apnea, but never started using a CPAP machine.  He denies any gross hematuria or recent UTIs.  No prior imaging to review prostate volume.  PSA has been normal, most recently 1.9.  He takes his diuretic in the morning, and denies any lower extremity edema in the evenings.  Urinalysis today is benign with 0-5 RBCs, 0-5 squamous cells, 0-5 WBCs, rare bacteria, nitrite negative, no leukocytes.  PVR is mildly elevated at 200 mL.  IPSS score today is 18, with quality of life mixed.  PMH: Past Medical History:  Diagnosis Date  . Arthritis   . Diabetes mellitus without complication ([place])   . GERD (gastroesophageal reflux disease)   . Hyperlipidemia   . Hypertension   . Phimosis   . Thyroid disease     Surgical History: Past Surgical History:  Procedure Laterality Date  . CIRCUMCISION    . COLONOSCOPY WITH PROPOFOL N/A 03/31/2016   Procedure: COLONOSCOPY WITH PROPOFOL;  Surgeon: Lollie Sails, MD;  Location: [facility] ENDOSCOPY;  Service: Endoscopy;  Laterality: N/A;  . MRI    . SINUS SURGERY  1994   on left side  . TONSILLECTOMY     Family History: Family History  Problem Relation Age of Onset  . Alzheimer's disease Mother   . CAD Father   .  Diabetes Sister        type 2    Social History:  reports that he has never smoked. He has never used smokeless tobacco. He reports that he does not drink alcohol and does not use drugs.  Physical Exam: BP (!) 157/73 (BP Location: Left Arm, Patient Position: Sitting, Cuff Size: Large)   Pulse 78   Ht 5\' 9"  (1.753 m)   Wt 201 lb (91.2 kg)   BMI 29.68 kg/m    Constitutional:  Alert and oriented, No acute distress. Cardiovascular: No clubbing, cyanosis, or edema. Respiratory: Normal respiratory effort, no increased work of breathing. GI: Abdomen is soft, nontender, nondistended, no abdominal masses GU:  phallus without lesions, widely patent meatus DRE: 60 g, smooth, no nodules or masses  Laboratory Data: Reviewed, see HPI  Pertinent Imaging: None to review  Assessment & Plan:   He is a relatively healthy 84 year old male with a long history of urinary symptoms primarily urinary frequency during the day and nocturia with weak stream.  Urinalysis is benign today, but signs of incomplete bladder emptying with an elevated PVR of 200 mL.  We discussed possible etiologies at length including BPH, OAB, atonic bladder, and age-related processes.  I think he would benefit from further evaluation with cystoscopy and TRUS and consideration of an outlet procedure with his weak stream and elevated PVRs, and bothersome symptoms despite double dose of Flomax.  We  also discussed consideration of finasteride, but he would like to evaluate further options with cystoscopy and TRUS.  We reviewed behavioral strategies at length including minimizing fluids 3 to 4 hours before bed and double voiding prior to bed.  Follow-up for cystoscopy and TRUS for consideration of outlet procedures  Nickolas Madrid, MD 07/23/2020  [place] [address], [place], [place] [address] [phone]

## 2020-07-23 NOTE — Patient Instructions (Addendum)
Minimize fluids 3-4 hours before bedtime, and urinate twice before going to sleep  Cystoscopy Cystoscopy is a procedure that is used to help diagnose and sometimes treat conditions that affect the lower urinary tract. The lower urinary tract includes the bladder and the urethra. The urethra is the tube that drains urine from the bladder. Cystoscopy is done using a thin, tube-shaped instrument with a light and camera at the end (cystoscope). The cystoscope may be hard or flexible, depending on the goal of the procedure. The cystoscope is inserted through the urethra, into the bladder. Cystoscopy may be recommended if you have:  Urinary tract infections that keep coming back.  Blood in the urine (hematuria).  An inability to control when you urinate (urinary incontinence) or an overactive bladder.  Unusual cells found in a urine sample.  A blockage in the urethra, such as a urinary stone.  Painful urination.  An abnormality in the bladder found during an intravenous pyelogram (IVP) or CT scan. Cystoscopy may also be done to remove a sample of tissue to be examined under a microscope (biopsy). Tell a health care provider about:  Any allergies you have.  All medicines you are taking, including vitamins, herbs, eye drops, creams, and over-the-counter medicines.  Any problems you or family members have had with anesthetic medicines.  Any blood disorders you have.  Any surgeries you have had.  Any medical conditions you have.  Whether you are pregnant or may be pregnant. What are the risks? Generally, this is a safe procedure. However, problems may occur, including:  Infection.  Bleeding.  Allergic reactions to medicines.  Damage to other structures or organs. What happens before the procedure?  Ask your health care provider about: ? Changing or stopping your regular medicines. This is especially important if you are taking diabetes medicines or blood thinners. ? Taking  medicines such as aspirin and ibuprofen. These medicines can thin your blood. Do not take these medicines unless your health care provider tells you to take them. ? Taking over-the-counter medicines, vitamins, herbs, and supplements.  Follow instructions from your health care provider about eating or drinking restrictions.  Ask your health care provider what steps will be taken to help prevent infection. These may include: ? Washing skin with a germ-killing soap. ? Taking antibiotic medicine.  You may have an exam or testing, such as: ? X-rays of the bladder, urethra, or kidneys. ? Urine tests to check for signs of infection.  Plan to have someone take you home from the hospital or clinic. What happens during the procedure?   You will be given one or more of the following: ? A medicine to help you relax (sedative). ? A medicine to numb the area (local anesthetic).  The area around the opening of your urethra will be cleaned.  The cystoscope will be passed through your urethra into your bladder.  Germ-free (sterile) fluid will flow through the cystoscope to fill your bladder. The fluid will stretch your bladder so that your health care provider can clearly examine your bladder walls.  Your doctor will look at the urethra and bladder. Your doctor may take a biopsy or remove stones.  The cystoscope will be removed, and your bladder will be emptied. The procedure may vary among health care providers and hospitals. What can I expect after the procedure? After the procedure, it is common to have:  Some soreness or pain in your abdomen and urethra.  Urinary symptoms. These include: ? Mild pain or burning  when you urinate. Pain should stop within a few minutes after you urinate. This may last for up to 1 week. ? A small amount of blood in your urine for several days. ? Feeling like you need to urinate but producing only a small amount of urine. Follow these instructions at  home: Medicines  Take over-the-counter and prescription medicines only as told by your health care provider.  If you were prescribed an antibiotic medicine, take it as told by your health care provider. Do not stop taking the antibiotic even if you start to feel better. General instructions  Return to your normal activities as told by your health care provider. Ask your health care provider what activities are safe for you.  Do not drive for 24 hours if you were given a sedative during your procedure.  Watch for any blood in your urine. If the amount of blood in your urine increases, call your health care provider.  Follow instructions from your health care provider about eating or drinking restrictions.  If a tissue sample was removed for testing (biopsy) during your procedure, it is up to you to get your test results. Ask your health care provider, or the department that is doing the test, when your results will be ready.  Drink enough fluid to keep your urine pale yellow.  Keep all follow-up visits as told by your health care provider. This is important. Contact a health care provider if you:  Have pain that gets worse or does not get better with medicine, especially pain when you urinate.  Have trouble urinating.  Have more blood in your urine. Get help right away if you:  Have blood clots in your urine.  Have abdominal pain.  Have a fever or chills.  Are unable to urinate. Summary  Cystoscopy is a procedure that is used to help diagnose and sometimes treat conditions that affect the lower urinary tract.  Cystoscopy is done using a thin, tube-shaped instrument with a light and camera at the end.  After the procedure, it is common to have some soreness or pain in your abdomen and urethra.  Watch for any blood in your urine. If the amount of blood in your urine increases, call your health care provider.  If you were prescribed an antibiotic medicine, take it as told  by your health care provider. Do not stop taking the antibiotic even if you start to feel better. This information is not intended to replace advice given to you by your health care provider. Make sure you discuss any questions you have with your health care provider. Document Revised: 08/16/2018 Document Reviewed: 08/16/2018 Elsevier Patient Education  [place].   Benign Prostatic Hyperplasia  Benign prostatic hyperplasia (BPH) is an enlarged prostate gland that is caused by the normal aging process and not by cancer. The prostate is a walnut-sized gland that is involved in the production of semen. It is located in front of the rectum and below the bladder. The bladder stores urine and the urethra is the tube that carries the urine out of the body. The prostate may get bigger as a man gets older. An enlarged prostate can press on the urethra. This can make it harder to pass urine. The build-up of urine in the bladder can cause infection. Back pressure and infection may progress to bladder damage and kidney (renal) failure. What are the causes? This condition is part of a normal aging process. However, not all men develop problems from this  condition. If the prostate enlarges away from the urethra, urine flow will not be blocked. If it enlarges toward the urethra and compresses it, there will be problems passing urine. What increases the risk? This condition is more likely to develop in men over the age of 21 years. What are the signs or symptoms? Symptoms of this condition include:  Getting up often during the night to urinate.  Needing to urinate frequently during the day.  Difficulty starting urine flow.  Decrease in size and strength of your urine stream.  Leaking (dribbling) after urinating.  Inability to pass urine. This needs immediate treatment.  Inability to completely empty your bladder.  Pain when you pass urine. This is more common if there is also an  infection.  Urinary tract infection (UTI). How is this diagnosed? This condition is diagnosed based on your medical history, a physical exam, and your symptoms. Tests will also be done, such as:  A post-void bladder scan. This measures any amount of urine that may remain in your bladder after you finish urinating.  A digital rectal exam. In a rectal exam, your health care provider checks your prostate by putting a lubricated, gloved finger into your rectum to feel the back of your prostate gland. This exam detects the size of your gland and any abnormal lumps or growths.  An exam of your urine (urinalysis).  A prostate specific antigen (PSA) screening. This is a blood test used to screen for prostate cancer.  An ultrasound. This test uses sound waves to electronically produce a picture of your prostate gland. Your health care provider may refer you to a specialist in kidney and prostate diseases (urologist). How is this treated? Once symptoms begin, your health care provider will monitor your condition (active surveillance or watchful waiting). Treatment for this condition will depend on the severity of your condition. Treatment may include:  Observation and yearly exams. This may be the only treatment needed if your condition and symptoms are mild.  Medicines to relieve your symptoms, including: ? Medicines to shrink the prostate. ? Medicines to relax the muscle of the prostate.  Surgery in severe cases. Surgery may include: ? Prostatectomy. In this procedure, the prostate tissue is removed completely through an open incision or with a laparoscope or robotics. ? Transurethral resection of the prostate (TURP). In this procedure, a tool is inserted through the opening at the tip of the penis (urethra). It is used to cut away tissue of the inner core of the prostate. The pieces are removed through the same opening of the penis. This removes the blockage. ? Transurethral incision (TUIP). In  this procedure, small cuts are made in the prostate. This lessens the prostate's pressure on the urethra. ? Transurethral microwave thermotherapy (TUMT). This procedure uses microwaves to create heat. The heat destroys and removes a small amount of prostate tissue. ? Transurethral needle ablation (TUNA). This procedure uses radio frequencies to destroy and remove a small amount of prostate tissue. ? Interstitial laser coagulation ([place]). This procedure uses a laser to destroy and remove a small amount of prostate tissue. ? Transurethral electrovaporization (TUVP). This procedure uses electrodes to destroy and remove a small amount of prostate tissue. ? Prostatic urethral lift. This procedure inserts an implant to push the lobes of the prostate away from the urethra. Follow these instructions at home:  Take over-the-counter and prescription medicines only as told by your health care provider.  Monitor your symptoms for any changes. Contact your health care provider  with any changes.  Avoid drinking large amounts of liquid before going to bed or out in public.  Avoid or reduce how much caffeine or alcohol you drink.  Give yourself time when you urinate.  Keep all follow-up visits as told by your health care provider. This is important. Contact a health care provider if:  You have unexplained back pain.  Your symptoms do not get better with treatment.  You develop side effects from the medicine you are taking.  Your urine becomes very dark or has a bad smell.  Your lower abdomen becomes distended and you have trouble passing your urine. Get help right away if:  You have a fever or chills.  You suddenly cannot urinate.  You feel lightheaded, or very dizzy, or you faint.  There are large amounts of blood or clots in the urine.  Your urinary problems become hard to manage.  You develop moderate to severe low back or flank pain. The flank is the side of your body between the ribs  and the hip. These symptoms may represent a serious problem that is an emergency. Do not wait to see if the symptoms will go away. Get medical help right away. Call your local emergency services (911 in the [place] Do not drive yourself to the hospital. Summary  Benign prostatic hyperplasia (BPH) is an enlarged prostate that is caused by the normal aging process and not by cancer.  An enlarged prostate can press on the urethra. This can make it hard to pass urine.  This condition is part of a normal aging process and is more likely to develop in men over the age of 56 years.  Get help right away if you suddenly cannot urinate. This information is not intended to replace advice given to you by your health care provider. Make sure you discuss any questions you have with your health care provider. Document Revised: 07/19/2018 Document Reviewed: 09/28/2016 Elsevier Patient Education  2020 [employer].

## 2020-07-25 IMAGING — CT CT HEAD W/O CM
2 series · 15 of 36 positions shown, 18 images · non-contrast
Comparison: Head CT dated 11/20/2015.

CLINICAL DATA: 83-year-old male with sudden onset of dizziness.

EXAM:
CT HEAD WITHOUT CONTRAST
TECHNIQUE: Contiguous axial images were obtained from the base of the skull
through the vertex without intravenous contrast.

[Series 3: head wo · axial · 0.40mm/px · z∈[-79,+41]mm · 12 of 29 slices shown, 15 images]
[im 3/29  brain]
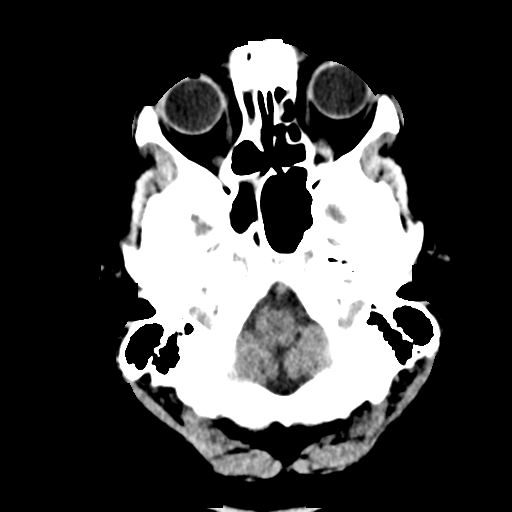
[im 3/29  bone]
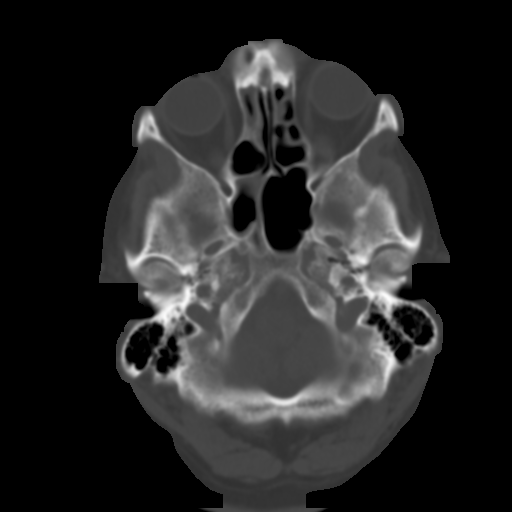
[im 5/29  brain]
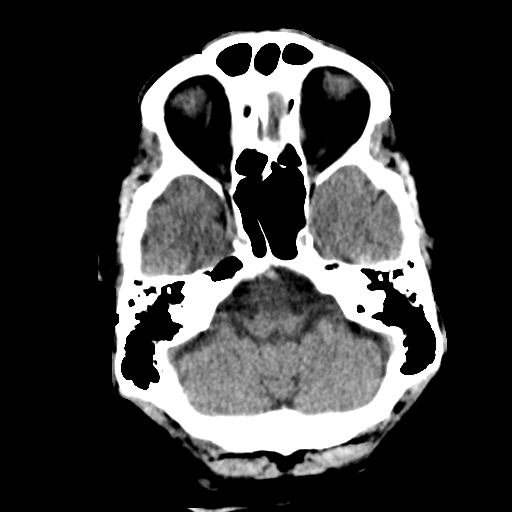
[im 7/29  brain]
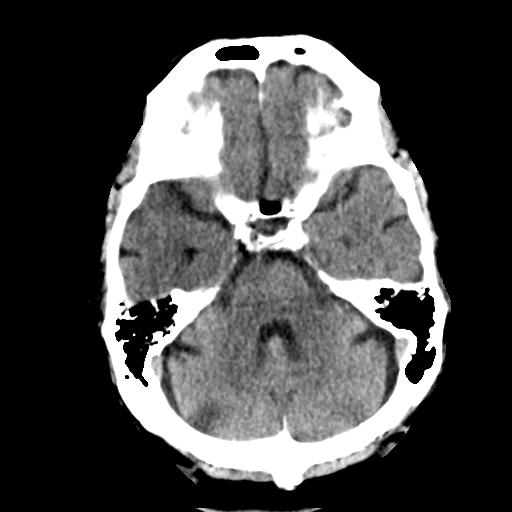
[im 9/29  brain]
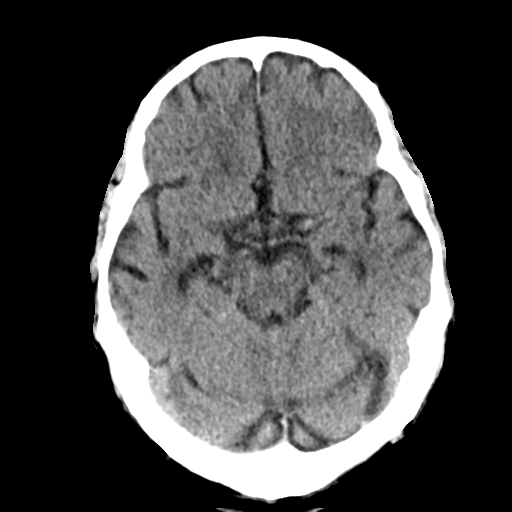
[im 12/29  brain]
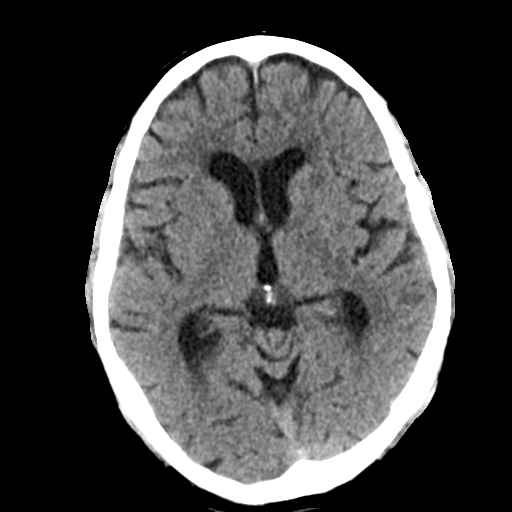
[im 12/29  bone]
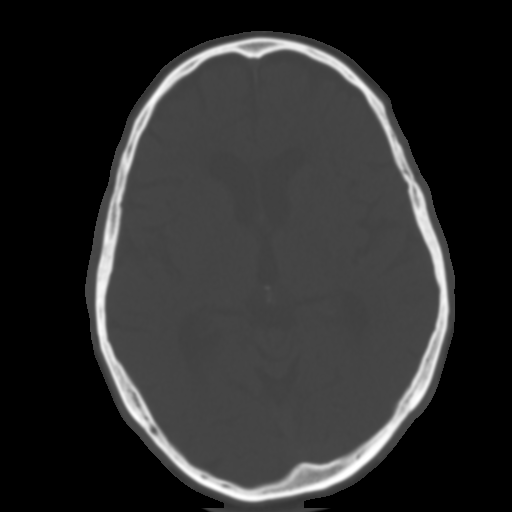
[im 14/29  brain]
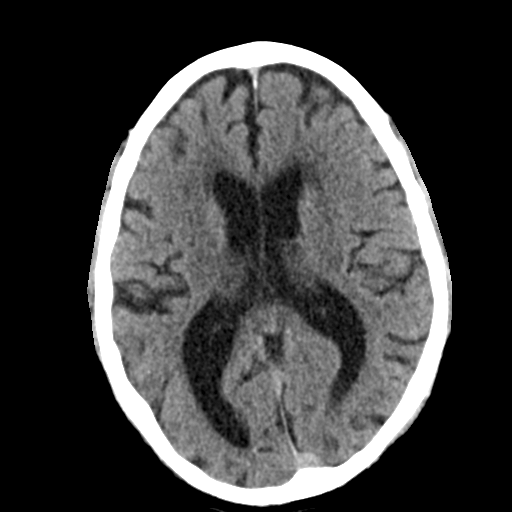
[im 16/29  brain]
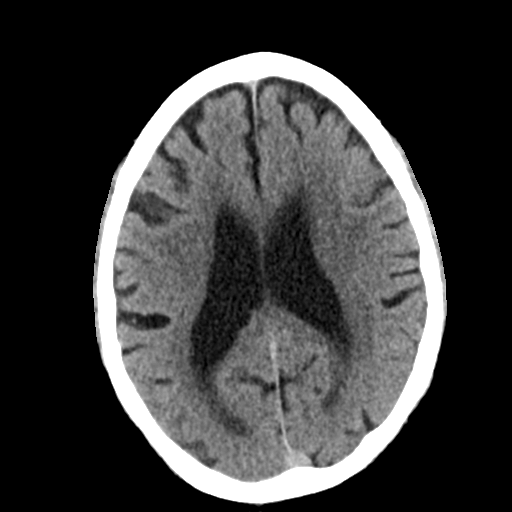
[im 18/29  brain]
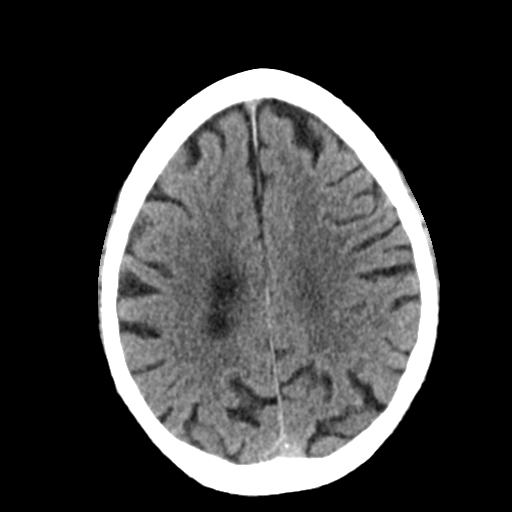
[im 21/29  brain]
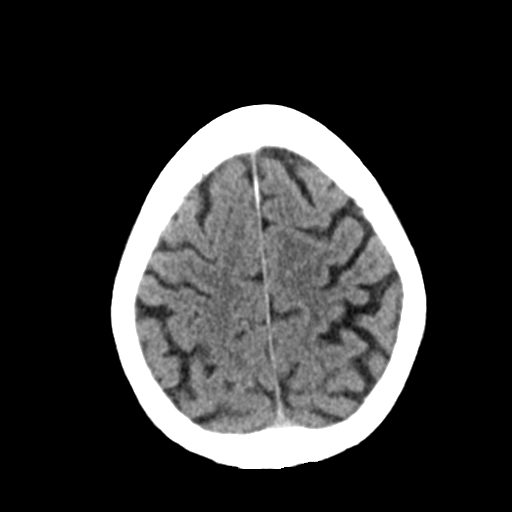
[im 21/29  bone]
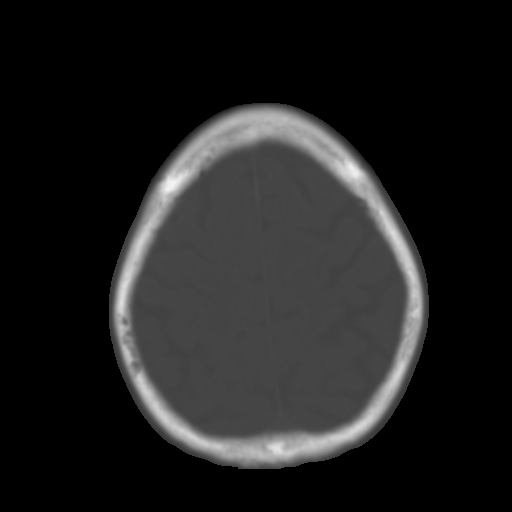
[im 23/29  brain]
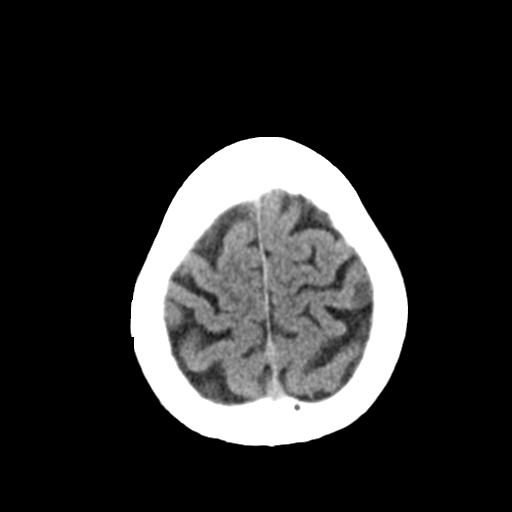
[im 25/29  brain]
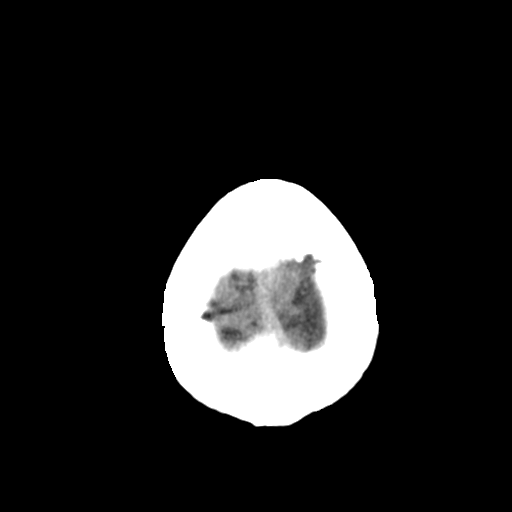
[im 27/29  brain]
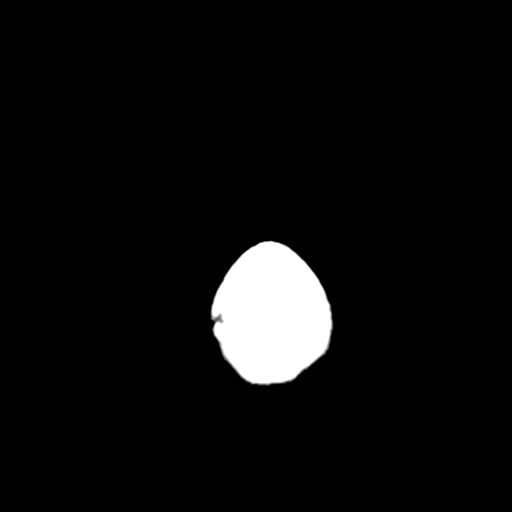

[Series 5: sagittal soft tissue · sagittal · 0.28mm/px · 3 of 53 slices shown]
[im 18/53  brain]
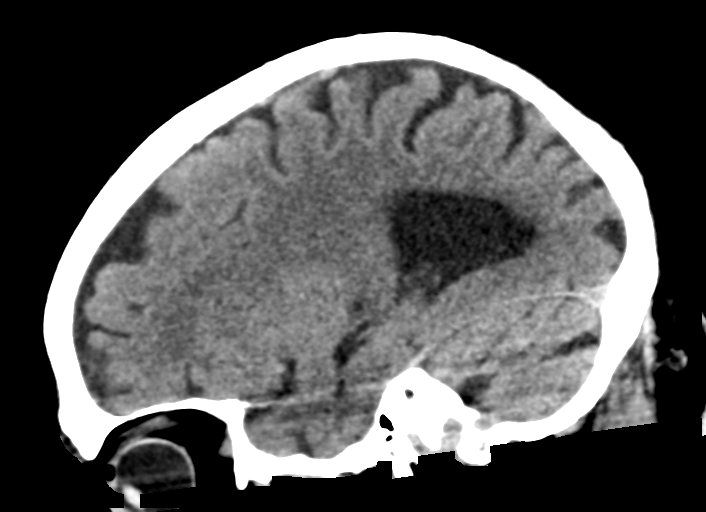
[im 27/53  brain]
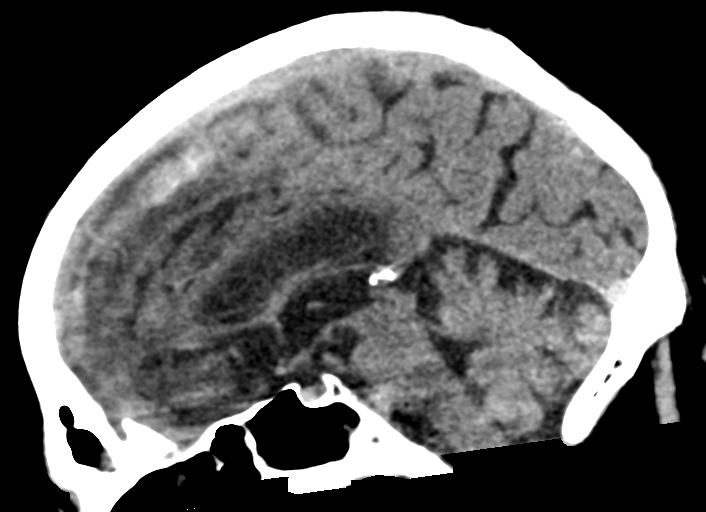
[im 35/53  brain]
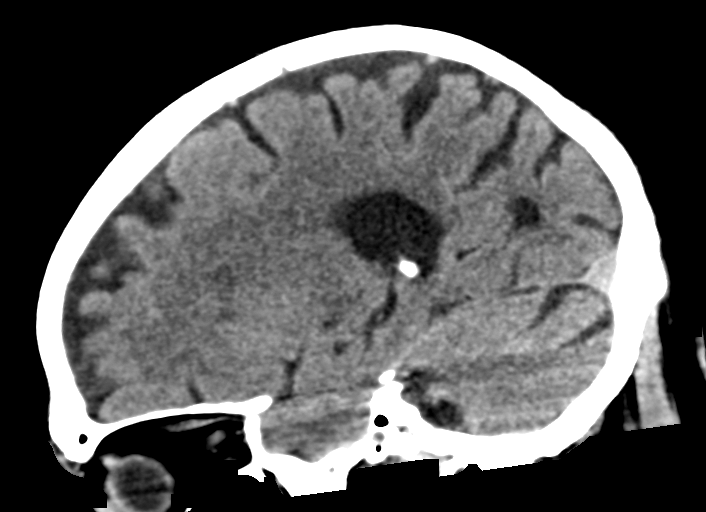

[15 of 36 positions shown; findings below may reference images not displayed]

FINDINGS: Brain: There is mild age-related atrophy and chronic microvascular
ischemic changes. There is no acute intracranial hemorrhage. No mass
effect or midline shift. No extra-axial fluid collection.

Vascular: No hyperdense vessel or unexpected calcification.

Skull: Normal. Negative for fracture or focal lesion.

Sinuses/Orbits: There is diffuse mucoperiosteal thickening of
paranasal sinuses with the mastoid air cells are clear. No air-fluid
level.

Other: None
IMPRESSION: 1. No acute intracranial pathology.
2. Mild age-related atrophy and chronic microvascular ischemic
changes.

## 2020-08-05 ENCOUNTER — Ambulatory Visit: Payer: Self-pay | Admitting: Family Medicine

## 2020-08-14 ENCOUNTER — Encounter: Payer: Medicare Other | Admitting: Urology

## 2020-09-01 ENCOUNTER — Other Ambulatory Visit: Payer: Self-pay | Admitting: Family Medicine

## 2020-09-16 ENCOUNTER — Other Ambulatory Visit: Payer: Self-pay | Admitting: Family Medicine

## 2020-09-16 DIAGNOSIS — I10 Essential (primary) hypertension: Secondary | ICD-10-CM

## 2020-11-01 DIAGNOSIS — L820 Inflamed seborrheic keratosis: Secondary | ICD-10-CM | POA: Diagnosis not present

## 2020-11-01 DIAGNOSIS — L821 Other seborrheic keratosis: Secondary | ICD-10-CM | POA: Diagnosis not present

## 2020-11-01 DIAGNOSIS — L538 Other specified erythematous conditions: Secondary | ICD-10-CM | POA: Diagnosis not present

## 2020-11-01 DIAGNOSIS — L570 Actinic keratosis: Secondary | ICD-10-CM | POA: Diagnosis not present

## 2020-11-01 DIAGNOSIS — L309 Dermatitis, unspecified: Secondary | ICD-10-CM | POA: Diagnosis not present

## 2020-11-01 DIAGNOSIS — X32XXXA Exposure to sunlight, initial encounter: Secondary | ICD-10-CM | POA: Diagnosis not present

## 2020-12-09 ENCOUNTER — Other Ambulatory Visit: Payer: Self-pay | Admitting: Family Medicine

## 2020-12-09 NOTE — Telephone Encounter (Signed)
Requested Prescriptions  Pending Prescriptions Disp Refills  . EUTHYROX 75 MCG tablet [Pharmacy Med Name: Euthyrox 75 MCG Oral Tablet] 90 tablet 0    Sig: TAKE 1 TABLET BY MOUTH ONCE DAILY BEFORE BREAKFAST     Endocrinology:  Hypothyroid Agents Failed - 12/09/2020  5:21 PM      Failed - TSH needs to be rechecked within 3 months after an abnormal result. Refill until TSH is due.      Passed - TSH in normal range and within 360 days    TSH  Date Value Ref Range Status  07/02/2020 3.520 0.450 - 4.500 uIU/mL Final         Passed - Valid encounter within last 12 months    Recent Outpatient Visits          5 months ago Nocturia   [facility] Birdie Sons, MD   10 months ago Benign essential HTN   [facility] Birdie Sons, MD   11 months ago Benign essential HTN   [place], Wendee Beavers, [place]   1 year ago Yeast dermatitis of penis   [facility] Flinchum, Kelby Aline, FNP   1 year ago Type 2 diabetes mellitus without complication, without long-term current use of insulin [facility])   [facility] Birdie Sons, MD      Future Appointments            In 4 weeks Fisher, Kirstie Peri, MD [facility], [place]

## 2020-12-24 DIAGNOSIS — D2261 Melanocytic nevi of right upper limb, including shoulder: Secondary | ICD-10-CM | POA: Diagnosis not present

## 2020-12-24 DIAGNOSIS — X32XXXA Exposure to sunlight, initial encounter: Secondary | ICD-10-CM | POA: Diagnosis not present

## 2020-12-24 DIAGNOSIS — L570 Actinic keratosis: Secondary | ICD-10-CM | POA: Diagnosis not present

## 2020-12-24 DIAGNOSIS — D225 Melanocytic nevi of trunk: Secondary | ICD-10-CM | POA: Diagnosis not present

## 2020-12-24 DIAGNOSIS — Z85828 Personal history of other malignant neoplasm of skin: Secondary | ICD-10-CM | POA: Diagnosis not present

## 2020-12-24 DIAGNOSIS — D2271 Melanocytic nevi of right lower limb, including hip: Secondary | ICD-10-CM | POA: Diagnosis not present

## 2020-12-24 DIAGNOSIS — D2262 Melanocytic nevi of left upper limb, including shoulder: Secondary | ICD-10-CM | POA: Diagnosis not present

## 2021-01-06 NOTE — Progress Notes (Signed)
Established patient visit   Patient: Ernest James.   DOB: 1936-05-05   85 y.o. Male  MRN: [id number] Visit Date: 01/07/2021  Today's healthcare provider: Lelon Huh, MD   Chief Complaint  Patient presents with  . Diabetes   Subjective    HPI  Diabetes Mellitus Type II, Follow-up  Lab Results  Component Value Date   HGBA1C 6.6 (H) 07/02/2020   HGBA1C 6.4 (A) 01/29/2020   HGBA1C 6.3 (H) 05/30/2019   Wt Readings from Last 3 Encounters:  01/07/21 203 lb (92.1 kg)  07/23/20 201 lb (91.2 kg)  07/02/20 201 lb (91.2 kg)   Last seen for diabetes 6 months ago.  Management since then includes continue current medication. He reports good compliance with treatment. He is not having side effects.  Symptoms: No fatigue No foot ulcerations  No appetite changes No nausea  No paresthesia of the feet  No polydipsia  Yes polyuria No visual disturbances   No vomiting     Home blood sugar records: blood sugars are not checked  Episodes of hypoglycemia? No    Current insulin regiment: none Most Recent Eye Exam: 04/2020 Current exercise: none Current diet habits: in general, an "unhealthy" diet  Pertinent Labs: Lab Results  Component Value Date   CHOL 116 07/02/2020   HDL 40 07/02/2020   LDLCALC 49 07/02/2020   TRIG 159 (H) 07/02/2020   CHOLHDL 2.9 07/02/2020   Lab Results  Component Value Date   NA 137 12/23/2019   K 3.5 12/23/2019   CREATININE 0.95 12/23/2019   GFRNONAA >60 12/23/2019   GFRAA >60 12/23/2019   GLUCOSE 108 (H) 12/23/2019     ---------------------------------------------------------------------------------------------------  Hypertension, follow-up  BP Readings from Last 3 Encounters:  01/07/21 136/71  07/23/20 (!) 157/73  07/02/20 129/73   Wt Readings from Last 3 Encounters:  01/07/21 203 lb (92.1 kg)  07/23/20 201 lb (91.2 kg)  07/02/20 201 lb (91.2 kg)     He was last seen for hypertension 6 months ago.  BP at that  visit was 129/73. Management since that visit includes continue same medication.  He reports good compliance with treatment. He is not having side effects.  He is following a Regular diet. He is not exercising. He does not smoke.  Use of agents associated with hypertension: NSAIDS.   Outside blood pressures are not checked. Symptoms: No chest pain No chest pressure  No palpitations No syncope  No dyspnea No orthopnea  No paroxysmal nocturnal dyspnea No lower extremity edema   Pertinent labs: Lab Results  Component Value Date   CHOL 116 07/02/2020   HDL 40 07/02/2020   LDLCALC 49 07/02/2020   TRIG 159 (H) 07/02/2020   CHOLHDL 2.9 07/02/2020   Lab Results  Component Value Date   NA 137 12/23/2019   K 3.5 12/23/2019   CREATININE 0.95 12/23/2019   GFRNONAA >60 12/23/2019   GFRAA >60 12/23/2019   GLUCOSE 108 (H) 12/23/2019     The ASCVD Risk score (Goff DC Jr., et al., 2013) failed to calculate for the following reasons:   The 2013 ASCVD risk score is only valid for ages 67 to 15   ---------------------------------------------------------------------------------------------------  Hypothyroid, follow-up  Lab Results  Component Value Date   TSH 3.520 07/02/2020   TSH 4.310 12/23/2019   TSH 4.080 05/30/2019   FREET4 0.97 12/23/2019   Wt Readings from Last 3 Encounters:  01/07/21 203 lb (92.1 kg)  07/23/20 201 lb (91.2  kg)  07/02/20 201 lb (91.2 kg)    He was last seen for hypothyroid 6 months ago.  Management since that visit includes continue same medication. He reports good compliance with treatment. He is not having side effects.   Symptoms: No change in energy level No constipation  No diarrhea No heat / cold intolerance  No nervousness No palpitations  No weight changes    -----------------------------------------------------------------------------------------      Medications: Outpatient Medications Prior to Visit  Medication Sig  . aspirin EC  81 MG tablet Take 81 mg by mouth daily.  Marland Kitchen atorvastatin (LIPITOR) 40 MG tablet Take 1 tablet by mouth once daily  . Cholecalciferol (VITAMIN D3) 20 MCG TABS Take 1 tablet by mouth daily.   . Cyanocobalamin (VITAMIN B12) 1000 MCG TBCR Take 1 tablet by mouth daily.  Arna Medici 75 MCG tablet TAKE 1 TABLET BY MOUTH ONCE DAILY BEFORE BREAKFAST  . glucose blood (ONETOUCH VERIO) test strip Test TID. Use as instructed  . hydrocortisone 2.5 % cream Apply topically.  Marland Kitchen ketoconazole (NIZORAL) 2 % shampoo Apply 1 application topically 2 (two) times a week.  . Lancets (ONETOUCH ULTRASOFT) lancets Test TID as instructed  . losartan (COZAAR) 50 MG tablet Take 1 tablet by mouth once daily  . metFORMIN (GLUCOPHAGE-XR) 500 MG 24 hr tablet Take 1 tablet by mouth once daily with breakfast  . Multiple Vitamins-Minerals (MULTIVITAMIN ADULT PO) Take 1 tablet by mouth daily.   Marland Kitchen omeprazole (PRILOSEC) 20 MG capsule Take 20 mg by mouth daily. PRN  . tamsulosin (FLOMAX) 0.4 MG CAPS capsule Take 1 capsule (0.4 mg total) by mouth 2 (two) times daily.  Marland Kitchen triamterene-hydrochlorothiazide (MAXZIDE) 75-50 MG tablet Take 1 tablet by mouth daily.   No facility-administered medications prior to visit.    Review of Systems  Constitutional: Negative for appetite change, chills and fever.  Respiratory: Negative for chest tightness, shortness of breath and wheezing.   Cardiovascular: Negative for chest pain and palpitations.  Gastrointestinal: Negative for abdominal pain, nausea and vomiting.  Endocrine: Positive for polyuria.       Objective    BP 136/71 (BP Location: Left Arm, Patient Position: Sitting, Cuff Size: Normal)   Pulse 86   Temp 98.2 F (36.8 C) (Temporal)   Resp 16   Wt 203 lb (92.1 kg)   BMI 29.98 kg/m     Physical Exam  General appearance:  Well developed, well nourished male, cooperative and in no acute distress Head: Normocephalic, without obvious abnormality, atraumatic Respiratory:  Respirations even and unlabored, normal respiratory rate Extremities: All extremities are intact.  Skin: Skin color, texture, turgor normal. No rashes seen  Psych: Appropriate mood and affect. Neurologic: Mental status: Alert, oriented to person, place, and time, thought content appropriate.   Results for orders placed or performed in visit on 01/07/21  POCT glycosylated hemoglobin (Hb A1C)  Result Value Ref Range   Hemoglobin A1C 6.4 (A) 4.0 - 5.6 %   Est. average glucose Bld gHb Est-mCnc 137      Assessment & Plan     1. Diabetes mellitus without complication ([place]) Very well controlled. Continue current medications.    2. Hypothyroidism, unspecified type  - TSH - T4, free  3. Benign essential HTN Well controlled.  Continue current medications.   - Renal function panel  4. OSA (obstructive sleep apnea) He never did try CPAP that was ordered last year since it thought that maintaining it would be to much work.   5.  Nocturia Is seeing Sninski. Advised untreated OSA could contribute to night time awakening and he should reconsider CPAP trial    Follow up CPE end of October.      The entirety of the information documented in the History of Present Illness, Review of Systems and Physical Exam were personally obtained by me. Portions of this information were initially documented by the CMA and reviewed by me for thoroughness and accuracy.      Lelon Huh, MD  [facility] [phone] (phone) [phone] (fax)  [place]

## 2021-01-07 ENCOUNTER — Ambulatory Visit (INDEPENDENT_AMBULATORY_CARE_PROVIDER_SITE_OTHER): Payer: Medicare Other | Admitting: Family Medicine

## 2021-01-07 ENCOUNTER — Encounter: Payer: Self-pay | Admitting: Family Medicine

## 2021-01-07 ENCOUNTER — Other Ambulatory Visit: Payer: Self-pay

## 2021-01-07 ENCOUNTER — Ambulatory Visit: Payer: Medicare Other | Attending: Internal Medicine

## 2021-01-07 VITALS — BP 136/71 | HR 86 | Temp 98.2°F | Resp 16 | Wt 203.0 lb

## 2021-01-07 DIAGNOSIS — G4733 Obstructive sleep apnea (adult) (pediatric): Secondary | ICD-10-CM

## 2021-01-07 DIAGNOSIS — Z23 Encounter for immunization: Secondary | ICD-10-CM

## 2021-01-07 DIAGNOSIS — R351 Nocturia: Secondary | ICD-10-CM

## 2021-01-07 DIAGNOSIS — E039 Hypothyroidism, unspecified: Secondary | ICD-10-CM

## 2021-01-07 DIAGNOSIS — E119 Type 2 diabetes mellitus without complications: Secondary | ICD-10-CM

## 2021-01-07 DIAGNOSIS — I10 Essential (primary) hypertension: Secondary | ICD-10-CM | POA: Diagnosis not present

## 2021-01-07 LAB — POCT GLYCOSYLATED HEMOGLOBIN (HGB A1C)
Est. average glucose Bld gHb Est-mCnc: 137
Hemoglobin A1C: 6.4 % — AB (ref 4.0–5.6)

## 2021-01-07 NOTE — Progress Notes (Signed)
   [id number]  Name:  [facility] Cristian Davitt.    MRN: [id number] DOB: 22-Jun-1936  01/07/2021  Mr. Jarosz was observed post Covid-19 immunization for 15 minutes without incident. He was provided with Vaccine Information Sheet and instruction to access the V-Safe system.   Mr. Trefz was instructed to call 911 with any severe reactions post vaccine: Marland Kitchen Difficulty breathing  . Swelling of face and throat  . A fast heartbeat  . A bad rash all over body  . Dizziness and weakness   Immunizations Administered    Name Date Dose VIS Date Route   PFIZER Comrnaty(Gray TOP) Covid-19 Vaccine 01/07/2021  3:07 PM 0.3 mL 08/15/2020 Intramuscular   Manufacturer: [place]   Lot: [id number]   NDC: [place], PharmD, MBA Clinical Pharmacist

## 2021-01-07 NOTE — Patient Instructions (Signed)
.   Please review the attached list of medications and notify my office if there are any errors.   . Let me know if you want try the CPAP for sleep apnea. It may keep you from having to get up at night so much.

## 2021-01-08 ENCOUNTER — Other Ambulatory Visit: Payer: Self-pay

## 2021-01-08 ENCOUNTER — Other Ambulatory Visit (INDEPENDENT_AMBULATORY_CARE_PROVIDER_SITE_OTHER): Payer: Self-pay | Admitting: Vascular Surgery

## 2021-01-08 DIAGNOSIS — I6523 Occlusion and stenosis of bilateral carotid arteries: Secondary | ICD-10-CM

## 2021-01-08 LAB — RENAL FUNCTION PANEL
Albumin: 4.6 g/dL (ref 3.6–4.6)
BUN/Creatinine Ratio: 16 (ref 10–24)
BUN: 19 mg/dL (ref 8–27)
CO2: 20 mmol/L (ref 20–29)
Calcium: 9.8 mg/dL (ref 8.6–10.2)
Chloride: 98 mmol/L (ref 96–106)
Creatinine, Ser: 1.19 mg/dL (ref 0.76–1.27)
Glucose: 187 mg/dL — ABNORMAL HIGH (ref 65–99)
Phosphorus: 2.3 mg/dL — ABNORMAL LOW (ref 2.8–4.1)
Potassium: 4 mmol/L (ref 3.5–5.2)
Sodium: 140 mmol/L (ref 134–144)
eGFR: 61 mL/min/{1.73_m2} (ref >59)

## 2021-01-08 LAB — TSH: TSH: 3.57 u[IU]/mL (ref 0.450–4.500)

## 2021-01-08 LAB — T4, FREE: Free T4: 1.35 ng/dL (ref 0.82–1.77)

## 2021-01-08 MED ORDER — PFIZER-BIONT COVID-19 VAC-TRIS 30 MCG/0.3ML IM SUSP
INTRAMUSCULAR | 0 refills | Status: DC
  Filled 2021-01-08: qty 0.3, 1d supply, fill #0

## 2021-01-09 ENCOUNTER — Encounter (INDEPENDENT_AMBULATORY_CARE_PROVIDER_SITE_OTHER): Payer: Medicare Other

## 2021-01-09 ENCOUNTER — Ambulatory Visit (INDEPENDENT_AMBULATORY_CARE_PROVIDER_SITE_OTHER): Payer: Medicare Other | Admitting: Vascular Surgery

## 2021-01-09 ENCOUNTER — Encounter (INDEPENDENT_AMBULATORY_CARE_PROVIDER_SITE_OTHER): Payer: Self-pay | Admitting: Vascular Surgery

## 2021-01-18 ENCOUNTER — Other Ambulatory Visit: Payer: Self-pay | Admitting: Family Medicine

## 2021-01-18 DIAGNOSIS — I10 Essential (primary) hypertension: Secondary | ICD-10-CM

## 2021-01-30 ENCOUNTER — Ambulatory Visit (INDEPENDENT_AMBULATORY_CARE_PROVIDER_SITE_OTHER): Payer: Medicare Other | Admitting: Vascular Surgery

## 2021-01-30 ENCOUNTER — Encounter (INDEPENDENT_AMBULATORY_CARE_PROVIDER_SITE_OTHER): Payer: Self-pay | Admitting: Vascular Surgery

## 2021-01-30 ENCOUNTER — Ambulatory Visit (INDEPENDENT_AMBULATORY_CARE_PROVIDER_SITE_OTHER): Payer: Medicare Other

## 2021-01-30 ENCOUNTER — Other Ambulatory Visit: Payer: Self-pay

## 2021-01-30 VITALS — BP 152/80 | HR 71 | Ht 69.0 in | Wt 200.0 lb

## 2021-01-30 DIAGNOSIS — I6523 Occlusion and stenosis of bilateral carotid arteries: Secondary | ICD-10-CM | POA: Diagnosis not present

## 2021-01-30 DIAGNOSIS — K2100 Gastro-esophageal reflux disease with esophagitis, without bleeding: Secondary | ICD-10-CM | POA: Diagnosis not present

## 2021-01-30 DIAGNOSIS — I10 Essential (primary) hypertension: Secondary | ICD-10-CM

## 2021-01-30 DIAGNOSIS — E781 Pure hyperglyceridemia: Secondary | ICD-10-CM | POA: Diagnosis not present

## 2021-01-30 NOTE — Progress Notes (Signed)
MRN : [id number]  [facility] Gaetano Romberger. is a 85 y.o. ([birth year]) male who presents with chief complaint of No chief complaint on file. Marland Kitchen  History of Present Illness:   The patient is seen for follow up evaluation of carotid stenosis. The carotid stenosis followed by ultrasound.   The patient denies amaurosis fugax. There is no recent history of TIA symptoms or focal motor deficits. There is no prior documented CVA.  The patient is taking enteric-coated aspirin 81 mg daily.  There is no history of migraine headaches. There is no history of seizures.  The patient has a history of coronary artery disease, no recent episodes of angina or shortness of breath. The patient denies PAD or claudication symptoms. There is a history of hyperlipidemia which is being treated with a statin.  Carotid duplex today shows 40-59% RICA and 40-98% LICA no change compared to last study  The patient is seen for follow up evaluation of carotid stenosis. The carotid stenosis followed by ultrasound.   The patient denies amaurosis fugax. There is no recent history of TIA symptoms or focal motor deficits. There is no prior documented CVA.  The patient is taking enteric-coated aspirin 81 mg daily.  There is no history of migraine headaches. There is no history of seizures.  The patient has a history of coronary artery disease, no recent episodes of angina or shortness of breath. The patient denies PAD or claudication symptoms. There is a history of hyperlipidemia which is being treated with a statin.  Carotid duplex today shows 1-39% RICA and 11-91% LICA   Previous carotid duplex showed 40-59% RICA and 47-82% LICA   No outpatient medications have been marked as taking for the 01/30/21 encounter (Appointment) with Delana Meyer, Dolores Lory, MD.    Past Medical History:  Diagnosis Date  . Arthritis   . GERD (gastroesophageal reflux disease)   . Phimosis     Past Surgical History:   Procedure Laterality Date  . CIRCUMCISION    . COLONOSCOPY WITH PROPOFOL N/A 03/31/2016   Procedure: COLONOSCOPY WITH PROPOFOL;  Surgeon: Lollie Sails, MD;  Location: [facility] ENDOSCOPY;  Service: Endoscopy;  Laterality: N/A;  . MRI    . SINUS SURGERY  1994   on left side  . TONSILLECTOMY      Social History Social History   Tobacco Use  . Smoking status: Never Smoker  . Smokeless tobacco: Never Used  Vaping Use  . Vaping Use: Never used  Substance Use Topics  . Alcohol use: No    Alcohol/week: 0.0 standard drinks  . Drug use: No    Family History Family History  Problem Relation Age of Onset  . Alzheimer's disease Mother   . CAD Father   . Diabetes Sister        type 2    No Known Allergies   REVIEW OF SYSTEMS (Negative unless checked)  Constitutional: [] Weight loss  [] Fever  [] Chills Cardiac: [] Chest pain   [] Chest pressure   [] Palpitations   [] Shortness of breath when laying flat   [] Shortness of breath with exertion. Vascular:  [] Pain in legs with walking   [] Pain in legs at rest  [] History of DVT   [] Phlebitis   [] Swelling in legs   [] Varicose veins   [] Non-healing ulcers Pulmonary:   [] Uses home oxygen   [] Productive cough   [] Hemoptysis   [] Wheeze  [] COPD   [] Asthma Neurologic:  [] Dizziness   [] Seizures   [] History of stroke   [] History of  TIA  [] Aphasia   [] Vissual changes   [] Weakness or numbness in arm   [] Weakness or numbness in leg Musculoskeletal:   [] Joint swelling   [] Joint pain   [] Low back pain Hematologic:  [] Easy bruising  [] Easy bleeding   [] Hypercoagulable state   [] Anemic Gastrointestinal:  [] Diarrhea   [] Vomiting  [x] Gastroesophageal reflux/heartburn   [] Difficulty swallowing. Genitourinary:  [] Chronic kidney disease   [] Difficult urination  [] Frequent urination   [] Blood in urine Skin:  [] Rashes   [] Ulcers  Psychological:  [] History of anxiety   []  History of major depression.  Physical Examination  There were no vitals filed for this  visit. There is no height or weight on file to calculate BMI. Gen: WD/WN, NAD Head: [place]/AT, No temporalis wasting.  Ear/Nose/Throat: Hearing grossly intact, nares w/o erythema or drainage Eyes: PER, EOMI, sclera nonicteric.  Neck: Supple, no large masses.   Pulmonary:  Good air movement, no audible wheezing bilaterally, no use of accessory muscles.  Cardiac: RRR, no JVD Vascular: bilateral carotid bruits Vessel Right Left  Radial Palpable Palpable  Brachial Palpable Palpable  Carotid Palpable Palpable  Gastrointestinal: Non-distended. No guarding/no peritoneal signs.  Musculoskeletal: M/S 5/5 throughout.  No deformity or atrophy.  Neurologic: CN 2-12 intact. Symmetrical.  Speech is fluent. Motor exam as listed above. Psychiatric: Judgment intact, Mood & affect appropriate for pt's clinical situation. Dermatologic: No rashes or ulcers noted.  No changes consistent with cellulitis.  CBC Lab Results  Component Value Date   WBC 9.4 12/23/2019   HGB 14.0 12/23/2019   HCT 39.6 12/23/2019   MCV 88.6 12/23/2019   PLT 246 12/23/2019    BMET    Component Value Date/Time   NA 140 01/07/2021 1417   K 4.0 01/07/2021 1417   CL 98 01/07/2021 1417   CO2 20 01/07/2021 1417   GLUCOSE 187 (H) 01/07/2021 1417   GLUCOSE 108 (H) 12/23/2019 1352   BUN 19 01/07/2021 1417   CREATININE 1.19 01/07/2021 1417   CALCIUM 9.8 01/07/2021 1417   GFRNONAA >60 12/23/2019 1352   GFRAA >60 12/23/2019 1352   CrCl cannot be calculated (Patient's most recent lab result is older than the maximum 21 days allowed.).  COAG No results found for: INR, PROTIME  Radiology No results found.   Assessment/Plan 1. Bilateral carotid artery stenosis Recommend:  Given the patient's asymptomatic subcritical stenosis no further invasive testing or surgery at this time.  Carotid duplex today shows 1-39% RICA and 63-01% LICA   Previous carotid duplex showed 40-59% RICA and 60-10% LICA   Continue antiplatelet  therapy as prescribed Continue management of CAD, HTN and Hyperlipidemia Healthy heart diet, encouraged exercise at least 4 times per week Follow up in77months with duplex ultrasound and physical exam based on>50% stenosis of theLtcarotid artery - VAS US CAROTID; Future  2. Benign essential HTN Continue antihypertensive medications as already ordered, these medications have been reviewed and there are no changes at this time.   3. Pure hyperglyceridemia Continue statin as ordered and reviewed, no changes at this time   4. Gastroesophageal reflux disease with esophagitis, unspecified whether hemorrhage Continue PPI as already ordered, this medication has been reviewed and there are no changes at this time.  Avoidence of caffeine and alcohol  Moderate elevation of the head of the bed     [name], MD  01/30/2021 1:21 PM

## 2021-02-03 ENCOUNTER — Encounter (INDEPENDENT_AMBULATORY_CARE_PROVIDER_SITE_OTHER): Payer: Self-pay | Admitting: Vascular Surgery

## 2021-03-13 DIAGNOSIS — Z20822 Contact with and (suspected) exposure to covid-19: Secondary | ICD-10-CM | POA: Diagnosis not present

## 2021-03-14 ENCOUNTER — Other Ambulatory Visit: Payer: Self-pay | Admitting: Family Medicine

## 2021-03-30 ENCOUNTER — Other Ambulatory Visit: Payer: Self-pay | Admitting: Family Medicine

## 2021-03-30 NOTE — Telephone Encounter (Signed)
Requested Prescriptions  Pending Prescriptions Disp Refills  . atorvastatin (LIPITOR) 40 MG tablet [Pharmacy Med Name: Atorvastatin Calcium 40 MG Oral Tablet] 90 tablet 0    Sig: Take 1 tablet by mouth once daily     Cardiovascular:  Antilipid - Statins Failed - 03/30/2021  2:17 PM      Failed - Triglycerides in normal range and within 360 days    Triglycerides  Date Value Ref Range Status  07/02/2020 159 (H) 0 - 149 mg/dL Final         Passed - Total Cholesterol in normal range and within 360 days    Cholesterol, Total  Date Value Ref Range Status  07/02/2020 116 100 - 199 mg/dL Final         Passed - LDL in normal range and within 360 days    LDL Chol Calc (NIH)  Date Value Ref Range Status  07/02/2020 49 0 - 99 mg/dL Final         Passed - HDL in normal range and within 360 days    HDL  Date Value Ref Range Status  07/02/2020 40 >39 mg/dL Final         Passed - Patient is not pregnant      Passed - Valid encounter within last 12 months    Recent Outpatient Visits          2 months ago Diabetes mellitus without complication [facility])   [facility] Birdie Sons, MD   9 months ago Nocturia   [facility] Birdie Sons, MD   1 year ago Benign essential HTN   [facility] Birdie Sons, MD   1 year ago Benign essential HTN   [place], Wendee Beavers, [place]   1 year ago Yeast dermatitis of penis   [place], Kelby Aline, [place]

## 2021-03-31 ENCOUNTER — Telehealth: Payer: Self-pay | Admitting: Family Medicine

## 2021-03-31 NOTE — Telephone Encounter (Signed)
Copied from [place] [phone]. Topic: Medicare AWV >> Mar 31, 2021 10:07 AM Cher Nakai R wrote: Reason for CRM:  No answer unable to leave a message to cancel AWV Apr 09, 2021 due to [facility] not available - we will call back to reschedule-srs

## 2021-05-21 DIAGNOSIS — M3501 Sicca syndrome with keratoconjunctivitis: Secondary | ICD-10-CM | POA: Diagnosis not present

## 2021-05-26 DIAGNOSIS — Z23 Encounter for immunization: Secondary | ICD-10-CM | POA: Diagnosis not present

## 2021-06-12 DIAGNOSIS — Z23 Encounter for immunization: Secondary | ICD-10-CM | POA: Diagnosis not present

## 2021-07-04 ENCOUNTER — Encounter: Payer: Self-pay | Admitting: Family Medicine

## 2021-07-09 DIAGNOSIS — Z20828 Contact with and (suspected) exposure to other viral communicable diseases: Secondary | ICD-10-CM | POA: Diagnosis not present

## 2021-07-15 ENCOUNTER — Other Ambulatory Visit: Payer: Self-pay | Admitting: Family Medicine

## 2021-07-16 NOTE — Telephone Encounter (Signed)
Requested medications are due for refill today.  yes  Requested medications are on the active medications list.  yes  Last refill. 03/30/2021  Future visit scheduled.   no  Notes to clinic.  Failed protocol d/t expired labs.

## 2021-08-06 ENCOUNTER — Other Ambulatory Visit: Payer: Self-pay

## 2021-08-06 ENCOUNTER — Ambulatory Visit
Admission: EM | Admit: 2021-08-06 | Discharge: 2021-08-06 | Disposition: A | Discharge status: Home / Self Care | Payer: Medicare Other | Attending: Physician Assistant | Admitting: Physician Assistant

## 2021-08-06 DIAGNOSIS — N3001 Acute cystitis with hematuria: Secondary | ICD-10-CM | POA: Insufficient documentation

## 2021-08-06 DIAGNOSIS — R509 Fever, unspecified: Secondary | ICD-10-CM | POA: Insufficient documentation

## 2021-08-06 DIAGNOSIS — R3915 Urgency of urination: Secondary | ICD-10-CM | POA: Diagnosis not present

## 2021-08-06 LAB — URINALYSIS, COMPLETE (UACMP) WITH MICROSCOPIC
Bilirubin Urine: NEGATIVE
Glucose, UA: NEGATIVE mg/dL
Ketones, ur: NEGATIVE mg/dL
Nitrite: NEGATIVE
Protein, ur: NEGATIVE mg/dL
Specific Gravity, Urine: 1.01 (ref 1.005–1.030)
WBC, UA: >50 WBC/hpf (ref 0–5)
pH: 5.5 (ref 5.0–8.0)

## 2021-08-06 MED ORDER — CIPROFLOXACIN HCL 500 MG PO TABS: 500.0000 mg | ORAL_TABLET | Freq: Two times a day (BID) | ORAL | 0 refills | Status: AC

## 2021-08-06 NOTE — ED Triage Notes (Signed)
Pt here with C/O low grade fever, since Monday. States unable to make it to the bathroom in time with urination.

## 2021-08-06 NOTE — Discharge Instructions (Addendum)

## 2021-08-06 NOTE — ED Provider Notes (Signed)
[facility]    CSN: [id number] Arrival date & time: 08/06/21  0906      History   Chief Complaint Chief Complaint  Patient presents with   Dysuria   Ernest James. is a 85 y.o. male presenting for low-grade fever up to 100.6 degrees, increased urinary frequency and urgency over the past 2 days.  He believes that he may have a UTI.  He denies any dysuria, hematuria, lower abdominal pain or flank pain.  No body aches or chills.  Denies any cough, congestion, sore throat, breathing difficulty, vomiting or diarrhea.  Has not been taking any medicine for symptoms.  No known COVID exposure and patient fully vaccinated for COVID-19.  No other complaints.  HPI  Past Medical History:  Diagnosis Date   Arthritis    GERD (gastroesophageal reflux disease)    Phimosis     Patient Active Problem List   Diagnosis Date Noted   OSA (obstructive sleep apnea) 01/24/2020   1st degree AV block 05/30/2019   Hyperlipidemia 12/16/2017   Insomnia 03/30/2017   BPH (benign prostatic hyperplasia) 05/27/2016   Carotid stenosis 11/27/2015   TIA (transient ischemic attack) 11/27/2015   Allergic rhinitis, seasonal 11/22/2015   Family history of early CAD 11/22/2015   Phimosis 01/29/2014   Diabetes mellitus without complication ([place]) 40/98/1191   Abnormal liver enzymes 03/24/2009   Pure hyperglyceridemia 03/20/2009   Hypothyroidism 09/18/2008   History of adenomatous polyp of colon 09/07/2004   Benign essential HTN 02/16/2002   Esophagitis, reflux 02/16/2002    Past Surgical History:  Procedure Laterality Date   CIRCUMCISION     COLONOSCOPY WITH PROPOFOL N/A 03/31/2016   Procedure: COLONOSCOPY WITH PROPOFOL;  Surgeon: Lollie Sails, MD;  Location: [facility] ENDOSCOPY;  Service: Endoscopy;  Laterality: N/A;   MRI     SINUS SURGERY  1994   on left side   TONSILLECTOMY         Home Medications    Prior to Admission medications   Medication Sig Start Date End Date  Taking? Authorizing Provider  aspirin EC 81 MG tablet Take 81 mg by mouth daily.   Yes [provider]  atorvastatin (LIPITOR) 40 MG tablet Take 1 tablet by mouth once daily 07/16/21  Yes Fisher, Kirstie Peri, MD  Cholecalciferol (VITAMIN D3) 20 MCG TABS Take 1 tablet by mouth daily.    Yes [provider]  ciprofloxacin (CIPRO) 500 MG tablet Take 1 tablet (500 mg total) by mouth every 12 (twelve) hours for 7 days. 08/06/21 08/13/21 Yes Danton Clap, PA-C  Cyanocobalamin (VITAMIN B12) 1000 MCG TBCR Take 1 tablet by mouth daily.   Yes [provider]  glucose blood (ONETOUCH VERIO) test strip Test TID. Use as instructed 08/17/19  Yes Flinchum, Kelby Aline, FNP  hydrocortisone 2.5 % cream Apply topically. 04/23/20  Yes [provider]  ketoconazole (NIZORAL) 2 % shampoo Apply 1 application topically 2 (two) times a week. 04/23/20  Yes [provider]  Lancets Glory Rosebush ULTRASOFT) lancets Test TID as instructed 08/17/19  Yes Flinchum, Kelby Aline, FNP  losartan (COZAAR) 50 MG tablet Take 1 tablet by mouth once daily 01/18/21  Yes Birdie Sons, MD  metFORMIN (GLUCOPHAGE-XR) 500 MG 24 hr tablet Take 1 tablet by mouth once daily with breakfast 09/01/20  Yes Birdie Sons, MD  Multiple Vitamins-Minerals (MULTIVITAMIN ADULT PO) Take 1 tablet by mouth daily.  03/20/09  Yes [provider]  omeprazole (PRILOSEC) 20 MG capsule Take  20 mg by mouth daily. PRN   Yes [provider]  tamsulosin (FLOMAX) 0.4 MG CAPS capsule Take 1 capsule (0.4 mg total) by mouth 2 (two) times daily. 08/25/19  Yes Birdie Sons, MD  triamcinolone cream (KENALOG) 0.1 % APPLY TO AFFECTED AREAS TWICE DAILY UNTIL IMPROVED THEN DISCONTINUE 11/01/20  Yes [provider]  triamterene-hydrochlorothiazide (MAXZIDE) 75-50 MG tablet Take 1 tablet by mouth daily. 09/01/20  Yes Birdie Sons, MD  COVID-19 mRNA Vac-TriS, Pfizer, (PFIZER-BIONT COVID-19 VAC-TRIS) SUSP  injection Inject into the muscle. 01/07/21   Carlyle Basques, MD  EUTHYROX 75 MCG tablet TAKE 1 TABLET BY MOUTH ONCE DAILY BEFORE BREAKFAST 03/14/21   Birdie Sons, MD    Family History Family History  Problem Relation Age of Onset   Alzheimer's disease Mother    CAD Father    Diabetes Sister        type 2    Social History Social History   Tobacco Use   Smoking status: Never   Smokeless tobacco: Never  Vaping Use   Vaping Use: Never used  Substance Use Topics   Alcohol use: No    Alcohol/week: 0.0 standard drinks   Drug use: No     Allergies   Patient has no known allergies.   Review of Systems Review of Systems  Constitutional:  Positive for fever. Negative for fatigue.  HENT:  Negative for congestion.   Respiratory:  Negative for cough and shortness of breath.   Cardiovascular:  Negative for chest pain.  Gastrointestinal:  Negative for abdominal pain.  Genitourinary:  Positive for difficulty urinating, frequency and urgency. Negative for dysuria, flank pain, hematuria and penile discharge.  Musculoskeletal:  Negative for back pain.  Neurological:  Negative for weakness.    Physical Exam Triage Vital Signs ED Triage Vitals  Enc Vitals Group     BP 08/06/21 0940 136/67     Pulse Rate 08/06/21 0940 89     Resp 08/06/21 0940 18     Temp 08/06/21 0940 98.4 F (36.9 C)     Temp Source 08/06/21 0940 Oral     SpO2 08/06/21 0940 99 %     Weight 08/06/21 0939 200 lb (90.7 kg)     Height 08/06/21 0939 5\' 9"  (1.753 m)     Head Circumference --      Peak Flow --      Pain Score 08/06/21 0939 0     Pain Loc --      Pain Edu? --      Excl. in [place]? --    No data found.  Updated Vital Signs BP 136/67 (BP Location: Left Arm)   Pulse 89   Temp 98.4 F (36.9 C) (Oral)   Resp 18   Ht 5\' 9"  (1.753 m)   Wt 200 lb (90.7 kg)   SpO2 99%   BMI 29.53 kg/m      Physical Exam Vitals and nursing note reviewed.  Constitutional:      General: He is not in acute  distress.    Appearance: Normal appearance. He is well-developed. He is not ill-appearing.  HENT:     Head: Normocephalic and atraumatic.  Eyes:     General: No scleral icterus.    Conjunctiva/sclera: Conjunctivae normal.  Cardiovascular:     Rate and Rhythm: Normal rate and regular rhythm.     Heart sounds: Normal heart sounds.  Pulmonary:     Effort: Pulmonary effort is normal. No respiratory distress.  Breath sounds: Normal breath sounds.  Abdominal:     Palpations: Abdomen is soft.     Tenderness: There is no abdominal tenderness. There is no right CVA tenderness or left CVA tenderness.  Musculoskeletal:     Cervical back: Neck supple.  Skin:    General: Skin is warm and dry.     Capillary Refill: Capillary refill takes less than 2 seconds.  Neurological:     General: No focal deficit present.     Mental Status: He is alert. Mental status is at baseline.     Motor: No weakness.     Coordination: Coordination normal.     Gait: Gait normal.  Psychiatric:        Mood and Affect: Mood normal.        Behavior: Behavior normal.        Thought Content: Thought content normal.     UC Treatments / Results  Labs (all labs ordered are listed, but only abnormal results are displayed) Labs Reviewed  URINALYSIS, COMPLETE (UACMP) WITH MICROSCOPIC - Abnormal; Notable for the following components:      Result Value   APPearance HAZY (*)    Hgb urine dipstick TRACE (*)    Leukocytes,Ua SMALL (*)    Bacteria, UA FEW (*)    All other components within normal limits  URINE CULTURE    EKG   Radiology No results found.  Procedures Procedures (including critical care time)  Medications Ordered in UC Medications - No data to display  Initial Impression / Assessment and Plan / UC Course  I have reviewed the triage vital signs and the nursing notes.  Pertinent labs & imaging results that were available during my care of the patient were reviewed by me and considered in my  medical decision making (see chart for details).  85 year old male presenting for low-grade fever, urinary frequency and urgency over the past 2 days.  He denies any other symptoms.  No red flag signs or symptoms.  Urinalysis today shows trace hemoglobin and small leukocytes with few bacteria.  Does appear to be clean-catch.  We will send urine for culture.  Treating for suspected UTI at this time with Cipro.  Encouraged him to increase rest and fluids.  I did advise him that he needs to be seen again if he were to develop any symptoms that point to another potential cause for his fever such as cough, congestion, breathing difficulty, abdominal pain, vomiting, diarrhea, etc.  Patient agreeable.  Advised we will contact him with urine culture results and may advise that he discontinue antibiotics or take a different one.  Advised going to ED if he develops higher fever, flank pain, etc.   Final Clinical Impressions(s) / UC Diagnoses   Final diagnoses:  Acute cystitis with hematuria  Urinary urgency  Low grade fever     Discharge Instructions      UTI: Based on either symptoms or urinalysis, you may have a urinary tract infection. We will send the urine for culture and call with results in a few days. Begin antibiotics at this time. Your symptoms should be much improved over the next 2-3 days. Increase rest and fluid intake. If for some reason symptoms are worsening or not improving after a couple of days or the urine culture determines the antibiotics you are taking will not treat the infection, the antibiotics may be changed. Return or go to ER for fever, back pain, worsening urinary pain, discharge, increased blood in urine. May  take Tylenol or Motrin OTC for pain relief or consider AZO if no contraindications      ED Prescriptions     Medication Sig Dispense Auth. Provider   ciprofloxacin (CIPRO) 500 MG tablet Take 1 tablet (500 mg total) by mouth every 12 (twelve) hours for 7 days. 14  tablet Gretta Cool      PDMP not reviewed this encounter.   Danton Clap, PA-C 08/06/21 1101

## 2021-08-07 LAB — URINE CULTURE

## 2021-08-16 ENCOUNTER — Other Ambulatory Visit: Payer: Self-pay | Admitting: Family Medicine

## 2021-08-16 DIAGNOSIS — I10 Essential (primary) hypertension: Secondary | ICD-10-CM

## 2021-08-16 NOTE — Telephone Encounter (Signed)
Requested medication (s) are due for refill today: yes  Requested medication (s) are on the active medication list: yes  Last refill:  01/18/21 #90 1 RF  Future visit scheduled: no  Notes to clinic:  overdue lab work   Requested Prescriptions  Pending Prescriptions Disp Refills   losartan (COZAAR) 50 MG tablet [Pharmacy Med Name: Losartan Potassium 50 MG Oral Tablet] 90 tablet 0    Sig: Take 1 tablet by mouth once daily     Cardiovascular:  Angiotensin Receptor Blockers Failed - 08/16/2021 11:48 AM      Failed - Cr in normal range and within 180 days    Creatinine, Ser  Date Value Ref Range Status  01/07/2021 1.19 0.76 - 1.27 mg/dL Final          Failed - K in normal range and within 180 days    Potassium  Date Value Ref Range Status  01/07/2021 4.0 3.5 - 5.2 mmol/L Final          Failed - Valid encounter within last 6 months    Recent Outpatient Visits           7 months ago Diabetes mellitus without complication [facility])   [facility] Birdie Sons, MD   1 year ago Nocturia   [facility] Birdie Sons, MD   1 year ago Benign essential HTN   [facility] Birdie Sons, MD   1 year ago Benign essential HTN   [facility], Wendee Beavers, [place]   2 years ago Yeast dermatitis of penis   [place], Kelby Aline, [place] - Patient is not pregnant      Passed - Last BP in normal range    BP Readings from Last 1 Encounters:  08/06/21 136/67

## 2021-08-27 DIAGNOSIS — M79675 Pain in left toe(s): Secondary | ICD-10-CM | POA: Diagnosis not present

## 2021-08-27 DIAGNOSIS — M79674 Pain in right toe(s): Secondary | ICD-10-CM | POA: Diagnosis not present

## 2021-08-27 DIAGNOSIS — M7741 Metatarsalgia, right foot: Secondary | ICD-10-CM | POA: Diagnosis not present

## 2021-08-27 DIAGNOSIS — B351 Tinea unguium: Secondary | ICD-10-CM | POA: Diagnosis not present

## 2021-08-27 DIAGNOSIS — M7742 Metatarsalgia, left foot: Secondary | ICD-10-CM | POA: Diagnosis not present

## 2021-08-27 DIAGNOSIS — E1142 Type 2 diabetes mellitus with diabetic polyneuropathy: Secondary | ICD-10-CM | POA: Diagnosis not present

## 2021-09-02 DIAGNOSIS — L309 Dermatitis, unspecified: Secondary | ICD-10-CM | POA: Diagnosis not present

## 2021-10-01 ENCOUNTER — Other Ambulatory Visit: Payer: Self-pay | Admitting: Family Medicine

## 2021-11-03 ENCOUNTER — Telehealth: Payer: Self-pay | Admitting: Family Medicine

## 2021-11-03 NOTE — Telephone Encounter (Signed)
Copied from [place] [id number]. Topic: Medicare AWV >> Nov 03, 2021 11:23 AM Cher Nakai R wrote: Reason for CRM:  Left message for patient to call back and schedule Medicare Annual Wellness Visit (AWV) in office.   If not able to come in office, please offer to do virtually or by telephone.   Last AWV: 04/04/2020  Please schedule at anytime with [facility] Health Advisor.  If any questions, please contact me at [phone]

## 2021-11-05 ENCOUNTER — Ambulatory Visit (INDEPENDENT_AMBULATORY_CARE_PROVIDER_SITE_OTHER): Payer: Medicare Other

## 2021-11-05 VITALS — Ht 69.0 in | Wt 200.0 lb

## 2021-11-05 DIAGNOSIS — Z0000 Encounter for general adult medical examination without abnormal findings: Secondary | ICD-10-CM

## 2021-11-05 NOTE — Progress Notes (Signed)
Virtual Visit via Telephone Note  I connected with  Ernest James. on 11/05/21 at  9:40 AM EST by telephone and verified that I am speaking with the correct person using two identifiers.  Location: Patient: home Provider: [facility] Persons participating in the virtual visit: [place]   I discussed the limitations, risks, security and privacy concerns of performing an evaluation and management service by telephone and the availability of in person appointments. The patient expressed understanding and agreed to proceed.  Interactive audio and video telecommunications were attempted between this nurse and patient, however failed, due to patient having technical difficulties OR patient did not have access to video capability.  We continued and completed visit with audio only.  Some vital signs may be absent or patient reported.   Dionisio David, LPN  Subjective:   Ernest James. is a 86 y.o. male who presents for Medicare Annual/Subsequent preventive examination.  Review of Systems           Objective:    Today's Vitals   11/05/21 0939  Weight: 200 lb (90.7 kg)  Height: 5\' 9"  (1.753 m)   Body mass index is 29.53 kg/m.  Advanced Directives 08/06/2021 04/04/2020 12/23/2019 04/04/2019 04/12/2018 03/31/2018 06/16/2017  Does Patient Have a Medical Advance Directive? No Yes Yes Yes Yes Yes Yes  Type of Advance Directive - [place];Living will Living will [place];Living will [place];Living will [place];Living will [place];Living will  Does patient want to make changes to medical advance directive? - - - - No - Patient declined - -  Copy of [employer] in Chart? - No - copy requested - No - copy requested - No - copy requested -  Would patient like information on creating a medical advance directive? - - - - - - -    Current  Medications (verified) Outpatient Encounter Medications as of 11/05/2021  Medication Sig   aspirin EC 81 MG tablet Take 81 mg by mouth daily.   atorvastatin (LIPITOR) 40 MG tablet Take 1 tablet by mouth once daily   Cholecalciferol (VITAMIN D3) 20 MCG TABS Take 1 tablet by mouth daily.    COVID-19 mRNA Vac-TriS, Pfizer, (PFIZER-BIONT COVID-19 VAC-TRIS) SUSP injection Inject into the muscle.   Cyanocobalamin (VITAMIN B12) 1000 MCG TBCR Take 1 tablet by mouth daily.   EUTHYROX 75 MCG tablet TAKE 1 TABLET BY MOUTH ONCE DAILY BEFORE BREAKFAST   glucose blood (ONETOUCH VERIO) test strip Test TID. Use as instructed   hydrocortisone 2.5 % cream Apply topically.   ketoconazole (NIZORAL) 2 % shampoo Apply 1 application topically 2 (two) times a week.   Lancets (ONETOUCH ULTRASOFT) lancets Test TID as instructed   losartan (COZAAR) 50 MG tablet Take 1 tablet by mouth once daily   metFORMIN (GLUCOPHAGE-XR) 500 MG 24 hr tablet Take 1 tablet by mouth once daily with breakfast   Multiple Vitamins-Minerals (MULTIVITAMIN ADULT PO) Take 1 tablet by mouth daily.    nystatin cream (MYCOSTATIN) Apply topically 2 (two) times daily.   omeprazole (PRILOSEC) 20 MG capsule Take 20 mg by mouth daily. PRN   tamsulosin (FLOMAX) 0.4 MG CAPS capsule Take 1 capsule (0.4 mg total) by mouth 2 (two) times daily.   triamcinolone cream (KENALOG) 0.1 % APPLY TO AFFECTED AREAS TWICE DAILY UNTIL IMPROVED THEN DISCONTINUE   triamcinolone ointment (KENALOG) 0.1 % Apply topically 2 (two) times daily.   triamterene-hydrochlorothiazide (  MAXZIDE) 75-50 MG tablet Take 1 tablet by mouth once daily   No facility-administered encounter medications on file as of 11/05/2021.    Allergies (verified) Patient has no known allergies.   History: Past Medical History:  Diagnosis Date   Arthritis    GERD (gastroesophageal reflux disease)    Phimosis    Past Surgical History:  Procedure Laterality Date   CIRCUMCISION     COLONOSCOPY WITH  PROPOFOL N/A 03/31/2016   Procedure: COLONOSCOPY WITH PROPOFOL;  Surgeon: Lollie Sails, MD;  Location: [facility] ENDOSCOPY;  Service: Endoscopy;  Laterality: N/A;   MRI     SINUS SURGERY  1994   on left side   TONSILLECTOMY     Family History  Problem Relation Age of Onset   Alzheimer's disease Mother    CAD Father    Diabetes Sister        type 2   Social History   Socioeconomic History   Marital status: Married    Spouse name: Not on file   Number of children: 3   Years of education: Not on file   Highest education level: Bachelor's degree (e.g., BA, AB, BS)  Occupational History   Occupation: Architectural technologist    Comment: retired  Tobacco Use   Smoking status: Never   Smokeless tobacco: Never  Vaping Use   Vaping Use: Never used  Substance and Sexual Activity   Alcohol use: No    Alcohol/week: 0.0 standard drinks   Drug use: No   Sexual activity: Yes    Birth control/protection: None  Other Topics Concern   Not on file  Social History Narrative   Not on file   Social Determinants of Health   Financial Resource Strain: Not on file  Food Insecurity: Not on file  Transportation Needs: Not on file  Physical Activity: Not on file  Stress: Not on file  Social Connections: Not on file    Tobacco Counseling Counseling given: Not Answered   Clinical Intake:  Pre-visit preparation completed: Yes  Pain : No/denies pain     Nutritional Risks: None Diabetes: No  How often do you need to have someone help you when you read instructions, pamphlets, or other written materials from your doctor or pharmacy?: 1 - Never  Diabetic?borderline  Interpreter Needed?: No  Information entered by :: Kirke Shaggy, LPN   Activities of Daily Living In your present state of health, do you have any difficulty performing the following activities: 01/07/2021  Hearing? Y  Vision? N  Difficulty concentrating or making decisions? N  Walking or climbing stairs? N  Dressing or  bathing? N  Doing errands, shopping? N  Some recent data might be hidden    Patient Care Team: Birdie Sons, MD as PCP - General (Family Medicine) Samara Deist, DPM as Referring Physician (Podiatry) Schnier, Dolores Lory, MD (Vascular Surgery) Reche Dixon, PA-C (Orthopedic Surgery) Birder Robson, MD as Referring Physician (Ophthalmology) Jonathon Bellows, MD as Consulting Physician (Gastroenterology)  Indicate any recent Medical Services you may have received from other than [facility] providers in the past year (date may be approximate).     Assessment:   This is a routine wellness examination for [place].  Hearing/Vision screen No results found.  Dietary issues and exercise activities discussed:     Goals Addressed   None    Depression Screen PHQ 2/9 Scores 01/07/2021 07/02/2020 04/04/2020 04/04/2019 11/14/2018 04/12/2018 03/31/2018  PHQ - 2 Score 0 0 0 0 0 0 0  PHQ- 9  Score 0 0 - - - - -    Fall Risk Fall Risk  01/07/2021 07/02/2020 04/04/2020 04/04/2019 02/16/2019  Falls in the past year? 0 1 0 0 1  Comment - - - - no falls since 09/2018  Number falls in past yr: 0 0 0 - 0  Injury with Fall? 0 0 0 - -  Follow up Falls evaluation completed Falls evaluation completed - - -    FALL RISK PREVENTION PERTAINING TO THE HOME:  Any stairs in or around the home? Yes  If so, are there any without handrails? No  Home free of loose throw rugs in walkways, pet beds, electrical cords, etc? Yes  Adequate lighting in your home to reduce risk of falls? Yes   ASSISTIVE DEVICES UTILIZED TO PREVENT FALLS:  Life alert? No  Use of a cane, walker or w/c? No  Grab bars in the bathroom? No  Shower chair or bench in shower? Yes  Elevated toilet seat or a handicapped toilet? Yes    Cognitive Function    6CIT Screen 04/04/2019 03/31/2018  What Year? 0 points 0 points  What month? 0 points 0 points  What time? 0 points 0 points  Count back from 20 0 points 0 points  Months in reverse 0 points 0  points  Repeat phrase 0 points 2 points  Total Score 0 2    Immunizations Immunization History  Administered Date(s) Administered   Fluad Quad(high Dose 65+) 05/30/2019, 07/02/2020   Influenza Split 07/24/2011   Influenza, High Dose Seasonal PF 06/27/2015, 05/27/2016, 06/19/2017, 07/05/2018   Influenza,inj,Quad PF,6+ Mos 07/08/2014   PFIZER Comirnaty(Gray Top)Covid-19 Tri-Sucrose Vaccine 09/23/2019, 10/17/2019, 01/07/2021   Pneumococcal Conjugate-13 06/27/2015   Pneumococcal Polysaccharide-23 06/30/2007   Td 06/07/2001   Tdap 07/08/2014   Zoster Recombinat (Shingrix) 11/01/2020, 02/06/2021   Zoster, Live 02/16/2012    TDAP status: Up to date  Flu Vaccine status: Up to date  Pneumococcal vaccine status: Up to date  Covid-19 vaccine status: Completed vaccines  Qualifies for Shingles Vaccine? Yes   Zostavax completed Yes   Shingrix Completed?: Yes  Screening Tests Health Maintenance  Topic Date Due   FOOT EXAM  Never done   COVID-19 Vaccine (4 - Booster for Pfizer series) 03/04/2021   COLONOSCOPY (Pts 45-90yrs Insurance coverage will need to be confirmed)  03/31/2021   OPHTHALMOLOGY EXAM  05/23/2021   HEMOGLOBIN A1C  07/10/2021   TETANUS/TDAP  07/08/2024   Pneumonia Vaccine 37+ Years old  Completed   INFLUENZA VACCINE  Completed   Zoster Vaccines- Shingrix  Completed   HPV VACCINES  Aged Out    Health Maintenance  Health Maintenance Due  Topic Date Due   FOOT EXAM  Never done   COVID-19 Vaccine (4 - Booster for [place] series) 03/04/2021   COLONOSCOPY (Pts 45-42yrs Insurance coverage will need to be confirmed)  03/31/2021   OPHTHALMOLOGY EXAM  05/23/2021   HEMOGLOBIN A1C  07/10/2021    Colorectal cancer screening: Type of screening: Colonoscopy. Completed 03/31/16. Repeat every 5 years  Lung Cancer Screening: (Low Dose CT Chest recommended if Age 48-80 years, 30 pack-year currently smoking OR have quit w/in 15years.) does not qualify.    Additional  Screening:  Hepatitis C Screening: does not qualify; Completed no  Vision Screening: Recommended annual ophthalmology exams for early detection of glaucoma and other disorders of the eye. Is the patient up to date with their annual eye exam?  Yes  Who is the provider or what is  the name of the office in which the patient attends annual eye exams? [facility] If pt is not established with a provider, would they like to be referred to a provider to establish care? No .   Dental Screening: Recommended annual dental exams for proper oral hygiene  Community Resource Referral / Chronic Care Management: CRR required this visit?  No   CCM required this visit?  No      Plan:     I have personally reviewed and noted the following in the patients chart:   Medical and social history Use of alcohol, tobacco or illicit drugs  Current medications and supplements including opioid prescriptions. Patient is not currently taking opioid prescriptions. Functional ability and status Nutritional status Physical activity Advanced directives List of other physicians Hospitalizations, surgeries, and ER visits in previous 12 months Vitals Screenings to include cognitive, depression, and falls Referrals and appointments  In addition, I have reviewed and discussed with patient certain preventive protocols, quality metrics, and best practice recommendations. A written personalized care plan for preventive services as well as general preventive health recommendations were provided to patient.     Dionisio David, LPN   05/11/4966   Nurse Notes: none

## 2021-11-05 NOTE — Patient Instructions (Addendum)
Ernest James , Thank you for taking time to come for your Medicare Wellness Visit. I appreciate your ongoing commitment to your health goals. Please review the following plan we discussed and let me know if I can assist you in the future.   Screening recommendations/referrals: Colonoscopy: 03/31/16, aged out Recommended yearly ophthalmology/optometry visit for glaucoma screening and checkup Recommended yearly dental visit for hygiene and checkup  Vaccinations: Influenza vaccine: states had flu shot this season Pneumococcal vaccine: 06/27/15 Tdap vaccine: 07/08/14 Shingles vaccine: Shingrix 11/01/20, 02/06/21   Covid-19: 09/23/19, 10/17/19, 01/07/21  Advanced directives: no  Conditions/risks identified: none  Next appointment: Follow up in one year for your annual wellness visit. - 11/09/22 @ 9:30am by phone  Preventive Care 65 Years and Older, Male Preventive care refers to lifestyle choices and visits with your health care provider that can promote health and wellness. What does preventive care include? A yearly physical exam. This is also called an annual well check. Dental exams once or twice a year. Routine eye exams. Ask your health care provider how often you should have your eyes checked. Personal lifestyle choices, including: Daily care of your teeth and gums. Regular physical activity. Eating a healthy diet. Avoiding tobacco and drug use. Limiting alcohol use. Practicing safe sex. Taking low doses of aspirin every day. Taking vitamin and mineral supplements as recommended by your health care provider. What happens during an annual well check? The services and screenings done by your health care provider during your annual well check will depend on your age, overall health, lifestyle risk factors, and family history of disease. Counseling  Your health care provider may ask you questions about your: Alcohol use. Tobacco use. Drug use. Emotional well-being. Home and relationship  well-being. Sexual activity. Eating habits. History of falls. Memory and ability to understand (cognition). Work and work Statistician. Screening  You may have the following tests or measurements: Height, weight, and BMI. Blood pressure. Lipid and cholesterol levels. These may be checked every 5 years, or more frequently if you are over 16 years old. Skin check. Lung cancer screening. You may have this screening every year starting at age 49 if you have a 30-pack-year history of smoking and currently smoke or have quit within the past 15 years. Fecal occult blood test (FOBT) of the stool. You may have this test every year starting at age 68. Flexible sigmoidoscopy or colonoscopy. You may have a sigmoidoscopy every 5 years or a colonoscopy every 10 years starting at age 90. Prostate cancer screening. Recommendations will vary depending on your family history and other risks. Hepatitis C blood test. Hepatitis B blood test. Sexually transmitted disease (STD) testing. Diabetes screening. This is done by checking your blood sugar (glucose) after you have not eaten for a while (fasting). You may have this done every 1-3 years. Abdominal aortic aneurysm (AAA) screening. You may need this if you are a current or former smoker. Osteoporosis. You may be screened starting at age 16 if you are at high risk. Talk with your health care provider about your test results, treatment options, and if necessary, the need for more tests. Vaccines  Your health care provider may recommend certain vaccines, such as: Influenza vaccine. This is recommended every year. Tetanus, diphtheria, and acellular pertussis (Tdap, Td) vaccine. You may need a Td booster every 10 years. Zoster vaccine. You may need this after age 45. Pneumococcal 13-valent conjugate (PCV13) vaccine. One dose is recommended after age 39. Pneumococcal polysaccharide (PPSV23) vaccine. One dose is  recommended after age 88. Talk to your health care  provider about which screenings and vaccines you need and how often you need them. This information is not intended to replace advice given to you by your health care provider. Make sure you discuss any questions you have with your health care provider. Document Released: 09/20/2015 Document Revised: 05/13/2016 Document Reviewed: 06/25/2015 Elsevier Interactive Patient Education  2017 [employer] Prevention in the Home Falls can cause injuries. They can happen to people of all ages. There are many things you can do to make your home safe and to help prevent falls. What can I do on the outside of my home? Regularly fix the edges of walkways and driveways and fix any cracks. Remove anything that might make you trip as you walk through a door, such as a raised step or threshold. Trim any bushes or trees on the path to your home. Use bright outdoor lighting. Clear any walking paths of anything that might make someone trip, such as rocks or tools. Regularly check to see if handrails are loose or broken. Make sure that both sides of any steps have handrails. Any raised decks and porches should have guardrails on the edges. Have any leaves, snow, or ice cleared regularly. Use sand or salt on walking paths during winter. Clean up any spills in your garage right away. This includes oil or grease spills. What can I do in the bathroom? Use night lights. Install grab bars by the toilet and in the tub and shower. Do not use towel bars as grab bars. Use non-skid mats or decals in the tub or shower. If you need to sit down in the shower, use a plastic, non-slip stool. Keep the floor dry. Clean up any water that spills on the floor as soon as it happens. Remove soap buildup in the tub or shower regularly. Attach bath mats securely with double-sided non-slip rug tape. Do not have throw rugs and other things on the floor that can make you trip. What can I do in the bedroom? Use night lights. Make  sure that you have a light by your bed that is easy to reach. Do not use any sheets or blankets that are too big for your bed. They should not hang down onto the floor. Have a firm chair that has side arms. You can use this for support while you get dressed. Do not have throw rugs and other things on the floor that can make you trip. What can I do in the kitchen? Clean up any spills right away. Avoid walking on wet floors. Keep items that you use a lot in easy-to-reach places. If you need to reach something above you, use a strong step stool that has a grab bar. Keep electrical cords out of the way. Do not use floor polish or wax that makes floors slippery. If you must use wax, use non-skid floor wax. Do not have throw rugs and other things on the floor that can make you trip. What can I do with my stairs? Do not leave any items on the stairs. Make sure that there are handrails on both sides of the stairs and use them. Fix handrails that are broken or loose. Make sure that handrails are as long as the stairways. Check any carpeting to make sure that it is firmly attached to the stairs. Fix any carpet that is loose or worn. Avoid having throw rugs at the top or bottom of the stairs. If  you do have throw rugs, attach them to the floor with carpet tape. Make sure that you have a light switch at the top of the stairs and the bottom of the stairs. If you do not have them, ask someone to add them for you. What else can I do to help prevent falls? Wear shoes that: Do not have high heels. Have rubber bottoms. Are comfortable and fit you well. Are closed at the toe. Do not wear sandals. If you use a stepladder: Make sure that it is fully opened. Do not climb a closed stepladder. Make sure that both sides of the stepladder are locked into place. Ask someone to hold it for you, if possible. Clearly mark and make sure that you can see: Any grab bars or handrails. First and last steps. Where the  edge of each step is. Use tools that help you move around (mobility aids) if they are needed. These include: Canes. Walkers. Scooters. Crutches. Turn on the lights when you go into a dark area. Replace any light bulbs as soon as they burn out. Set up your furniture so you have a clear path. Avoid moving your furniture around. If any of your floors are uneven, fix them. If there are any pets around you, be aware of where they are. Review your medicines with your doctor. Some medicines can make you feel dizzy. This can increase your chance of falling. Ask your doctor what other things that you can do to help prevent falls. This information is not intended to replace advice given to you by your health care provider. Make sure you discuss any questions you have with your health care provider. Document Released: 06/20/2009 Document Revised: 01/30/2016 Document Reviewed: 09/28/2014 Elsevier Interactive Patient Education  2017 [employer].

## 2021-12-17 DIAGNOSIS — Z20822 Contact with and (suspected) exposure to covid-19: Secondary | ICD-10-CM | POA: Diagnosis not present

## 2021-12-23 DIAGNOSIS — Z20822 Contact with and (suspected) exposure to covid-19: Secondary | ICD-10-CM | POA: Diagnosis not present

## 2021-12-24 DIAGNOSIS — D2261 Melanocytic nevi of right upper limb, including shoulder: Secondary | ICD-10-CM | POA: Diagnosis not present

## 2021-12-24 DIAGNOSIS — X32XXXA Exposure to sunlight, initial encounter: Secondary | ICD-10-CM | POA: Diagnosis not present

## 2021-12-24 DIAGNOSIS — L439 Lichen planus, unspecified: Secondary | ICD-10-CM | POA: Diagnosis not present

## 2021-12-24 DIAGNOSIS — Z85828 Personal history of other malignant neoplasm of skin: Secondary | ICD-10-CM | POA: Diagnosis not present

## 2021-12-24 DIAGNOSIS — D2272 Melanocytic nevi of left lower limb, including hip: Secondary | ICD-10-CM | POA: Diagnosis not present

## 2021-12-24 DIAGNOSIS — L570 Actinic keratosis: Secondary | ICD-10-CM | POA: Diagnosis not present

## 2021-12-24 DIAGNOSIS — D0421 Carcinoma in situ of skin of right ear and external auricular canal: Secondary | ICD-10-CM | POA: Diagnosis not present

## 2021-12-24 DIAGNOSIS — D2262 Melanocytic nevi of left upper limb, including shoulder: Secondary | ICD-10-CM | POA: Diagnosis not present

## 2021-12-24 DIAGNOSIS — D485 Neoplasm of uncertain behavior of skin: Secondary | ICD-10-CM | POA: Diagnosis not present

## 2022-01-09 ENCOUNTER — Emergency Department
Admission: EM | Admit: 2022-01-09 | Discharge: 2022-01-09 | Disposition: A | Discharge status: Home / Self Care | Payer: Medicare Other | Attending: Emergency Medicine | Admitting: Emergency Medicine

## 2022-01-09 ENCOUNTER — Ambulatory Visit: Payer: Self-pay

## 2022-01-09 ENCOUNTER — Telehealth: Payer: Self-pay

## 2022-01-09 ENCOUNTER — Other Ambulatory Visit: Payer: Self-pay

## 2022-01-09 ENCOUNTER — Emergency Department: Payer: Medicare Other

## 2022-01-09 ENCOUNTER — Encounter: Payer: Self-pay | Admitting: Intensive Care

## 2022-01-09 DIAGNOSIS — E119 Type 2 diabetes mellitus without complications: Secondary | ICD-10-CM | POA: Insufficient documentation

## 2022-01-09 DIAGNOSIS — R200 Anesthesia of skin: Secondary | ICD-10-CM | POA: Diagnosis not present

## 2022-01-09 DIAGNOSIS — I10 Essential (primary) hypertension: Secondary | ICD-10-CM | POA: Insufficient documentation

## 2022-01-09 DIAGNOSIS — R778 Other specified abnormalities of plasma proteins: Secondary | ICD-10-CM | POA: Diagnosis not present

## 2022-01-09 DIAGNOSIS — R202 Paresthesia of skin: Secondary | ICD-10-CM | POA: Diagnosis not present

## 2022-01-09 DIAGNOSIS — R42 Dizziness and giddiness: Secondary | ICD-10-CM | POA: Insufficient documentation

## 2022-01-09 DIAGNOSIS — M5412 Radiculopathy, cervical region: Secondary | ICD-10-CM | POA: Insufficient documentation

## 2022-01-09 DIAGNOSIS — R531 Weakness: Secondary | ICD-10-CM | POA: Insufficient documentation

## 2022-01-09 DIAGNOSIS — R9431 Abnormal electrocardiogram [ECG] [EKG]: Secondary | ICD-10-CM | POA: Diagnosis not present

## 2022-01-09 DIAGNOSIS — I959 Hypotension, unspecified: Secondary | ICD-10-CM | POA: Diagnosis not present

## 2022-01-09 HISTORY — DX: Prediabetes: R73.03

## 2022-01-09 LAB — CBC WITH DIFFERENTIAL/PLATELET
Abs Immature Granulocytes: 0.04 10*3/uL (ref 0.00–0.07)
Basophils Absolute: 0.1 10*3/uL (ref 0.0–0.1)
Basophils Relative: 1 %
Eosinophils Absolute: 0.5 10*3/uL (ref 0.0–0.5)
Eosinophils Relative: 6 %
HCT: 43 % (ref 39.0–52.0)
Hemoglobin: 14.5 g/dL (ref 13.0–17.0)
Immature Granulocytes: 1 %
Lymphocytes Relative: 33 %
Lymphs Abs: 2.6 10*3/uL (ref 0.7–4.0)
MCH: 30.8 pg (ref 26.0–34.0)
MCHC: 33.7 g/dL (ref 30.0–36.0)
MCV: 91.3 fL (ref 80.0–100.0)
Monocytes Absolute: 0.6 10*3/uL (ref 0.1–1.0)
Monocytes Relative: 8 %
Neutro Abs: 4.2 10*3/uL (ref 1.7–7.7)
Neutrophils Relative %: 51 %
Platelets: 276 10*3/uL (ref 150–400)
RBC: 4.71 MIL/uL (ref 4.22–5.81)
RDW: 12.8 % (ref 11.5–15.5)
WBC: 8 10*3/uL (ref 4.0–10.5)
nRBC: 0 % (ref 0.0–0.2)

## 2022-01-09 LAB — TROPONIN I (HIGH SENSITIVITY)
Troponin I (High Sensitivity): 26 ng/L — ABNORMAL HIGH (ref <18)
Troponin I (High Sensitivity): 27 ng/L — ABNORMAL HIGH (ref <18)

## 2022-01-09 LAB — COMPREHENSIVE METABOLIC PANEL
ALT: 40 U/L (ref 0–44)
AST: 34 U/L (ref 15–41)
Albumin: 4.2 g/dL (ref 3.5–5.0)
Alkaline Phosphatase: 44 U/L (ref 38–126)
Anion gap: 8 (ref 5–15)
BUN: 25 mg/dL — ABNORMAL HIGH (ref 8–23)
CO2: 25 mmol/L (ref 22–32)
Calcium: 10 mg/dL (ref 8.9–10.3)
Chloride: 102 mmol/L (ref 98–111)
Creatinine, Ser: 0.97 mg/dL (ref 0.61–1.24)
GFR, Estimated: >60 mL/min (ref >60)
Glucose, Bld: 136 mg/dL — ABNORMAL HIGH (ref 70–99)
Potassium: 3.7 mmol/L (ref 3.5–5.1)
Sodium: 135 mmol/L (ref 135–145)
Total Bilirubin: 1.1 mg/dL (ref 0.3–1.2)
Total Protein: 7.5 g/dL (ref 6.5–8.1)

## 2022-01-09 NOTE — ED Triage Notes (Signed)
Reports right arm numbness/tingling since last night but now experiencing soreness. Also states dizziness when going to bed last night but has subsided. Reports intermittent numbness/tingling for months. Reports last night it kept waking him up from his sleep and still present which is what brought him to the ER. Full function of arm. 168/73 blood pressure by EMS. A&O x4 uppon arrival.  ?

## 2022-01-09 NOTE — ED Notes (Signed)
Pt provided with cup of ice water and graham crackers.  ?

## 2022-01-09 NOTE — ED Provider Notes (Signed)
? ?[facility] ?Provider Note ? ? ? Event Date/Time  ? First MD Initiated Contact with Patient 01/09/22 1113   ?  (approximate) ? ? ?History  ? ?Extremity Weakness (right) ? ? ?HPI ? ?Ernest James. is a 86 y.o. male with a past medical history of GERD, HTN, HDL, thyroid disease, DM and some chronic paresthesias in his right upper extremity he states his been bothering him for months almost always experienced at night which he attributes to possibly sleeping on his right upper extremity outstretched over his head.  He states that last night he felt a little dizzy when he was getting up to the bathroom to urinate which he often does and had the same tingling in his right arm that he usually does.  States he called his doctor to get a follow-up visit for this but was advised to come to emergency room to rule out stroke.  He denies any current dizziness pain or numbness in his right upper extremity.  He is currently asymptomatic.  No recent chest pain, cough, fevers, chills, nausea, vomiting, diarrhea, rash or any other weakness numbness tingling or other acute complaints.  Tobacco abuse or EtOH use.  No recent medication changes.  He has no other acute concerns at this time. ? ?  ? ? ?Physical Exam  ?Triage Vital Signs: ?ED Triage Vitals  ?Enc Vitals Group  ?   BP 01/09/22 1051 (!) 169/70  ?   Pulse Rate 01/09/22 1051 64  ?   Resp 01/09/22 1051 16  ?   Temp 01/09/22 1051 98.1 ?F (36.7 ?C)  ?   Temp Source 01/09/22 1051 Oral  ?   SpO2 01/09/22 1051 93 %  ?   Weight 01/09/22 1052 200 lb (90.7 kg)  ?   Height 01/09/22 1052 '5\' 9"'$  (1.753 m)  ?   Head Circumference --   ?   Peak Flow --   ?   Pain Score 01/09/22 1052 3  ?   Pain Loc --   ?   Pain Edu? --   ?   Excl. in [place]? --   ? ? ?Most recent vital signs: ?Vitals:  ? 01/09/22 1330 01/09/22 1400  ?BP: (!) 184/82 (!) 163/72  ?Pulse: 75 66  ?Resp: 18 14  ?Temp:    ?SpO2: 99% 99%  ? ? ?General: Awake, no distress.  ?CV:  Good peripheral  perfusion.  2+ radial pulses. ?Resp:  Normal effort.  Clear bilaterally. ?Abd:  No distention.  Soft. ?Other:  Cranial nerves II through XII grossly intact.  No pronator drift.  No finger dysmetria.  Symmetric 5/5 strength of all extremities.  Sensation intact to light touch in all extremities.  She has full range of motion of his neck without any pain.  The right upper extremity sensation is specifically intact in the distribution of the radial ulnar and median axillary nerves.  No pain or numbness on ranging of motion. ? ? ? ?ED Results / Procedures / Treatments  ?Labs ?(all labs ordered are listed, but only abnormal results are displayed) ?Labs Reviewed  ?COMPREHENSIVE METABOLIC PANEL - Abnormal; Notable for the following components:  ?    Result Value  ? Glucose, Bld 136 (*)   ? BUN 25 (*)   ? All other components within normal limits  ?TROPONIN I (HIGH SENSITIVITY) - Abnormal; Notable for the following components:  ? Troponin I (High Sensitivity) 26 (*)   ? All other components within normal  limits  ?TROPONIN I (HIGH SENSITIVITY) - Abnormal; Notable for the following components:  ? Troponin I (High Sensitivity) 27 (*)   ? All other components within normal limits  ?CBC WITH DIFFERENTIAL/PLATELET  ?TROPONIN I (HIGH SENSITIVITY)  ? ? ? ?EKG ? ?EKG is remarkable for sinus rhythm with a ventricular rate of 67, PR interval of 233 otherwise some nonspecific ST changes noted in lead II without any other clear evidence of acute ischemia or significant arrhythmia. ? ? ?RADIOLOGY ? ?CT head on my interpretation without evidence of acute ischemia, edema, mass effect, hemorrhage or other acute intracranial process.  I reviewed radiology interpretation and agree with their findings of atrophy with evidence of small vessel chronic ischemic changes without any other acute process. ? ?CT C-spine my interpretation shows multilevel degenerative diseases and facet disease without acute fracture or significant listhesis.  I  reviewed radiologist interpretation and agree to findings of encroachment upon right cervical normal foramen C4 and C5 and C5 and C6 by some costovertebral hypertrophy as well as stenosis on the left at C3 and C4. ? ? ?PROCEDURES: ? ?Critical Care performed: No ? ?.1-3 Lead EKG Interpretation ?Performed by: Lucrezia Starch, MD ?Authorized by: Lucrezia Starch, MD  ? ?  Interpretation: normal   ?  ECG rate assessment: normal   ?  Rhythm: sinus rhythm   ?  Ectopy: none   ?  Conduction: abnormal   ?  Abnormal conduction: 1st degree AV block   ? ?The patient is on the cardiac monitor to evaluate for evidence of arrhythmia and/or significant heart rate changes. ? ? ?MEDICATIONS ORDERED IN ED: ?Medications - No data to display ? ? ?IMPRESSION / MDM / ASSESSMENT AND PLAN / ED COURSE  ?I reviewed the triage vital signs and the nursing notes. ?             ?               ? ?The right patient's paresthesias in his right arm and associate with some intermittent soreness seems to be a fairly chronic issue for the patient and he is currently asymptomatic.  His history is concerning for neuropathy possibly cervical or related to position the sleep center as it seems to happen occur at night when he is sleeping on his belly with his arm over his head.  Unclear if this is related at all to the dizziness he had last night.  He is not currently dizzy denies any other associated sick symptoms.  He has a completely nonfocal neurological exam at this time and is not suggestive of a CVA.  In addition other than the tingling which is a chronic symptom for the patient he does not report any history of any new neurological deficits to suggest a TIA. ? ?Patient differential considerations for his dizziness include arrhythmia, orthostatic, vasovagal, anemia, metabolic derangements and possible symptomatic carotid stenosis. ? ?EKG is remarkable for sinus rhythm with a ventricular rate of 67, PR interval of 233 otherwise some nonspecific ST  changes noted in lead II without any other clear evidence of acute ischemia or significant arrhythmia.  Troponin slightly elevated but stable at 26 and 27.  Patient denies any chest pain on several reassessments and I have a very low suspicion for an occlusion MI.  I suspect some very mild demand ischemia in the setting of possibly poorly controlled blood pressure.  Over his systolic blood pressure is less than 180 at this time I do not  believe he requires IV emergent antihypertensives as he is otherwise completely asymptomatic. ? ?CT head on my interpretation without evidence of acute ischemia, edema, mass effect, hemorrhage or other acute intracranial process.  I reviewed radiology interpretation and agree with their findings of atrophy with evidence of small vessel chronic ischemic changes without any other acute process. ? ?CT C-spine my interpretation shows multilevel degenerative diseases and facet disease without acute fracture or significant listhesis.  I reviewed radiologist interpretation and agree to findings of encroachment upon right cervical normal foramen C4 and C5 and C5 and C6 by some costovertebral hypertrophy as well as stenosis on the left at C3 and C4. ? ?Unclear etiology for patient's dizziness last night although given he has not had any dizziness today and is not ataxic otherwise reassuring exam work-up I think is stable discharge continued outpatient evaluation.  Discussed concern for possible cervical radiculopathy causing his chronic nighttime paresthesias in his right arm with given low suspicion for CVA or TIA at this time I think he is stable for discharge continue outpatient evaluation with regard to this as well.  Patient family at bedside with no additional questions at this time.  Discussed returning for any new or worsening of symptoms.  Advised patient to take all blood pressure medications as advised.  Discharged in stable condition.  Strict precautions advised and discussed. ? ?   ? ? ?FINAL CLINICAL IMPRESSION(S) / ED DIAGNOSES  ? ?Final diagnoses:  ?Paresthesia  ?Hypertension, unspecified type  ?Dizziness  ?Cervical radiculopathy  ? ? ? ?Rx / DC Orders  ? ?ED Discharge Orders   ? ?

## 2022-01-09 NOTE — ED Notes (Signed)
AVS provided to and discussed with patient and family member at bedside. Pt verbalizes understanding of discharge instructions and denies any questions or concerns at this time. Pt has ride home. Pt ambulated out of department independently with steady gait.  

## 2022-01-09 NOTE — Telephone Encounter (Signed)
Copied from [place] [id number]. Topic: Appointment Scheduling - Scheduling Inquiry for Clinic ?>> Jan 09, 2022  3:24 PM Erick Blinks wrote: ?Reason for CRM: Pt's wife called requesting a hosp fu for the patient within 5 days if possible. She declined the next available. ?

## 2022-01-09 NOTE — ED Notes (Signed)
Attempted X 2 for PIV/blood draw without success. EMT-P to attempt.  ?

## 2022-01-09 NOTE — Telephone Encounter (Signed)
?  Chief Complaint: right arm numbness ?Symptoms: dizziness that started when he went to bed, out of balance when walking ?Frequency: h/o arm numbness that has gotten worse ?Pertinent Negatives: Patient denies slurred speech, chest pain, vision issues, headache, numbness to the face, arms or legs ?Disposition: '[x]'$ ED /'[]'$ Urgent Care (no appt availability in office) / '[]'$ Appointment(In office/virtual)/ '[]'$  [facility]/ '[]'$ Home Care/ '[]'$ Refused Recommended Disposition /'[]'$ [facility]/ '[]'$  Follow-up with PCP ?Additional Notes: called 911 for pt has a h/o TIA ? ? ? ?Reason for Disposition ? [1] Numbness (i.e., loss of sensation) of the face, arm / hand, or leg / foot on one side of the body AND [2] gradual onset (e.g., days to weeks) AND [3] present now ? ?Answer Assessment - Initial Assessment Questions ?1. SYMPTOM: "What is the main symptom you are concerned about?" (e.g., weakness, numbness) ?    Right arm numbness  ?2. ONSET: "When did this start?" (minutes, hours, days; while sleeping) ?    Within this week ?3. LAST NORMAL: "When was the last time you (the patient) were normal (no symptoms)?" ?    H/o numbness ( ?4. PATTERN "Does this come and go, or has it been constant since it started?"  "Is it present now?" ?    Constant  ?5. CARDIAC SYMPTOMS: "Have you had any of the following symptoms: chest pain, difficulty breathing, palpitations?" ?    N/o ?6. NEUROLOGIC SYMPTOMS: "Have you had any of the following symptoms: headache, dizziness, vision loss, double vision, changes in speech, unsteady on your feet?" ?    Dizziness last night ?7. OTHER SYMPTOMS: "Do you have any other symptoms?" ?    no ?8. PREGNANCY: "Is there any chance you are pregnant?" "When was your last menstrual period?" ?    N/a ? ?Protocols used: Neurologic Deficit-A-AH ? ?

## 2022-01-14 ENCOUNTER — Encounter: Payer: Self-pay | Admitting: Family Medicine

## 2022-01-14 ENCOUNTER — Ambulatory Visit (INDEPENDENT_AMBULATORY_CARE_PROVIDER_SITE_OTHER): Payer: Medicare Other | Admitting: Family Medicine

## 2022-01-14 VITALS — BP 136/74 | HR 57 | Temp 98.0°F | Resp 16 | Wt 199.6 lb

## 2022-01-14 DIAGNOSIS — R778 Other specified abnormalities of plasma proteins: Secondary | ICD-10-CM | POA: Diagnosis not present

## 2022-01-14 DIAGNOSIS — E119 Type 2 diabetes mellitus without complications: Secondary | ICD-10-CM | POA: Diagnosis not present

## 2022-01-14 DIAGNOSIS — M5412 Radiculopathy, cervical region: Secondary | ICD-10-CM | POA: Diagnosis not present

## 2022-01-14 DIAGNOSIS — I10 Essential (primary) hypertension: Secondary | ICD-10-CM | POA: Diagnosis not present

## 2022-01-14 DIAGNOSIS — E039 Hypothyroidism, unspecified: Secondary | ICD-10-CM

## 2022-01-14 DIAGNOSIS — E782 Mixed hyperlipidemia: Secondary | ICD-10-CM

## 2022-01-14 DIAGNOSIS — Z125 Encounter for screening for malignant neoplasm of prostate: Secondary | ICD-10-CM

## 2022-01-14 DIAGNOSIS — G4700 Insomnia, unspecified: Secondary | ICD-10-CM | POA: Diagnosis not present

## 2022-01-14 MED ORDER — MELOXICAM 7.5 MG PO TABS: 7.5000 mg | ORAL_TABLET | Freq: Every day | ORAL | 0 refills | Status: DC

## 2022-01-14 MED ORDER — AMLODIPINE BESYLATE 2.5 MG PO TABS: 2.5000 mg | ORAL_TABLET | Freq: Every day | ORAL | 1 refills | Status: DC

## 2022-01-14 NOTE — Progress Notes (Signed)
?  ? ?I,Jana Robinson,acting as a scribe for Lelon Huh, MD.,have documented all relevant documentation on the behalf of Lelon Huh, MD,as directed by  Lelon Huh, MD while in the presence of Lelon Huh, MD.  ? ?Established patient visit ? ? ?Patient: [facility] Ernest James.   DOB: Apr 23, 1936   86 y.o. Male  MRN: [id number] ?Visit Date: 01/14/2022 ? ?Today's healthcare provider: Lelon Huh, MD  ? ? ?Subjective  ?  ?HPI ? ?Follow up ER ? ?Patient was seen at ER on 01-09-2022 ?He was treated for extremity weakness / numbness right upper extremity and dizziness. Numbness had been going on off and on for a few months, but had gotten worse when he called office to arrange routine follow up. He was advised by nurse on phone to go to ER. Patient sleeps on right side and wakes with numbness.  The dizziness has resolved.  ? ?Head CT remarkable for mild small vessel chronic ischemic changes of deep cerebral white matter and labs remarkable only for slightly elevated . Troponin. He did have diffuse DDD of on CT of cervical spine with encroachment on C5-6 and C6-7 ?Telephone follow up was done on 01-09-22 ?He reports excellent compliance with treatment. Follow up with PCP. ?He reports this condition is stayed the same. Numbness woke patient last night but goes away when he gets up and starts moving.  ? ?He is noted to have bilateral carotid artery disease followed by Dr. Delana Meyer and has previously scheduled followed up appointment on 01/30/2022 ? ?He also reports he has had more trouble getting to sleep and sleeping through the night. Usually has to get a few times to urinate and has difficulty getting back to sleep ? ?He is also due for follow up diabetes, lipids, hypertension and hypothyroid.  ?----------------------------------------------------------------------------------------- -  ? ?Medications: ?Outpatient Medications Prior to Visit  ?Medication Sig  ? aspirin EC 81 MG tablet Take 81 mg by mouth daily.   ? atorvastatin (LIPITOR) 40 MG tablet Take 1 tablet by mouth once daily  ? Cholecalciferol (VITAMIN D3) 20 MCG TABS Take 1 tablet by mouth daily.   ? COVID-19 mRNA Vac-TriS, Pfizer, (PFIZER-BIONT COVID-19 VAC-TRIS) SUSP injection Inject into the muscle.  ? Cyanocobalamin (VITAMIN B12) 1000 MCG TBCR Take 1 tablet by mouth daily.  ? EUTHYROX 75 MCG tablet TAKE 1 TABLET BY MOUTH ONCE DAILY BEFORE BREAKFAST  ? glucose blood (ONETOUCH VERIO) test strip Test TID. Use as instructed  ? hydrocortisone 2.5 % cream Apply topically.  ? ketoconazole (NIZORAL) 2 % shampoo Apply 1 application topically 2 (two) times a week.  ? Lancets (ONETOUCH ULTRASOFT) lancets Test TID as instructed  ? losartan (COZAAR) 50 MG tablet Take 1 tablet by mouth once daily  ? metFORMIN (GLUCOPHAGE-XR) 500 MG 24 hr tablet Take 1 tablet by mouth once daily with breakfast  ? Multiple Vitamins-Minerals (MULTIVITAMIN ADULT PO) Take 1 tablet by mouth daily.   ? nystatin cream (MYCOSTATIN) Apply topically 2 (two) times daily.  ? omeprazole (PRILOSEC) 20 MG capsule Take 20 mg by mouth daily. PRN  ? tamsulosin (FLOMAX) 0.4 MG CAPS capsule Take 1 capsule (0.4 mg total) by mouth 2 (two) times daily.  ? triamcinolone cream (KENALOG) 0.1 % APPLY TO AFFECTED AREAS TWICE DAILY UNTIL IMPROVED THEN DISCONTINUE  ? triamcinolone ointment (KENALOG) 0.1 % Apply topically 2 (two) times daily.  ? triamterene-hydrochlorothiazide (MAXZIDE) 75-50 MG tablet Take 1 tablet by mouth once daily  ? ?No facility-administered medications prior to visit.  ? ? ?  Review of Systems ? ? ?  Objective  ?  ?BP 136/74 (BP Location: Right Arm, Patient Position: Sitting, Cuff Size: Normal)   Pulse (!) 57   Temp 98 ?F (36.7 ?C) (Oral)   Resp 16   Wt 199 lb 9.6 oz (90.5 kg)   SpO2 96%   BMI 29.48 kg/m?  ? ? ?Physical Exam  ? ?General: Appearance:     ?Well developed, well nourished male in no acute distress  ?Eyes:    PERRL, conjunctiva/corneas clear, EOM's intact       ?Lungs:     Clear to  auscultation bilaterally, respirations unlabored  ?Heart:    Bradycardic. Normal rhythm. No murmurs, rubs, or gallops.    ?MS:   All extremities are intact.    ?Neurologic:   Awake, alert, oriented x 3. No apparent focal neurological defect.   ?   ?   ? Assessment & Plan  ?  ? ?1. Cervical radiculopathy ? ?- meloxicam (MOBIC) 7.5 MG tablet; Take 1 tablet (7.5 mg total) by mouth daily.  Dispense: 30 tablet; Refill: 0 ? ?2. Benign essential HTN ?SBP uncontrolled. add amLODipine (NORVASC) 2.5 MG tablet; Take 1 tablet (2.5 mg total) by mouth daily.  Dispense: 30 tablet; Refill: 1 ? ?3. Elevated troponin ?Incidental finding during work up dizziness and recent ER visit. He is not having chest pains, but does have multiple cardiac risk factors.  ?- Troponin T ? ?Consider cardiology referral after reviewing labs.  ? ?4. Diabetes mellitus without complication ([place]) ? ?- Hemoglobin A1c ? ?5. Insomnia, unspecified type ?Exacerbated by nocturia. See how PSA looks below.  ? ?6. Hypothyroidism, unspecified type ? ?- T4, free ?- TSH ? ?7. Mixed hyperlipidemia ? ?- Lipid Profile ? ?8. Prostate cancer screening ? ?- PSA Total (Reflex To Free)  ?   ? ?The entirety of the information documented in the History of Present Illness, Review of Systems and Physical Exam were personally obtained by me. Portions of this information were initially documented by the CMA and reviewed by me for thoroughness and accuracy.   ? ? ?Lelon Huh, MD  ?[facility] ?[phone] (phone) ?[phone] (fax) ? ?[facility] ?

## 2022-01-14 NOTE — Patient Instructions (Signed)
.   Please review the attached list of medications and notify my office if there are any errors.   . Please bring all of your medications to every appointment so we can make sure that our medication list is the same as yours.   

## 2022-01-15 ENCOUNTER — Other Ambulatory Visit: Payer: Self-pay | Admitting: Family Medicine

## 2022-01-15 DIAGNOSIS — I6523 Occlusion and stenosis of bilateral carotid arteries: Secondary | ICD-10-CM

## 2022-01-15 DIAGNOSIS — R778 Other specified abnormalities of plasma proteins: Secondary | ICD-10-CM

## 2022-01-15 DIAGNOSIS — E782 Mixed hyperlipidemia: Secondary | ICD-10-CM

## 2022-01-15 LAB — LIPID PANEL
Chol/HDL Ratio: 2.6 ratio (ref 0.0–5.0)
Cholesterol, Total: 115 mg/dL (ref 100–199)
HDL: 45 mg/dL (ref >39)
LDL Chol Calc (NIH): 37 mg/dL (ref 0–99)
Triglycerides: 212 mg/dL — ABNORMAL HIGH (ref 0–149)
VLDL Cholesterol Cal: 33 mg/dL (ref 5–40)

## 2022-01-15 LAB — PSA TOTAL (REFLEX TO FREE): Prostate Specific Ag, Serum: 1.7 ng/mL (ref 0.0–4.0)

## 2022-01-15 LAB — TROPONIN T: Troponin T (Highly Sensitive): 28 ng/L (ref 0–22)

## 2022-01-15 LAB — TSH: TSH: 8.6 u[IU]/mL — ABNORMAL HIGH (ref 0.450–4.500)

## 2022-01-15 LAB — T4, FREE: Free T4: 1.11 ng/dL (ref 0.82–1.77)

## 2022-01-15 LAB — HEMOGLOBIN A1C
Est. average glucose Bld gHb Est-mCnc: 146 mg/dL
Hgb A1c MFr Bld: 6.7 % — ABNORMAL HIGH (ref 4.8–5.6)

## 2022-01-28 ENCOUNTER — Other Ambulatory Visit (INDEPENDENT_AMBULATORY_CARE_PROVIDER_SITE_OTHER): Payer: Self-pay | Admitting: Vascular Surgery

## 2022-01-28 DIAGNOSIS — I6523 Occlusion and stenosis of bilateral carotid arteries: Secondary | ICD-10-CM

## 2022-01-28 NOTE — Progress Notes (Unsigned)
MRN : [id number]  [facility] Ernest Theil. is a 86 y.o. (1935/10/16) male who presents with chief complaint of check carotid arteries.  History of Present Illness:   The patient is seen for follow up evaluation of carotid stenosis. The carotid stenosis followed by ultrasound.    The patient denies amaurosis fugax. There is no recent history of TIA symptoms or focal motor deficits. There is no prior documented CVA.   The patient is taking enteric-coated aspirin 81 mg daily.   There is no history of migraine headaches. There is no history of seizures.   The patient has a history of coronary artery disease, no recent episodes of angina or shortness of breath. The patient denies PAD or claudication symptoms. There is a history of hyperlipidemia which is being treated with a statin.     Carotid duplex today shows 78-29% RICA and >56% LICA   No outpatient medications have been marked as taking for the 01/29/22 encounter (Appointment) with Delana Meyer, Dolores Lory, MD.    Past Medical History:  Diagnosis Date   Arthritis    GERD (gastroesophageal reflux disease)    Hypertension    Phimosis    Pre-diabetes     Past Surgical History:  Procedure Laterality Date   CIRCUMCISION     COLONOSCOPY WITH PROPOFOL N/A 03/31/2016   Procedure: COLONOSCOPY WITH PROPOFOL;  Surgeon: Lollie Sails, MD;  Location: [facility] ENDOSCOPY;  Service: Endoscopy;  Laterality: N/A;   MRI     SINUS SURGERY  1994   on left side   TONSILLECTOMY      Social History Social History   Tobacco Use   Smoking status: Never   Smokeless tobacco: Never  Vaping Use   Vaping Use: Never used  Substance Use Topics   Alcohol use: No    Alcohol/week: 0.0 standard drinks   Drug use: No    Family History Family History  Problem Relation Age of Onset   Alzheimer's disease Mother    CAD Father    Diabetes Sister        type 2    No Known Allergies   REVIEW OF SYSTEMS (Negative unless  checked)  Constitutional: '[]'$ Weight loss  '[]'$ Fever  '[]'$ Chills Cardiac: '[]'$ Chest pain   '[]'$ Chest pressure   '[]'$ Palpitations   '[]'$ Shortness of breath when laying flat   '[]'$ Shortness of breath with exertion. Vascular:  '[x]'$ Pain in legs with walking   '[]'$ Pain in legs at rest  '[]'$ History of DVT   '[]'$ Phlebitis   '[]'$ Swelling in legs   '[]'$ Varicose veins   '[]'$ Non-healing ulcers Pulmonary:   '[]'$ Uses home oxygen   '[]'$ Productive cough   '[]'$ Hemoptysis   '[]'$ Wheeze  '[]'$ COPD   '[]'$ Asthma Neurologic:  '[]'$ Dizziness   '[]'$ Seizures   '[]'$ History of stroke   '[]'$ History of TIA  '[]'$ Aphasia   '[]'$ Vissual changes   '[x]'$ Weakness or numbness in arm   '[]'$ Weakness or numbness in leg Musculoskeletal:   '[]'$ Joint swelling   '[]'$ Joint pain   '[]'$ Low back pain Hematologic:  '[]'$ Easy bruising  '[]'$ Easy bleeding   '[]'$ Hypercoagulable state   '[]'$ Anemic Gastrointestinal:  '[]'$ Diarrhea   '[]'$ Vomiting  '[x]'$ Gastroesophageal reflux/heartburn   '[]'$ Difficulty swallowing. Genitourinary:  '[]'$ Chronic kidney disease   '[]'$ Difficult urination  '[]'$ Frequent urination   '[]'$ Blood in urine Skin:  '[]'$ Rashes   '[]'$ Ulcers  Psychological:  '[]'$ History of anxiety   '[]'$  History of major depression.  Physical Examination  There were no vitals filed for this visit. There is no height or weight on file to calculate BMI.  Gen: WD/WN, NAD Head: [place]/AT, No temporalis wasting.  Ear/Nose/Throat: Hearing grossly intact, nares w/o erythema or drainage Eyes: PER, EOMI, sclera nonicteric.  Neck: Supple, no masses.  No bruit or JVD.  Pulmonary:  Good air movement, no audible wheezing, no use of accessory muscles.  Cardiac: RRR, normal S1, S2, no Murmurs. Vascular:  carotid bruit noted Vessel Right Left  Radial Palpable Palpable  Carotid  Palpable  Palpable  Gastrointestinal: soft, non-distended. No guarding/no peritoneal signs.  Musculoskeletal: M/S 5/5 throughout.  No visible deformity.  Neurologic: CN 2-12 intact. Pain and light touch intact in extremities.  Symmetrical.  Speech is fluent. Motor exam as listed  above. Psychiatric: Judgment intact, Mood & affect appropriate for pt's clinical situation. Dermatologic: No rashes or ulcers noted.  No changes consistent with cellulitis.   CBC Lab Results  Component Value Date   WBC 8.0 01/09/2022   HGB 14.5 01/09/2022   HCT 43.0 01/09/2022   MCV 91.3 01/09/2022   PLT 276 01/09/2022    BMET    Component Value Date/Time   NA 135 01/09/2022 1058   NA 140 01/07/2021 1417   K 3.7 01/09/2022 1058   CL 102 01/09/2022 1058   CO2 25 01/09/2022 1058   GLUCOSE 136 (H) 01/09/2022 1058   BUN 25 (H) 01/09/2022 1058   BUN 19 01/07/2021 1417   CREATININE 0.97 01/09/2022 1058   CALCIUM 10.0 01/09/2022 1058   GFRNONAA >60 01/09/2022 1058   GFRAA >60 12/23/2019 1352   CrCl cannot be calculated (Unknown ideal weight.).  COAG No results found for: INR, PROTIME  Radiology CT HEAD WO CONTRAST (5MM)  Result Date: 01/09/2022 CLINICAL DATA:  Cervical radiculopathy, RIGHT arm numbness and tingling since last night, dizziness, history hypertension EXAM: CT HEAD WITHOUT CONTRAST CT CERVICAL SPINE WITHOUT CONTRAST TECHNIQUE: Multidetector CT imaging of the head and cervical spine was performed following the standard protocol without intravenous contrast. Multiplanar CT image reconstructions of the cervical spine were also generated. RADIATION DOSE REDUCTION: This exam was performed according to the departmental dose-optimization program which includes automated exposure control, adjustment of the mA and/or kV according to patient size and/or use of iterative reconstruction technique. COMPARISON:  CT head 12/23/2019 FINDINGS: CT HEAD FINDINGS Brain: Generalized atrophy. Normal ventricular morphology. No midline shift or mass effect. Mild small vessel chronic ischemic changes of deep cerebral white matter. No intracranial hemorrhage, mass lesion, or evidence of acute infarction. No extra-axial fluid collections. Vascular: Atherosclerotic calcification of internal carotid  and vertebral arteries at skull base Skull: Intact Sinuses/Orbits: Clear Other: N/A CT CERVICAL SPINE FINDINGS Alignment: Normal Skull base and vertebrae: Mild osseous demineralization. Visualized skull base intact. Vertebral body heights maintained. Multilevel disc space narrowing and endplate spur formation. Multilevel facet degenerative changes. No fracture, subluxation, or bone destruction. Encroachment upon RIGHT cervical neural foramina C4-C5 and C5-C6 by uncovertebral hypertrophy. Additional neural foraminal stenosis LEFT C3-C4 by uncovertebral and facet hypertrophy. Lesser degrees of neural foraminal narrowing at multiple additional levels. Soft tissues and spinal canal: Prevertebral soft tissues normal thickness. Mild atherosclerotic calcification at the carotid bifurcations and proximal great vessels. Disc levels:  No definite disc herniation Upper chest: Lung apices clear Other: N/A IMPRESSION: Atrophy with small vessel chronic ischemic changes of deep cerebral white matter. No acute intracranial abnormalities. Multilevel degenerative disc and facet disease changes of the cervical spine. No acute cervical spine abnormalities. Electronically Signed   By: Lavonia Dana M.D.   On: 01/09/2022 12:03   CT Cervical Spine Wo Contrast  Result Date: 01/09/2022 CLINICAL DATA:  Cervical radiculopathy, RIGHT arm numbness and tingling since last night, dizziness, history hypertension EXAM: CT HEAD WITHOUT CONTRAST CT CERVICAL SPINE WITHOUT CONTRAST TECHNIQUE: Multidetector CT imaging of the head and cervical spine was performed following the standard protocol without intravenous contrast. Multiplanar CT image reconstructions of the cervical spine were also generated. RADIATION DOSE REDUCTION: This exam was performed according to the departmental dose-optimization program which includes automated exposure control, adjustment of the mA and/or kV according to patient size and/or use of iterative reconstruction technique.  COMPARISON:  CT head 12/23/2019 FINDINGS: CT HEAD FINDINGS Brain: Generalized atrophy. Normal ventricular morphology. No midline shift or mass effect. Mild small vessel chronic ischemic changes of deep cerebral white matter. No intracranial hemorrhage, mass lesion, or evidence of acute infarction. No extra-axial fluid collections. Vascular: Atherosclerotic calcification of internal carotid and vertebral arteries at skull base Skull: Intact Sinuses/Orbits: Clear Other: N/A CT CERVICAL SPINE FINDINGS Alignment: Normal Skull base and vertebrae: Mild osseous demineralization. Visualized skull base intact. Vertebral body heights maintained. Multilevel disc space narrowing and endplate spur formation. Multilevel facet degenerative changes. No fracture, subluxation, or bone destruction. Encroachment upon RIGHT cervical neural foramina C4-C5 and C5-C6 by uncovertebral hypertrophy. Additional neural foraminal stenosis LEFT C3-C4 by uncovertebral and facet hypertrophy. Lesser degrees of neural foraminal narrowing at multiple additional levels. Soft tissues and spinal canal: Prevertebral soft tissues normal thickness. Mild atherosclerotic calcification at the carotid bifurcations and proximal great vessels. Disc levels:  No definite disc herniation Upper chest: Lung apices clear Other: N/A IMPRESSION: Atrophy with small vessel chronic ischemic changes of deep cerebral white matter. No acute intracranial abnormalities. Multilevel degenerative disc and facet disease changes of the cervical spine. No acute cervical spine abnormalities. Electronically Signed   By: Lavonia Dana M.D.   On: 01/09/2022 12:03     Assessment/Plan 1. Bilateral carotid artery stenosis The patient remains asymptomatic with respect to the carotid stenosis.  However, the patient has now progressed and has a lesion the is >70%.  Patient should undergo CT angiography of the carotid arteries to define the degree of stenosis of the internal carotid  arteries bilaterally and the anatomic suitability for surgery vs. intervention.  If the patient does indeed need surgery cardiac clearance will be required, once cleared the patient will be scheduled for surgery.  The risks, benefits and alternative therapies were reviewed in detail with the patient.  All questions were answered.  The patient agrees to proceed with imaging.  Continue antiplatelet therapy as prescribed. Continue management of CAD, HTN and Hyperlipidemia. Healthy heart diet, encouraged exercise at least 4 times per week.    - CT ANGIO NECK W OR WO CONTRAST; Future  2. Benign essential HTN Continue antihypertensive medications as already ordered, these medications have been reviewed and there are no changes at this time.   3. Gastroesophageal reflux disease with esophagitis, unspecified whether hemorrhage Continue PPI as already ordered, this medication has been reviewed and there are no changes at this time.  Avoidence of caffeine and alcohol  Moderate elevation of the head of the bed    4. Diabetes mellitus without complication ([place]) Continue hypoglycemic medications as already ordered, these medications have been reviewed and there are no changes at this time.  Hgb A1C to be monitored as already arranged by primary service   5. Mixed hyperlipidemia Continue statin as ordered and reviewed, no changes at this time     [name], MD  01/28/2022 8:53 PM

## 2022-01-29 ENCOUNTER — Encounter (INDEPENDENT_AMBULATORY_CARE_PROVIDER_SITE_OTHER): Payer: Self-pay | Admitting: Vascular Surgery

## 2022-01-29 ENCOUNTER — Ambulatory Visit (INDEPENDENT_AMBULATORY_CARE_PROVIDER_SITE_OTHER): Payer: Medicare Other | Admitting: Vascular Surgery

## 2022-01-29 ENCOUNTER — Ambulatory Visit (INDEPENDENT_AMBULATORY_CARE_PROVIDER_SITE_OTHER): Payer: Medicare Other

## 2022-01-29 VITALS — BP 122/69 | HR 80 | Resp 16 | Wt 199.6 lb

## 2022-01-29 DIAGNOSIS — E782 Mixed hyperlipidemia: Secondary | ICD-10-CM

## 2022-01-29 DIAGNOSIS — K2100 Gastro-esophageal reflux disease with esophagitis, without bleeding: Secondary | ICD-10-CM | POA: Diagnosis not present

## 2022-01-29 DIAGNOSIS — E119 Type 2 diabetes mellitus without complications: Secondary | ICD-10-CM

## 2022-01-29 DIAGNOSIS — I6523 Occlusion and stenosis of bilateral carotid arteries: Secondary | ICD-10-CM

## 2022-01-29 DIAGNOSIS — I10 Essential (primary) hypertension: Secondary | ICD-10-CM

## 2022-02-03 ENCOUNTER — Telehealth (INDEPENDENT_AMBULATORY_CARE_PROVIDER_SITE_OTHER): Payer: Self-pay

## 2022-02-03 DIAGNOSIS — D0421 Carcinoma in situ of skin of right ear and external auricular canal: Secondary | ICD-10-CM | POA: Diagnosis not present

## 2022-02-03 NOTE — Telephone Encounter (Signed)
Patient left a message checking the status for CT referral. I left a detailed message on patient voicemail with radiology number to schedule appointment.Also informed patient to contact office to schedule appointment with Dr Delana Meyer.

## 2022-02-04 NOTE — Progress Notes (Addendum)
I,Roshena L Chambers,acting as a scribe for Lelon Huh, MD.,have documented all relevant documentation on the behalf of Lelon Huh, MD,as directed by  Lelon Huh, MD while in the presence of Lelon Huh, MD.   Established patient visit   Patient: Ernest James.   DOB: Dec 21, 1935   86 y.o. Male  MRN: [id number] Visit Date: 02/06/2022  Today's healthcare provider: Lelon Huh, MD   Chief Complaint  Patient presents with   Hypertension   Subjective    HPI  Hypertension, follow-up  BP Readings from Last 3 Encounters:  02/06/22 (!) 155/65  01/29/22 122/69  01/14/22 136/74   Wt Readings from Last 3 Encounters:  02/06/22 201 lb (91.2 kg)  01/29/22 199 lb 9.6 oz (90.5 kg)  01/14/22 199 lb 9.6 oz (90.5 kg)     He was last seen for hypertension on 01/14/2022.   BP at that visit was 136/74. Management since that visit includes adding amLODipine (NORVASC) 2.5 MG tablet; Take 1 tablet (2.5 mg total) by mouth daily.  He reports poor compliance with treatment. Patient was taking amlodipine until he was seen by his Vascular specialist Dr. Delana Meyer on 01/29/2022 and found that that left carotid stenosis had advanced to 80-99 by ultrasound . Patient states his blood pressure was 122/69 at that visit. Patient was concerned that his blood pressure was to low, so he stopped taking amlodipine a few days ago. He is scheduled for CTA of neck next week.  He is not having side effects.  He is following a Regular diet. He is not exercising. He does not smoke.  Use of agents associated with hypertension: NSAIDS and thyroid hormones.   Outside blood pressures are 140/80's. Symptoms: No chest pain No chest pressure  No palpitations No syncope  No dyspnea No orthopnea  No paroxysmal nocturnal dyspnea No lower extremity edema   Pertinent labs Lab Results  Component Value Date   CHOL 115 01/14/2022   HDL 45 01/14/2022   LDLCALC 37 01/14/2022   TRIG 212 (H)  01/14/2022   CHOLHDL 2.6 01/14/2022   Lab Results  Component Value Date   NA 135 01/09/2022   K 3.7 01/09/2022   CREATININE 0.97 01/09/2022   GFRNONAA >60 01/09/2022   GLUCOSE 136 (H) 01/09/2022   TSH 8.600 (H) 01/14/2022     The ASCVD Risk score (Arnett DK, et al., 2019) failed to calculate for the following reasons:   The 2019 ASCVD risk score is only valid for ages 69 to 67  ---------------------------------------------------------------------------------------------------  He was also prescribed 7.'5mg'$  meloxicam for cervical radiculopathy at his last visit in May and states that has been very effective. He wishes to continue medication.   Medications: Outpatient Medications Prior to Visit  Medication Sig   amLODipine (NORVASC) 2.5 MG tablet Take 1 tablet (2.5 mg total) by mouth daily.   aspirin EC 81 MG tablet Take 81 mg by mouth daily.   atorvastatin (LIPITOR) 40 MG tablet Take 1 tablet by mouth once daily   Cholecalciferol (VITAMIN D3) 20 MCG TABS Take 1 tablet by mouth daily.    Cyanocobalamin (VITAMIN B12) 1000 MCG TBCR Take 1 tablet by mouth daily.   EUTHYROX 75 MCG tablet TAKE 1 TABLET BY MOUTH ONCE DAILY BEFORE BREAKFAST   glucose blood (ONETOUCH VERIO) test strip Test TID. Use as instructed   hydrocortisone 2.5 % cream Apply topically.   ketoconazole (NIZORAL) 2 % shampoo Apply 1 application topically 2 (two) times a week.  Lancets (ONETOUCH ULTRASOFT) lancets Test TID as instructed   losartan (COZAAR) 50 MG tablet Take 1 tablet by mouth once daily   meloxicam (MOBIC) 7.5 MG tablet Take 1 tablet (7.5 mg total) by mouth daily.   metFORMIN (GLUCOPHAGE-XR) 500 MG 24 hr tablet Take 1 tablet by mouth once daily with breakfast   Multiple Vitamins-Minerals (MULTIVITAMIN ADULT PO) Take 1 tablet by mouth daily.    nystatin cream (MYCOSTATIN) Apply topically 2 (two) times daily.   omeprazole (PRILOSEC) 20 MG capsule Take 20 mg by mouth daily. PRN   tamsulosin (FLOMAX) 0.4 MG  CAPS capsule Take 1 capsule (0.4 mg total) by mouth 2 (two) times daily.   triamcinolone cream (KENALOG) 0.1 % APPLY TO AFFECTED AREAS TWICE DAILY UNTIL IMPROVED THEN DISCONTINUE   triamterene-hydrochlorothiazide (MAXZIDE) 75-50 MG tablet Take 1 tablet by mouth once daily   No facility-administered medications prior to visit.    Review of Systems  Constitutional:  Negative for appetite change, chills and fever.  Respiratory:  Negative for chest tightness, shortness of breath and wheezing.   Cardiovascular:  Negative for chest pain and palpitations.  Gastrointestinal:  Negative for abdominal pain, nausea and vomiting.      Objective    BP (!) 155/65   Pulse 65   Temp 97.9 F (36.6 C) (Oral)   Resp 14   Wt 201 lb (91.2 kg)   SpO2 98% Comment: room air  BMI 29.68 kg/m    Physical Exam   General appearance:  Well developed, well nourished male, cooperative and in no acute distress Head: Normocephalic, without obvious abnormality, atraumatic Respiratory: Respirations even and unlabored, normal respiratory rate Extremities: All extremities are intact.  Skin: Skin color, texture, turgor normal. No rashes seen  Psych: Appropriate mood and affect. Neurologic: Mental status: Alert, oriented to person, place, and time, thought content appropriate.    Assessment & Plan     1. Primary hypertension Had dramatically improved with addition of 2.'5mg'$  amlodipine, but he has now stopped taking it due to relative low BP at his recent vascular appointment when it discovered that left carotid stenosis had advanced. Will hold off on any changes to regiment until vascular evaluation is complete. He may end up requiring vascular intervention which might improve blood pressure. We will contact patient for follow up at that time.   2. Cervical radiculopathy Much better with addition of meloxicam (MOBIC) 7.5 MG tablet; Take 1 tablet (7.5 mg total) by mouth daily as needed for pain.  Dispense: 60  tablet; Refill: 2 - Renal function panel      The entirety of the information documented in the History of Present Illness, Review of Systems and Physical Exam were personally obtained by me. Portions of this information were initially documented by the CMA and reviewed by me for thoroughness and accuracy.     Lelon Huh, MD  [facility] [phone] (phone) [phone] (fax)  [place]

## 2022-02-06 ENCOUNTER — Ambulatory Visit (INDEPENDENT_AMBULATORY_CARE_PROVIDER_SITE_OTHER): Payer: Medicare Other | Admitting: Family Medicine

## 2022-02-06 ENCOUNTER — Encounter: Payer: Self-pay | Admitting: Family Medicine

## 2022-02-06 VITALS — BP 155/65 | HR 65 | Temp 97.9°F | Resp 14 | Wt 201.0 lb

## 2022-02-06 DIAGNOSIS — I10 Essential (primary) hypertension: Secondary | ICD-10-CM

## 2022-02-06 DIAGNOSIS — I6523 Occlusion and stenosis of bilateral carotid arteries: Secondary | ICD-10-CM

## 2022-02-06 DIAGNOSIS — M5412 Radiculopathy, cervical region: Secondary | ICD-10-CM | POA: Diagnosis not present

## 2022-02-06 MED ORDER — MELOXICAM 7.5 MG PO TABS: 7.5000 mg | ORAL_TABLET | Freq: Every day | ORAL | 2 refills | Status: DC | PRN

## 2022-02-07 LAB — RENAL FUNCTION PANEL
Albumin: 4.2 g/dL (ref 3.6–4.6)
BUN/Creatinine Ratio: 15 (ref 10–24)
BUN: 15 mg/dL (ref 8–27)
CO2: 22 mmol/L (ref 20–29)
Calcium: 10.2 mg/dL (ref 8.6–10.2)
Chloride: 97 mmol/L (ref 96–106)
Creatinine, Ser: 0.98 mg/dL (ref 0.76–1.27)
Glucose: 116 mg/dL — ABNORMAL HIGH (ref 70–99)
Phosphorus: 2.8 mg/dL (ref 2.8–4.1)
Potassium: 3.9 mmol/L (ref 3.5–5.2)
Sodium: 136 mmol/L (ref 134–144)
eGFR: 77 mL/min/{1.73_m2} (ref >59)

## 2022-02-10 ENCOUNTER — Ambulatory Visit
Admission: RE | Admit: 2022-02-10 | Discharge: 2022-02-10 | Disposition: A | Discharge status: Home / Self Care | Payer: Medicare Other | Source: Ambulatory Visit | Attending: Vascular Surgery | Admitting: Vascular Surgery

## 2022-02-10 DIAGNOSIS — I6523 Occlusion and stenosis of bilateral carotid arteries: Secondary | ICD-10-CM | POA: Insufficient documentation

## 2022-02-10 DIAGNOSIS — I672 Cerebral atherosclerosis: Secondary | ICD-10-CM | POA: Diagnosis not present

## 2022-02-10 DIAGNOSIS — I6503 Occlusion and stenosis of bilateral vertebral arteries: Secondary | ICD-10-CM | POA: Diagnosis not present

## 2022-02-10 DIAGNOSIS — I771 Stricture of artery: Secondary | ICD-10-CM | POA: Diagnosis not present

## 2022-02-10 MED ORDER — IOHEXOL 350 MG/ML SOLN
75.0000 mL | Freq: Once | INTRAVENOUS | Status: AC | PRN
  Administered 2022-02-10: 75 mL via INTRAVENOUS

## 2022-02-13 ENCOUNTER — Telehealth (INDEPENDENT_AMBULATORY_CARE_PROVIDER_SITE_OTHER): Payer: Self-pay | Admitting: Vascular Surgery

## 2022-02-13 NOTE — Telephone Encounter (Signed)
Pt LVMOM to make an appt for CT results with Dr. Delana Meyer. I ATC pt back and VM was full. Unable to LVM. I will try additional #. I was able to schedule appt with pt - nothing further needed at this time.

## 2022-02-16 ENCOUNTER — Encounter (INDEPENDENT_AMBULATORY_CARE_PROVIDER_SITE_OTHER): Payer: Self-pay | Admitting: Vascular Surgery

## 2022-02-16 ENCOUNTER — Ambulatory Visit (INDEPENDENT_AMBULATORY_CARE_PROVIDER_SITE_OTHER): Payer: Medicare Other | Admitting: Vascular Surgery

## 2022-02-16 VITALS — BP 184/77 | HR 78 | Resp 16 | Wt 197.6 lb

## 2022-02-16 DIAGNOSIS — I10 Essential (primary) hypertension: Secondary | ICD-10-CM | POA: Diagnosis not present

## 2022-02-16 DIAGNOSIS — G459 Transient cerebral ischemic attack, unspecified: Secondary | ICD-10-CM

## 2022-02-16 DIAGNOSIS — E781 Pure hyperglyceridemia: Secondary | ICD-10-CM

## 2022-02-16 DIAGNOSIS — I6523 Occlusion and stenosis of bilateral carotid arteries: Secondary | ICD-10-CM | POA: Diagnosis not present

## 2022-02-16 NOTE — Progress Notes (Signed)
MRN : [id number]  [facility] Tredarius Cobern. is a 86 y.o. (Jun 30, 1936) male who presents with chief complaint of check carotid arteries.  History of Present Illness:   The patient is seen for follow up evaluation of carotid stenosis status post CT angiogram. CT scan was done 02/11/2022. Patient reports that the test went well with no problems or complications.   The patient denies interval amaurosis fugax. There is no recent or interval TIA symptoms or focal motor deficits. There is no prior documented CVA.  The patient is taking enteric-coated aspirin 81 mg daily.  There is no history of migraine headaches. There is no history of seizures.  No recent shortening of the patient's walking distance or new symptoms consistent with claudication.  No history of rest pain symptoms. No new ulcers or wounds of the lower extremities have occurred.  There is no history of DVT, PE or superficial thrombophlebitis. No recent episodes of angina or shortness of breath documented.   CT angiogram is reviewed by me personally and shows 80% stenosis of the proximal left ICA.  This is consistent with calcified plaque at the origin of the left internal carotid artery.  Also noted is a 50% mid  left common carotid artery as well as a bovine arch  Current Meds  Medication Sig   aspirin EC 81 MG tablet Take 81 mg by mouth daily.   atorvastatin (LIPITOR) 40 MG tablet Take 1 tablet by mouth once daily   Cholecalciferol (VITAMIN D3) 20 MCG TABS Take 1 tablet by mouth daily.    Cyanocobalamin (VITAMIN B12) 1000 MCG TBCR Take 1 tablet by mouth daily.   EUTHYROX 75 MCG tablet TAKE 1 TABLET BY MOUTH ONCE DAILY BEFORE BREAKFAST   glucose blood (ONETOUCH VERIO) test strip Test TID. Use as instructed   hydrocortisone 2.5 % cream Apply topically.   ketoconazole (NIZORAL) 2 % shampoo Apply 1 application topically 2 (two) times a week.   Lancets (ONETOUCH ULTRASOFT) lancets Test TID as instructed   losartan  (COZAAR) 50 MG tablet Take 1 tablet by mouth once daily   meloxicam (MOBIC) 7.5 MG tablet Take 1 tablet (7.5 mg total) by mouth daily as needed for pain.   metFORMIN (GLUCOPHAGE-XR) 500 MG 24 hr tablet Take 1 tablet by mouth once daily with breakfast   Multiple Vitamins-Minerals (MULTIVITAMIN ADULT PO) Take 1 tablet by mouth daily.    nystatin cream (MYCOSTATIN) Apply topically 2 (two) times daily.   omeprazole (PRILOSEC) 20 MG capsule Take 20 mg by mouth daily. PRN   tamsulosin (FLOMAX) 0.4 MG CAPS capsule Take 1 capsule (0.4 mg total) by mouth 2 (two) times daily.   triamcinolone cream (KENALOG) 0.1 % APPLY TO AFFECTED AREAS TWICE DAILY UNTIL IMPROVED THEN DISCONTINUE   triamterene-hydrochlorothiazide (MAXZIDE) 75-50 MG tablet Take 1 tablet by mouth once daily    Past Medical History:  Diagnosis Date   Arthritis    GERD (gastroesophageal reflux disease)    Hypertension    Phimosis    Pre-diabetes     Past Surgical History:  Procedure Laterality Date   CIRCUMCISION     COLONOSCOPY WITH PROPOFOL N/A 03/31/2016   Procedure: COLONOSCOPY WITH PROPOFOL;  Surgeon: Lollie Sails, MD;  Location: [facility]COPY;  Service: Endoscopy;  Laterality: N/A;   MRI     SINUS SURGERY  1994   on left side   TONSILLECTOMY      Social History Social History   Tobacco Use   Smoking  status: Never   Smokeless tobacco: Never  Vaping Use   Vaping Use: Never used  Substance Use Topics   Alcohol use: No    Alcohol/week: 0.0 standard drinks of alcohol   Drug use: No    Family History Family History  Problem Relation Age of Onset   Alzheimer's disease Mother    CAD Father    Diabetes Sister        type 2    No Known Allergies   REVIEW OF SYSTEMS (Negative unless checked)  Constitutional: '[]'$ Weight loss  '[]'$ Fever  '[]'$ Chills Cardiac: '[]'$ Chest pain   '[]'$ Chest pressure   '[]'$ Palpitations   '[]'$ Shortness of breath when laying flat   '[]'$ Shortness of breath with exertion. Vascular:  '[x]'$ Pain in legs  with walking   '[]'$ Pain in legs at rest  '[]'$ History of DVT   '[]'$ Phlebitis   '[]'$ Swelling in legs   '[]'$ Varicose veins   '[]'$ Non-healing ulcers Pulmonary:   '[]'$ Uses home oxygen   '[]'$ Productive cough   '[]'$ Hemoptysis   '[]'$ Wheeze  '[]'$ COPD   '[]'$ Asthma Neurologic:  '[]'$ Dizziness   '[]'$ Seizures   '[]'$ History of stroke   '[]'$ History of TIA  '[]'$ Aphasia   '[]'$ Vissual changes   '[]'$ Weakness or numbness in arm   '[]'$ Weakness or numbness in leg Musculoskeletal:   '[]'$ Joint swelling   '[]'$ Joint pain   '[]'$ Low back pain Hematologic:  '[]'$ Easy bruising  '[]'$ Easy bleeding   '[]'$ Hypercoagulable state   '[]'$ Anemic Gastrointestinal:  '[]'$ Diarrhea   '[]'$ Vomiting  '[]'$ Gastroesophageal reflux/heartburn   '[]'$ Difficulty swallowing. Genitourinary:  '[]'$ Chronic kidney disease   '[]'$ Difficult urination  '[]'$ Frequent urination   '[]'$ Blood in urine Skin:  '[]'$ Rashes   '[]'$ Ulcers  Psychological:  '[]'$ History of anxiety   '[]'$  History of major depression.  Physical Examination  Vitals:   02/16/22 0844  BP: (!) 184/77  Pulse: 78  Resp: 16  Weight: 197 lb 9.6 oz (89.6 kg)   Body mass index is 29.18 kg/m. Gen: WD/WN, NAD Head: [place]/AT, No temporalis wasting.  Ear/Nose/Throat: Hearing grossly intact, nares w/o erythema or drainage Eyes: PER, EOMI, sclera nonicteric.  Neck: Supple, no masses.  No bruit or JVD.  Pulmonary:  Good air movement, no audible wheezing, no use of accessory muscles.  Cardiac: RRR, normal S1, S2, no Murmurs. Vascular:  carotid bruit noted left side Vessel Right Left  Radial Palpable Palpable  Carotid  Palpable  Palpable  Gastrointestinal: soft, non-distended. No guarding/no peritoneal signs.  Musculoskeletal: M/S 5/5 throughout.  No visible deformity.  Neurologic: CN 2-12 intact. Pain and light touch intact in extremities.  Symmetrical.  Speech is fluent. Motor exam as listed above. Psychiatric: Judgment intact, Mood & affect appropriate for pt's clinical situation. Dermatologic: No rashes or ulcers noted.  No changes consistent with cellulitis.   CBC Lab  Results  Component Value Date   WBC 8.0 01/09/2022   HGB 14.5 01/09/2022   HCT 43.0 01/09/2022   MCV 91.3 01/09/2022   PLT 276 01/09/2022    BMET    Component Value Date/Time   NA 136 02/06/2022 1617   K 3.9 02/06/2022 1617   CL 97 02/06/2022 1617   CO2 22 02/06/2022 1617   GLUCOSE 116 (H) 02/06/2022 1617   GLUCOSE 136 (H) 01/09/2022 1058   BUN 15 02/06/2022 1617   CREATININE 0.98 02/06/2022 1617   CALCIUM 10.2 02/06/2022 1617   GFRNONAA >60 01/09/2022 1058   GFRAA >60 12/23/2019 1352   Estimated Creatinine Clearance: 63.3 mL/min (by C-G formula based on SCr of 0.98 mg/dL).  COAG No results found for: "  INR", "PROTIME"  Radiology CT ANGIO NECK W OR WO CONTRAST  Result Date: 02/11/2022 CLINICAL DATA:  Carotid artery stenosis. EXAM: CT ANGIOGRAPHY NECK TECHNIQUE: Multidetector CT imaging of the neck was performed using the standard protocol during bolus administration of intravenous contrast. Multiplanar CT image reconstructions and MIPs were obtained to evaluate the vascular anatomy. Carotid stenosis measurements (when applicable) are obtained utilizing NASCET criteria, using the distal internal carotid diameter as the denominator. RADIATION DOSE REDUCTION: This exam was performed according to the departmental dose-optimization program which includes automated exposure control, adjustment of the mA and/or kV according to patient size and/or use of iterative reconstruction technique. CONTRAST:  89m OMNIPAQUE IOHEXOL 350 MG/ML SOLN COMPARISON:  Ultrasound 11/27/2015 FINDINGS: Aortic arch: Aortic atherosclerosis. Branching pattern is normal without origin stenosis. Right carotid system: Common carotid artery is tortuous but widely patent to the bifurcation. There is calcified plaque at the carotid bifurcation and ICA bulb but no stenosis. Cervical ICA is tortuous but widely patent. Left carotid system: Common carotid artery shows scattered plaque with a 50% stenosis 2.5 cm proximal to the  bifurcation. Complex soft and calcified plaque at the carotid bifurcation and proximal ICA bulb. Minimal diameter is 1.7 mm. Compared to a more distal cervical ICA diameter of 5.1 mm, this indicates a 60-70% stenosis. There is also a moderate stenosis of the proximal external carotid artery. Vertebral arteries: Both vertebral arteries show atherosclerotic plaque at their origins. No stenosis of the dominant left vertebral artery origin. 30% stenosis of the non dominant right vertebral artery origin. Beyond that, both vessels are widely patent through the cervical region to the foramen magnum. There is atherosclerotic plaque of the vertebral artery V4 segments with stenosis estimated at 70% on both sides. The right vertebral artery supplies PICA and then terminates. The left vertebral artery supplies the basilar. Skeleton: Ordinary cervical spondylosis and facet arthritis. Other neck: No mass or lymphadenopathy. Upper chest: Lung apices are clear. IMPRESSION: Aortic atherosclerosis. Atherosclerotic disease at both carotid bifurcations. No stenosis on the right. On the left, complex calcified and soft plaque results in 60-70% stenosis of the proximal ICA. There is also a moderate proximal ECA stenosis. Atherosclerotic disease of both vertebral artery V4 segments with stenosis estimated at 70% on both sides. Non dominant right vertebral artery serves PICA and then appears to terminate. Left vertebral artery supplies the basilar. Electronically Signed   By: MNelson ChimesM.D.   On: 02/11/2022 16:14   VAS [place]  Result Date: 01/29/2022 Carotid Arterial Duplex Study [facility]  Date of Exam:   01/29/2022 Medical Rec #: [id number]                     Accession #:    [id number] of Birth: [birth year]                     Patient Gender: M Patient Age:   89years Exam Location:  [facility] Procedure:      VAS [place] Referring Phys: GHortencia Pilar --------------------------------------------------------------------------------  Indications: Carotid artery disease. Performing Technologist: WBlondell RevealRT, RDMS, RVT  Examination Guidelines: A complete evaluation includes B-mode imaging, spectral Doppler, color Doppler, and power Doppler as needed of all accessible portions of each vessel. Bilateral testing is considered an integral part of a complete examination. Limited examinations for reoccurring indications may be performed as noted.  Right Carotid Findings: +----------+--------+--------+--------+------------------+------------------+  PSV cm/sEDV cm/sStenosisPlaque DescriptionComments           +----------+--------+--------+--------+------------------+------------------+ CCA Prox  103     0                                                    +----------+--------+--------+--------+------------------+------------------+ CCA Mid   126     6                                                    +----------+--------+--------+--------+------------------+------------------+ CCA Distal106     7                                                    +----------+--------+--------+--------+------------------+------------------+ ICA Prox  104     10      1-39%   heterogenous      ICA/CCA ratio=0.98 +----------+--------+--------+--------+------------------+------------------+ ICA Mid   65      15                                                   +----------+--------+--------+--------+------------------+------------------+ ICA Distal55      10                                                   +----------+--------+--------+--------+------------------+------------------+ ECA       203     0               heterogenous                         +----------+--------+--------+--------+------------------+------------------+ +----------+--------+-------+----------------+-------------------+           PSV cm/sEDV  cmsDescribe        Arm Pressure (mmHG) +----------+--------+-------+----------------+-------------------+ [id number]            Multiphasic, WNL                    +----------+--------+-------+----------------+-------------------+ +---------+--------+--+--------+----------------------------+ VertebralPSV cm/s41EDV cm/sAntegrade and High resistant +---------+--------+--+--------+----------------------------+  Left Carotid Findings: +----------+--------+--------+--------+------------------+-----------------+           PSV cm/sEDV cm/sStenosisPlaque DescriptionComments          +----------+--------+--------+--------+------------------+-----------------+ CCA Prox  97      14              heterogenous                        +----------+--------+--------+--------+------------------+-----------------+ CCA Mid   125     17                                                  +----------+--------+--------+--------+------------------+-----------------+  CCA Distal86      12              heterogenous                        +----------+--------+--------+--------+------------------+-----------------+ ICA Prox  392     51      80-99%  heterogenous      ICA/CCA ratio=4.6 +----------+--------+--------+--------+------------------+-----------------+ ICA Mid   91      11                                                  +----------+--------+--------+--------+------------------+-----------------+ ICA Distal53      15                                                  +----------+--------+--------+--------+------------------+-----------------+ ECA       315     12      >50%    heterogenous                        +----------+--------+--------+--------+------------------+-----------------+ +----------+--------+--------+----------------+-------------------+           PSV cm/sEDV cm/sDescribe        Arm Pressure (mmHG)  +----------+--------+--------+----------------+-------------------+ [id number]             Multiphasic, WNL                    +----------+--------+--------+----------------+-------------------+ +---------+--------+--+--------+---------+ VertebralPSV cm/s35EDV cm/sAntegrade +---------+--------+--+--------+---------+  Resistive flow noted in the bilateral carotid arteries (R>L). Summary: Right Carotid: Velocities in the right ICA are consistent with a 1-39% stenosis.                The extracranial vessels were near-normal with only minimal wall                thickening or plaque. Left Carotid: Peak systolic velocity and ICA/CCA ratio suggest a possible 80-99%               stenosis of the left ICA origin/bifurcation region. Increase in the maximum left ICA velocity when compared to the previous exam on 01/30/21 with the right carotid arteries remaining stable.  *See table(s) above for measurements and observations.  Electronically signed by [name] MD on 01/29/2022 at 7:17:23 PM.    Final      Assessment/Plan 1. Bilateral carotid artery stenosis Recommend:  The patient is symptomatic with respect to the carotid stenosis.  The patient now has progressed and has a lesion the is >80%.  Patient's CT angiography of the carotid arteries confirms >80% left ICA stenosis.  The anatomical considerations support stenting over surgery.  This was discussed in detail with the patient.  The risks, benefits and alternative therapies were reviewed in detail with the patient.  All questions were answered.  The patient agrees to proceed with stenting of the left carotid artery.  Continue antiplatelet therapy as prescribed. Continue management of CAD, HTN and Hyperlipidemia. Healthy heart diet, encouraged exercise at least 4 times per week.    2. TIA (transient ischemic attack) See #1  3. Primary hypertension Continue antihypertensive medications as already ordered, these medications have been  reviewed and there are no  changes at this time.   4. Pure hyperglyceridemia Continue statin as ordered and reviewed, no changes at this time     [name], MD  02/16/2022 8:48 AM

## 2022-02-20 ENCOUNTER — Telehealth (INDEPENDENT_AMBULATORY_CARE_PROVIDER_SITE_OTHER): Payer: Self-pay

## 2022-02-20 NOTE — Telephone Encounter (Signed)
I attempted to contact the patient to discuss the pre-procedure instructions for 03/17/22. Patient and I discussed being scheduled for his left carotid stent placement on 03/17/22 with Dr. Delana Meyer. Patient is scheduled with a 6:45 am arrival time to the MM. Pre-procedure instructions will be mailed. Patient called back and instructions were discussed and will be mailed.

## 2022-03-02 ENCOUNTER — Encounter: Payer: Self-pay | Admitting: Cardiology

## 2022-03-02 ENCOUNTER — Ambulatory Visit (INDEPENDENT_AMBULATORY_CARE_PROVIDER_SITE_OTHER): Payer: Medicare Other | Admitting: Cardiology

## 2022-03-02 VITALS — BP 140/80 | HR 90 | Ht 70.0 in | Wt 198.0 lb

## 2022-03-02 DIAGNOSIS — R778 Other specified abnormalities of plasma proteins: Secondary | ICD-10-CM | POA: Diagnosis not present

## 2022-03-02 DIAGNOSIS — I10 Essential (primary) hypertension: Secondary | ICD-10-CM | POA: Diagnosis not present

## 2022-03-02 DIAGNOSIS — I739 Peripheral vascular disease, unspecified: Secondary | ICD-10-CM | POA: Diagnosis not present

## 2022-03-02 DIAGNOSIS — I6522 Occlusion and stenosis of left carotid artery: Secondary | ICD-10-CM | POA: Diagnosis not present

## 2022-03-09 ENCOUNTER — Encounter
Admission: RE | Admit: 2022-03-09 | Discharge: 2022-03-09 | Disposition: A | Discharge status: Home / Self Care | Payer: Medicare Other | Source: Ambulatory Visit | Attending: Cardiology | Admitting: Cardiology

## 2022-03-09 DIAGNOSIS — I739 Peripheral vascular disease, unspecified: Secondary | ICD-10-CM | POA: Insufficient documentation

## 2022-03-09 DIAGNOSIS — I6522 Occlusion and stenosis of left carotid artery: Secondary | ICD-10-CM | POA: Insufficient documentation

## 2022-03-09 LAB — NM MYOCAR MULTI W/SPECT W/WALL MOTION / EF
LV dias vol: 86 mL (ref 62–150)
LV sys vol: 42 mL
Nuc Stress EF: 51 %
Peak HR: 97 {beats}/min
Percent HR: 71 %
Rest HR: 67 {beats}/min
Rest Nuclear Isotope Dose: 10.7 mCi
SDS: 1
SRS: 2
SSS: 1
ST Depression (mm): 0 mm
Stress Nuclear Isotope Dose: 31.8 mCi
TID: 1.13

## 2022-03-09 MED ORDER — REGADENOSON 0.4 MG/5ML IV SOLN
0.4000 mg | Freq: Once | INTRAVENOUS | Status: AC
  Administered 2022-03-09: 0.4 mg via INTRAVENOUS

## 2022-03-09 MED ORDER — TECHNETIUM TC 99M TETROFOSMIN IV KIT
30.7800 | PACK | Freq: Once | INTRAVENOUS | Status: AC | PRN
  Administered 2022-03-09: 30.78 via INTRAVENOUS

## 2022-03-09 MED ORDER — TECHNETIUM TC 99M TETROFOSMIN IV KIT
10.6900 | PACK | Freq: Once | INTRAVENOUS | Status: AC | PRN
  Administered 2022-03-09: 10.69 via INTRAVENOUS

## 2022-03-13 NOTE — Telephone Encounter (Signed)
Patient called wanting to postpone his left carotid stent placement until 04/14/22 due to having dental work and extractions. Patient has been rescheduled and paperwork mailed.

## 2022-03-17 DIAGNOSIS — I6522 Occlusion and stenosis of left carotid artery: Secondary | ICD-10-CM

## 2022-04-01 ENCOUNTER — Other Ambulatory Visit: Payer: Self-pay | Admitting: Cardiology

## 2022-04-01 DIAGNOSIS — R778 Other specified abnormalities of plasma proteins: Secondary | ICD-10-CM

## 2022-04-03 DIAGNOSIS — M3501 Sicca syndrome with keratoconjunctivitis: Secondary | ICD-10-CM | POA: Diagnosis not present

## 2022-04-03 LAB — HM DIABETES EYE EXAM

## 2022-04-07 ENCOUNTER — Ambulatory Visit (INDEPENDENT_AMBULATORY_CARE_PROVIDER_SITE_OTHER): Payer: Medicare Other

## 2022-04-07 DIAGNOSIS — I10 Essential (primary) hypertension: Secondary | ICD-10-CM

## 2022-04-07 DIAGNOSIS — R778 Other specified abnormalities of plasma proteins: Secondary | ICD-10-CM

## 2022-04-07 LAB — ECHOCARDIOGRAM COMPLETE
AR max vel: 2.93 cm2
AV Area VTI: 3.21 cm2
AV Area mean vel: 3.18 cm2
AV Mean grad: 2 mmHg
AV Peak grad: 4.7 mmHg
Ao pk vel: 1.08 m/s
Area-P 1/2: 2.43 cm2
Calc EF: 48.6 %
P 1/2 time: 746 msec
S' Lateral: 2.8 cm
Single Plane A2C EF: 42.9 %
Single Plane A4C EF: 53.6 %

## 2022-04-08 ENCOUNTER — Other Ambulatory Visit: Payer: Self-pay | Admitting: Family Medicine

## 2022-04-13 ENCOUNTER — Telehealth (INDEPENDENT_AMBULATORY_CARE_PROVIDER_SITE_OTHER): Payer: Self-pay

## 2022-04-13 NOTE — Telephone Encounter (Signed)
Patient's spouse called asking about the procedure and care after for the patient following his carotid stent placement. I advised that the patient will be spoken to by the provider after his procedure and care will be talked about as well as a discharge summary given talking about care and his next appt to either schedule or the appt itself.

## 2022-04-14 ENCOUNTER — Other Ambulatory Visit: Payer: Self-pay

## 2022-04-14 ENCOUNTER — Encounter
Admission: RE | Disposition: A | Discharge status: Home / Self Care | Payer: Self-pay | Source: Home / Self Care | Attending: Vascular Surgery

## 2022-04-14 ENCOUNTER — Inpatient Hospital Stay
Admission: RE | Admit: 2022-04-14 | Discharge: 2022-04-16 | DRG: 036 | Disposition: A | Discharge status: Home / Self Care | Payer: Medicare Other | Attending: Vascular Surgery | Admitting: Vascular Surgery

## 2022-04-14 ENCOUNTER — Ambulatory Visit: Payer: Medicare Other | Admitting: Cardiology

## 2022-04-14 ENCOUNTER — Inpatient Hospital Stay: Payer: Medicare Other

## 2022-04-14 ENCOUNTER — Encounter: Payer: Self-pay | Admitting: Vascular Surgery

## 2022-04-14 DIAGNOSIS — Z7982 Long term (current) use of aspirin: Secondary | ICD-10-CM | POA: Diagnosis not present

## 2022-04-14 DIAGNOSIS — Z7984 Long term (current) use of oral hypoglycemic drugs: Secondary | ICD-10-CM | POA: Diagnosis not present

## 2022-04-14 DIAGNOSIS — K219 Gastro-esophageal reflux disease without esophagitis: Secondary | ICD-10-CM | POA: Diagnosis present

## 2022-04-14 DIAGNOSIS — I959 Hypotension, unspecified: Secondary | ICD-10-CM | POA: Diagnosis not present

## 2022-04-14 DIAGNOSIS — Z8249 Family history of ischemic heart disease and other diseases of the circulatory system: Secondary | ICD-10-CM | POA: Diagnosis not present

## 2022-04-14 DIAGNOSIS — Z79890 Hormone replacement therapy (postmenopausal): Secondary | ICD-10-CM

## 2022-04-14 DIAGNOSIS — I10 Essential (primary) hypertension: Secondary | ICD-10-CM | POA: Diagnosis present

## 2022-04-14 DIAGNOSIS — I442 Atrioventricular block, complete: Secondary | ICD-10-CM | POA: Diagnosis not present

## 2022-04-14 DIAGNOSIS — I700 Atherosclerosis of aorta: Secondary | ICD-10-CM | POA: Diagnosis present

## 2022-04-14 DIAGNOSIS — I6521 Occlusion and stenosis of right carotid artery: Secondary | ICD-10-CM | POA: Diagnosis not present

## 2022-04-14 DIAGNOSIS — R339 Retention of urine, unspecified: Secondary | ICD-10-CM | POA: Diagnosis not present

## 2022-04-14 DIAGNOSIS — Z79899 Other long term (current) drug therapy: Secondary | ICD-10-CM

## 2022-04-14 DIAGNOSIS — Z8673 Personal history of transient ischemic attack (TIA), and cerebral infarction without residual deficits: Secondary | ICD-10-CM | POA: Diagnosis not present

## 2022-04-14 DIAGNOSIS — I6523 Occlusion and stenosis of bilateral carotid arteries: Principal | ICD-10-CM | POA: Diagnosis present

## 2022-04-14 DIAGNOSIS — R1310 Dysphagia, unspecified: Secondary | ICD-10-CM | POA: Diagnosis present

## 2022-04-14 DIAGNOSIS — I441 Atrioventricular block, second degree: Secondary | ICD-10-CM | POA: Diagnosis not present

## 2022-04-14 DIAGNOSIS — I6522 Occlusion and stenosis of left carotid artery: Secondary | ICD-10-CM | POA: Diagnosis not present

## 2022-04-14 DIAGNOSIS — E119 Type 2 diabetes mellitus without complications: Secondary | ICD-10-CM | POA: Diagnosis present

## 2022-04-14 DIAGNOSIS — I2510 Atherosclerotic heart disease of native coronary artery without angina pectoris: Secondary | ICD-10-CM | POA: Diagnosis present

## 2022-04-14 DIAGNOSIS — Q2549 Other congenital malformations of aorta: Secondary | ICD-10-CM

## 2022-04-14 DIAGNOSIS — E039 Hypothyroidism, unspecified: Secondary | ICD-10-CM | POA: Diagnosis present

## 2022-04-14 DIAGNOSIS — I77810 Thoracic aortic ectasia: Secondary | ICD-10-CM | POA: Diagnosis present

## 2022-04-14 DIAGNOSIS — E781 Pure hyperglyceridemia: Secondary | ICD-10-CM | POA: Diagnosis present

## 2022-04-14 DIAGNOSIS — E785 Hyperlipidemia, unspecified: Secondary | ICD-10-CM | POA: Diagnosis present

## 2022-04-14 DIAGNOSIS — Z9889 Other specified postprocedural states: Secondary | ICD-10-CM | POA: Diagnosis not present

## 2022-04-14 DIAGNOSIS — R001 Bradycardia, unspecified: Secondary | ICD-10-CM | POA: Diagnosis not present

## 2022-04-14 DIAGNOSIS — Z833 Family history of diabetes mellitus: Secondary | ICD-10-CM | POA: Diagnosis not present

## 2022-04-14 DIAGNOSIS — Z95828 Presence of other vascular implants and grafts: Secondary | ICD-10-CM | POA: Diagnosis not present

## 2022-04-14 DIAGNOSIS — R4781 Slurred speech: Secondary | ICD-10-CM | POA: Diagnosis not present

## 2022-04-14 HISTORY — DX: Chronic vascular disorders of intestine: K55.1

## 2022-04-14 HISTORY — DX: Celiac artery compression syndrome: I77.4

## 2022-04-14 HISTORY — DX: Occlusion and stenosis of unspecified carotid artery: I65.29

## 2022-04-14 HISTORY — DX: Stricture of artery: I77.1

## 2022-04-14 HISTORY — DX: Atherosclerotic heart disease of native coronary artery without angina pectoris: I25.10

## 2022-04-14 HISTORY — PX: CAROTID PTA/STENT INTERVENTION: CATH118231

## 2022-04-14 LAB — GLUCOSE, CAPILLARY
Glucose-Capillary: 147 mg/dL — ABNORMAL HIGH (ref 70–99)
Glucose-Capillary: 164 mg/dL — ABNORMAL HIGH (ref 70–99)
Glucose-Capillary: 161 mg/dL — ABNORMAL HIGH (ref 70–99)
Glucose-Capillary: 154 mg/dL — ABNORMAL HIGH (ref 70–99)
Glucose-Capillary: 165 mg/dL — ABNORMAL HIGH (ref 70–99)

## 2022-04-14 LAB — BUN: BUN: 24 mg/dL — ABNORMAL HIGH (ref 8–23)

## 2022-04-14 LAB — CREATININE, SERUM
Creatinine, Ser: 1.31 mg/dL — ABNORMAL HIGH (ref 0.61–1.24)
GFR, Estimated: 54 mL/min — ABNORMAL LOW (ref >60)

## 2022-04-14 SURGERY — CAROTID PTA/STENT INTERVENTION
Anesthesia: Moderate Sedation | Laterality: Left

## 2022-04-14 MED ORDER — HEPARIN SODIUM (PORCINE) 1000 UNIT/ML IJ SOLN
INTRAMUSCULAR | Status: AC
  Filled 2022-04-14: qty 10

## 2022-04-14 MED ORDER — OXYCODONE HCL 5 MG PO TABS: 5.0000 mg | ORAL_TABLET | ORAL | Status: DC | PRN

## 2022-04-14 MED ORDER — TAMSULOSIN HCL 0.4 MG PO CAPS
0.4000 mg | ORAL_CAPSULE | Freq: Two times a day (BID) | ORAL | Status: DC
  Administered 2022-04-15 (×2): 0.4 mg via ORAL
  Filled 2022-04-14 (×3): qty 1

## 2022-04-14 MED ORDER — FENTANYL CITRATE (PF) 100 MCG/2ML IJ SOLN
INTRAMUSCULAR | Status: DC | PRN
  Administered 2022-04-14: 50 ug via INTRAVENOUS
  Administered 2022-04-14: 25 ug via INTRAVENOUS

## 2022-04-14 MED ORDER — MIDAZOLAM HCL 5 MG/5ML IJ SOLN
INTRAMUSCULAR | Status: AC
  Filled 2022-04-14: qty 5

## 2022-04-14 MED ORDER — DOCUSATE SODIUM 100 MG PO CAPS
100.0000 mg | ORAL_CAPSULE | Freq: Every day | ORAL | Status: DC
  Administered 2022-04-15 – 2022-04-16 (×2): 100 mg via ORAL
  Filled 2022-04-14 (×2): qty 1

## 2022-04-14 MED ORDER — PHENYLEPHRINE HCL-NACL 20-0.9 MG/250ML-% IV SOLN
INTRAVENOUS | Status: AC
  Filled 2022-04-14: qty 250

## 2022-04-14 MED ORDER — FENTANYL CITRATE (PF) 100 MCG/2ML IJ SOLN
INTRAMUSCULAR | Status: AC
  Filled 2022-04-14: qty 2

## 2022-04-14 MED ORDER — MIDAZOLAM HCL 2 MG/2ML IJ SOLN
INTRAMUSCULAR | Status: DC | PRN
  Administered 2022-04-14: 1 mg via INTRAVENOUS
  Administered 2022-04-14: .5 mg via INTRAVENOUS

## 2022-04-14 MED ORDER — HYDROMORPHONE HCL 1 MG/ML IJ SOLN: 1.0000 mg | Freq: Once | INTRAMUSCULAR | Status: DC | PRN

## 2022-04-14 MED ORDER — ACETAMINOPHEN 325 MG PO TABS
325.0000 mg | ORAL_TABLET | ORAL | Status: DC | PRN
  Administered 2022-04-16: 650 mg via ORAL
  Filled 2022-04-14: qty 2

## 2022-04-14 MED ORDER — LEVOTHYROXINE SODIUM 75 MCG PO TABS
75.0000 ug | ORAL_TABLET | Freq: Every day | ORAL | Status: DC
  Administered 2022-04-16: 75 ug via ORAL
  Filled 2022-04-14 (×2): qty 1

## 2022-04-14 MED ORDER — ASPIRIN 81 MG PO TBEC: 81.0000 mg | DELAYED_RELEASE_TABLET | Freq: Every day | ORAL | Status: DC

## 2022-04-14 MED ORDER — ATROPINE SULFATE 1 MG/10ML IJ SOSY
PREFILLED_SYRINGE | INTRAMUSCULAR | Status: DC | PRN
  Administered 2022-04-14: 1 mg via INTRAVENOUS

## 2022-04-14 MED ORDER — ONDANSETRON HCL 4 MG/2ML IJ SOLN
4.0000 mg | Freq: Four times a day (QID) | INTRAMUSCULAR | Status: DC | PRN
Start: 2022-04-14 — End: 2022-04-16
  Administered 2022-04-14: 4 mg via INTRAVENOUS

## 2022-04-14 MED ORDER — TIROFIBAN (AGGRASTAT) BOLUS VIA INFUSION: 25.0000 ug/kg | INTRAVENOUS | Status: DC

## 2022-04-14 MED ORDER — PANTOPRAZOLE SODIUM 40 MG IV SOLR
40.0000 mg | Freq: Every day | INTRAVENOUS | Status: DC
  Administered 2022-04-14 – 2022-04-15 (×2): 40 mg via INTRAVENOUS
  Filled 2022-04-14: qty 10

## 2022-04-14 MED ORDER — INSULIN ASPART 100 UNIT/ML IJ SOLN
0.0000 [IU] | Freq: Three times a day (TID) | INTRAMUSCULAR | Status: DC
  Administered 2022-04-14 – 2022-04-15 (×4): 3 [IU] via SUBCUTANEOUS
  Administered 2022-04-15 – 2022-04-16 (×2): 2 [IU] via SUBCUTANEOUS
  Filled 2022-04-14 (×6): qty 1

## 2022-04-14 MED ORDER — CEFAZOLIN SODIUM-DEXTROSE 2-4 GM/100ML-% IV SOLN
2.0000 g | Freq: Three times a day (TID) | INTRAVENOUS | Status: AC
  Administered 2022-04-14 – 2022-04-15 (×2): 2 g via INTRAVENOUS
  Filled 2022-04-14 (×2): qty 100

## 2022-04-14 MED ORDER — CLOPIDOGREL BISULFATE 75 MG PO TABS
75.0000 mg | ORAL_TABLET | Freq: Every day | ORAL | Status: DC
Start: 2022-04-15 — End: 2022-04-15

## 2022-04-14 MED ORDER — PHENOL 1.4 % MT LIQD
1.0000 | OROMUCOSAL | Status: DC | PRN
Start: 2022-04-14 — End: 2022-04-16

## 2022-04-14 MED ORDER — KCL IN DEXTROSE-NACL 20-5-0.9 MEQ/L-%-% IV SOLN
INTRAVENOUS | Status: DC
  Filled 2022-04-14 (×6): qty 1000

## 2022-04-14 MED ORDER — TIROFIBAN HCL IV 12.5 MG/250 ML
0.0750 ug/kg/min | INTRAVENOUS | Status: AC
  Administered 2022-04-14: 0.075 ug/kg/min via INTRAVENOUS
  Filled 2022-04-14: qty 250

## 2022-04-14 MED ORDER — MIDAZOLAM HCL 2 MG/ML PO SYRP: 8.0000 mg | ORAL_SOLUTION | Freq: Once | ORAL | Status: DC | PRN

## 2022-04-14 MED ORDER — PHENYLEPHRINE 80 MCG/ML (10ML) SYRINGE FOR IV PUSH (FOR BLOOD PRESSURE SUPPORT)
PREFILLED_SYRINGE | INTRAVENOUS | Status: DC | PRN
  Administered 2022-04-14 (×2): 1 ug via INTRAVENOUS

## 2022-04-14 MED ORDER — SODIUM CHLORIDE 0.9 % IV SOLN: 500.0000 mL | Freq: Once | INTRAVENOUS | Status: DC | PRN

## 2022-04-14 MED ORDER — ATORVASTATIN CALCIUM 20 MG PO TABS
40.0000 mg | ORAL_TABLET | Freq: Every day | ORAL | Status: DC
  Administered 2022-04-15 – 2022-04-16 (×2): 40 mg via ORAL
  Filled 2022-04-14 (×2): qty 2

## 2022-04-14 MED ORDER — FAMOTIDINE 20 MG PO TABS: 40.0000 mg | ORAL_TABLET | Freq: Once | ORAL | Status: DC | PRN

## 2022-04-14 MED ORDER — BISACODYL 10 MG RE SUPP
10.0000 mg | Freq: Every day | RECTAL | Status: DC | PRN
  Administered 2022-04-14: 10 mg via RECTAL
  Filled 2022-04-14: qty 1

## 2022-04-14 MED ORDER — ASPIRIN 81 MG PO TBEC
81.0000 mg | DELAYED_RELEASE_TABLET | Freq: Every day | ORAL | Status: DC
  Administered 2022-04-16: 81 mg via ORAL
  Filled 2022-04-14 (×2): qty 1

## 2022-04-14 MED ORDER — CEFAZOLIN SODIUM-DEXTROSE 2-4 GM/100ML-% IV SOLN: 2.0000 g | INTRAVENOUS | Status: AC

## 2022-04-14 MED ORDER — MIDAZOLAM HCL 5 MG/5ML IJ SOLN
INTRAMUSCULAR | Status: DC
Start: 2022-04-14 — End: 2022-04-14
  Filled 2022-04-14: qty 5

## 2022-04-14 MED ORDER — CHLORHEXIDINE GLUCONATE CLOTH 2 % EX PADS
6.0000 | MEDICATED_PAD | Freq: Every day | CUTANEOUS | Status: DC
Start: 2022-04-14 — End: 2022-04-16
  Administered 2022-04-14 – 2022-04-16 (×3): 6 via TOPICAL

## 2022-04-14 MED ORDER — HEPARIN SODIUM (PORCINE) 1000 UNIT/ML IJ SOLN
INTRAMUSCULAR | Status: DC | PRN
  Administered 2022-04-14: 9000 [IU] via INTRAVENOUS
  Administered 2022-04-14: 2000 [IU] via INTRAVENOUS

## 2022-04-14 MED ORDER — ONDANSETRON HCL 4 MG/2ML IJ SOLN: 4.0000 mg | Freq: Four times a day (QID) | INTRAMUSCULAR | Status: DC | PRN

## 2022-04-14 MED ORDER — DOPAMINE-DEXTROSE 3.2-5 MG/ML-% IV SOLN
INTRAVENOUS | Status: DC | PRN
  Administered 2022-04-14: 6 ug/kg/min via INTRAVENOUS

## 2022-04-14 MED ORDER — CLOPIDOGREL BISULFATE 75 MG PO TABS: 300.0000 mg | ORAL_TABLET | ORAL | Status: DC

## 2022-04-14 MED ORDER — PHENYLEPHRINE 80 MCG/ML (10ML) SYRINGE FOR IV PUSH (FOR BLOOD PRESSURE SUPPORT)
PREFILLED_SYRINGE | INTRAVENOUS | Status: AC
  Filled 2022-04-14: qty 10

## 2022-04-14 MED ORDER — ORAL CARE MOUTH RINSE: 15.0000 mL | OROMUCOSAL | Status: DC | PRN

## 2022-04-14 MED ORDER — ACETAMINOPHEN 325 MG RE SUPP: 325.0000 mg | RECTAL | Status: DC | PRN

## 2022-04-14 MED ORDER — DIPHENHYDRAMINE HCL 50 MG/ML IJ SOLN: 50.0000 mg | Freq: Once | INTRAMUSCULAR | Status: DC | PRN

## 2022-04-14 MED ORDER — CEFAZOLIN SODIUM-DEXTROSE 2-4 GM/100ML-% IV SOLN
INTRAVENOUS | Status: AC
  Administered 2022-04-14: 2 g via INTRAVENOUS
  Filled 2022-04-14: qty 100

## 2022-04-14 MED ORDER — ALUM & MAG HYDROXIDE-SIMETH 200-200-20 MG/5ML PO SUSP: 15.0000 mL | ORAL | Status: DC | PRN

## 2022-04-14 MED ORDER — ATROPINE SULFATE 1 MG/10ML IJ SOSY
PREFILLED_SYRINGE | INTRAMUSCULAR | Status: AC
  Filled 2022-04-14: qty 10

## 2022-04-14 MED ORDER — ONDANSETRON HCL 4 MG/2ML IJ SOLN
INTRAMUSCULAR | Status: AC
  Filled 2022-04-14: qty 2

## 2022-04-14 MED ORDER — IODIXANOL 320 MG/ML IV SOLN
INTRAVENOUS | Status: DC | PRN
  Administered 2022-04-14: 60 mL via INTRA_ARTERIAL

## 2022-04-14 MED ORDER — METHYLPREDNISOLONE SODIUM SUCC 125 MG IJ SOLR: 125.0000 mg | Freq: Once | INTRAMUSCULAR | Status: DC | PRN

## 2022-04-14 MED ORDER — DOPAMINE-DEXTROSE 3.2-5 MG/ML-% IV SOLN
0.0000 ug/kg/min | INTRAVENOUS | Status: DC
  Administered 2022-04-15: 8 ug/kg/min via INTRAVENOUS
  Filled 2022-04-14: qty 250

## 2022-04-14 MED ORDER — TIROFIBAN (AGGRASTAT) BOLUS VIA INFUSION
25.0000 ug/kg | Freq: Once | INTRAVENOUS | Status: AC
  Administered 2022-04-14: 2235 ug via INTRAVENOUS
  Filled 2022-04-14: qty 45

## 2022-04-14 MED ORDER — MORPHINE SULFATE (PF) 4 MG/ML IV SOLN
2.0000 mg | INTRAVENOUS | Status: DC | PRN
  Administered 2022-04-14: 2 mg via INTRAVENOUS
  Administered 2022-04-14 – 2022-04-15 (×2): 4 mg via INTRAVENOUS
  Filled 2022-04-14 (×4): qty 1

## 2022-04-14 MED ORDER — SODIUM CHLORIDE 0.9 % IV BOLUS
INTRAVENOUS | Status: AC | PRN
  Administered 2022-04-14: 500 mL via INTRAVENOUS

## 2022-04-14 MED ORDER — SODIUM CHLORIDE 0.9 % IV SOLN: INTRAVENOUS | Status: DC

## 2022-04-14 SURGICAL SUPPLY — 29 items
BALLN VTRAC 4.5X30X135 (BALLOONS) ×2
BALLOON VTRAC 4.5X30X135 (BALLOONS) IMPLANT
CATH ANGIO 5F 80CM MHK 2 (CATHETERS) ×1 IMPLANT
CATH ANGIO 5F PIGTAIL 100CM (CATHETERS) ×1 IMPLANT
CATH ANGIO 5F SIM1 100CM (CATHETERS) ×1 IMPLANT
CATH BEACON 5 .035 100 H1 TIP (CATHETERS) ×1 IMPLANT
CATH BEACON 5 .035 100 JB1 TIP (CATHETERS) ×1 IMPLANT
CATH G XP .038X100 (CATHETERS) ×1 IMPLANT
CATH INFINITI 5 FR 3DRC (CATHETERS) ×1 IMPLANT
CATH SIM1 5FR 125CM (CATHETERS) ×1 IMPLANT
CATH SIM2 100CM (CATHETERS) ×1 IMPLANT
CATH VTK 5FR 125CM BEACON TIP (CATHETERS) ×1 IMPLANT
COVER PROBE U/S 5X48 (MISCELLANEOUS) ×1 IMPLANT
DEVICE EMBOSHIELD NAV6 4.0-7.0 (FILTER) ×1 IMPLANT
DEVICE STARCLOSE SE CLOSURE (Vascular Products) ×1 IMPLANT
DEVICE TORQUE .025-.038 (MISCELLANEOUS) ×1 IMPLANT
GLIDEWIRE ANGLED SS 035X260CM (WIRE) ×2 IMPLANT
GUIDEWIRE VASC STIFF .038X260 (WIRE) ×1 IMPLANT
KIT CAROTID MANIFOLD (MISCELLANEOUS) ×1 IMPLANT
KIT ENCORE 26 ADVANTAGE (KITS) ×1 IMPLANT
NDL ENTRY 21GA 7CM ECHOTIP (NEEDLE) IMPLANT
NEEDLE ENTRY 21GA 7CM ECHOTIP (NEEDLE) ×2 IMPLANT
PACK ANGIOGRAPHY (CUSTOM PROCEDURE TRAY) ×2 IMPLANT
SET INTRO CAPELLA COAXIAL (SET/KITS/TRAYS/PACK) ×1 IMPLANT
SHEATH BRITE TIP 6FRX11 (SHEATH) ×1 IMPLANT
SHEATH NEURON MAX 6FR 80CM (SHEATH) ×1 IMPLANT
STENT XACT CAR 10-8X40X136 (Permanent Stent) ×1 IMPLANT
SYR MEDRAD MARK 7 150ML (SYRINGE) ×1 IMPLANT
WIRE GUIDERIGHT .035X150 (WIRE) ×1 IMPLANT

## 2022-04-14 NOTE — Progress Notes (Signed)
Dr. Delana Meyer into speak with daughter. Patients heart rate in the 40's and he is unable to urinate. Right groin bleeding minimal drainage. Per MD place foley catheter and he will have someone come up to inject epi to right groin. At this time no additional orders and he will re-evaluate latter this afternoon.continue to assess.

## 2022-04-14 NOTE — Progress Notes (Signed)
Per Dr. Delana Meyer he will place a swallow evaluation for patient tomorrow. Hold all PO medications. Foley inserted. Continue to assess.

## 2022-04-14 NOTE — Consult Note (Signed)
ANTICOAGULATION CONSULT NOTE - Initial Consult  Pharmacy Consult for Aggrastat infusion Indication:  s/p peripheral intervention  No Known Allergies  Patient Measurements: Height: '5\' 10"'$  (177.8 cm) Weight: 89.4 kg (197 lb 1.5 oz) IBW/kg (Calculated) : 73 Heparin Dosing Weight: N/A  Vital Signs: Temp: 94.1 F (34.5 C) (08/08 1121) Temp Source: Axillary (08/08 1121) BP: 109/42 (08/08 1200) Pulse Rate: 55 (08/08 1200)  Labs: Recent Labs    04/14/22 0742  CREATININE 1.31*    Estimated Creatinine Clearance: 47.3 mL/min (A) (by C-G formula based on SCr of 1.31 mg/dL (H)).   Medical History: Past Medical History:  Diagnosis Date   Arthritis    GERD (gastroesophageal reflux disease)    Hypertension    Phimosis    Pre-diabetes     Medications:  No history of chronic AC use PTA  Assessment: Pharmacy has been consulted to start aggrastat infusion on 86yo male s/p LT carotid artery stenosis. LT carotid stent placement completed 8/8. Per Vascular team, would like to have patient on aggrastat x 18 hours post-procedure.  Goal of Therapy:  Monitor platelets by anticoagulation protocol: Yes   Plan:  - Aggrastat bolus of 2,24mg x 1(infused over 3 minutes), followed by continuous infusion 402.311m/hr(~0.075mcg/kg/min x 89.4 kg) over 18 hours with stop date in place on 8/9'@0729'$ . - CBC ordered for tomorrow   WaPearla DubonnetPharmD Clinical Pharmacist 04/14/2022 1:58 PM

## 2022-04-14 NOTE — Interval H&P Note (Signed)
History and Physical Interval Note:  04/14/2022 8:16 AM  Ernest James.  has presented today for surgery, with the diagnosis of LT Carotid Stent Placement   LT Carotid Artery Stenosis   ABBOTT Rep.  The various methods of treatment have been discussed with the patient and family. After consideration of risks, benefits and other options for treatment, the patient has consented to  Procedure(s): CAROTID PTA/STENT INTERVENTION (Left) as a surgical intervention.  The patient's history has been reviewed, patient examined, no change in status, stable for surgery.  I have reviewed the patient's chart and labs.  Questions were answered to the patient's satisfaction.     [name]

## 2022-04-14 NOTE — Progress Notes (Signed)
Messaged Dr. Delana Meyer patient is having difficulty with speaking-using incorrect words and unable to get words out. Awaiting for orders. Continue to assess.

## 2022-04-14 NOTE — Progress Notes (Signed)
Daughter requested this RN to call Dr. Delana Meyer. She is concerned that patient is still having speech difficulties. Dr. Delana Meyer will come by and speak with her. Orders to start Aggrastat. Continue to assess.

## 2022-04-14 NOTE — Op Note (Signed)
OPERATIVE NOTE DATE: 04/14/2022  PROCEDURE:  Ultrasound guidance for vascular access right femoral artery  Placement of a 10 x 8 x 40 exact stent with the use of the NAV-6 embolic protection device in the left carotid artery  PRE-OPERATIVE DIAGNOSIS: 1.  Symptomatic greater than 80% left carotid artery stenosis. 2.  Recent TIA  POST-OPERATIVE DIAGNOSIS:  Same as above  SURGEON: [name]  ASSISTANT(S): None  ANESTHESIA: local/MCS  ESTIMATED BLOOD LOSS: 100 cc  CONTRAST: 60 cc  FLUORO TIME: 25.3 minutes  MODERATE CONSCIOUS SEDATION TIME: Continuous ECG pulse oximetry and cardiopulmonary monitoring was performed throughout the entire procedure by the interventional radiology nurse total sedation time was 1 hour 19 minutes and 20 seconds  FINDING(S): 1.   Greater than 80% left internal carotid artery stenosis in association with a bovine arch  SPECIMEN(S):   none  INDICATIONS:   Patient is a 86 y.o. male who presents with symptomatic left critical carotid artery stenosis.  The patient has a very high bifurcation and multiple comorbidities and carotid artery stenting was felt to be preferred to endarterectomy for that reason.  Risks and benefits were discussed and informed consent was obtained.   DESCRIPTION: After obtaining full informed written consent, the patient was brought back to the vascular suite and placed supine upon the table.  The patient received IV antibiotics prior to induction. Moderate conscious sedation was administered during a face to face encounter with the patient throughout the procedure with my supervision of the RN administering medicines and monitoring the patients vital signs and mental status throughout from the start of the procedure until the patient was taken to the recovery room.    After obtaining adequate sedation, the patient was prepped and draped in the standard fashion.    A first assistant is required in order to allow for a safe  and more efficient operation.  Duties include wire manipulations as well as assistance with pinning the sheath and positioning the detector for proper angle, assistance and deploying the stent in the proper position and appropriate images.  Further duties include assisting with patient positioning during the procedure.  I believe that this procedure requires a first assistant in order for it to be performed at a level in keeping with the high standards of this institution.  The right femoral artery was visualized with ultrasound and found to be widely patent. It was then accessed under direct ultrasound guidance without difficulty with a micropuncture needle. A permanent image was recorded.  A microwire was then advanced without difficulty under fluoroscopic guidance followed by a micro-sheath.  A J-wire was placed and we then placed a 6 French sheath. The patient was then heparinized and a total of 11,000 units of intravenous heparin were given and an ACT was checked to confirm successful anticoagulation.   A pigtail catheter was then placed into the ascending aorta. This showed bovine arch. The left common carotid artery was then selectively cannulated with moderate difficulty with a Simmons 1 catheter and the catheter advanced into the mid left common carotid artery.  Cervical and cerebral carotid angiography was then performed. There were no obvious intracranial filling defects. The carotid bifurcation demonstrated the lesion was right at the bifurcation extending into both the internal and external carotid arteries.  I then advanced into the external carotid artery with a Glidewire and the [facility] 1 catheter.  Just advancing the catheter caused the patient to become bradycardic and atropine was given.  I then exchanged the  stiff angled Glidewire for the Amplatz Super Stiff wire. Over the Amplatz Super Stiff wire, a 6 [place] shuttle sheath was placed into the mid common carotid artery. I then used the NAV-6   Embolic protection device and crossed the lesion and parked this in the distal internal carotid artery at the base of the skull.  I then selected a 10 x 8 x 40 Exact stent. This was deployed across the lesion encompassing it in its entirety. A 4.5 mm x 30 mm length balloon was used to post dilate the stent. Only about a 15% residual stenosis was present after angioplasty.  The patient's blood pressure dipped into the high 80s and low 90s at this point and he was treated with increased fluids as well as Neo-Synephrine boluses with resulting increase in his blood pressure to the low 013H systolic.  Given this fact I did not feel that treating him with a 5 mm balloon would be appropriate and move forward with completion angiogram that showed normal intracranial filling without new defects. At this point I elected to terminate the procedure. The sheath was removed and StarClose closure device was deployed in the right femoral artery with excellent hemostatic result. The patient was taken to the recovery room in stable condition having tolerated the procedure well.  COMPLICATIONS: none  CONDITION: stable  [name] 04/14/2022 9:53 AM   This note was created with Dragon Medical transcription system. Any errors in dictation are purely unintentional.

## 2022-04-14 NOTE — Progress Notes (Signed)
MRN : [id number]  [facility] Ernest James. is a 86 y.o. (Mar 27, 1936) male who presents with chief complaint of check carotid arteries.  History of Present Illness:   The patient presents for stenting of his carotid stenosis. CT scan was done 02/11/2022. Patient reports that the test went well with no problems or complications.    The patient denies interval amaurosis fugax. There is no recent or interval TIA symptoms or focal motor deficits. There is no prior documented CVA.   The patient is taking enteric-coated aspirin 81 mg daily.   There is no history of migraine headaches. There is no history of seizures.   No recent shortening of the patient's walking distance or new symptoms consistent with claudication.  No history of rest pain symptoms. No new ulcers or wounds of the lower extremities have occurred.   There is no history of DVT, PE or superficial thrombophlebitis. No recent episodes of angina or shortness of breath documented.    CT angiogram is reviewed by me personally and shows 80% stenosis of the proximal left ICA.  This is consistent with calcified plaque at the origin of the left internal carotid artery.  Also noted is a 50% mid  left common carotid artery as well as a bovine arch       Current Meds  Medication Sig   aspirin EC 81 MG tablet Take 81 mg by mouth daily.   atorvastatin (LIPITOR) 40 MG tablet Take 1 tablet by mouth once daily   Cholecalciferol (VITAMIN D3) 20 MCG TABS Take 1 tablet by mouth daily.    Cyanocobalamin (VITAMIN B12) 1000 MCG TBCR Take 1 tablet by mouth daily.   glucose blood (ONETOUCH VERIO) test strip Test TID. Use as instructed   hydrocortisone 2.5 % cream Apply topically.   ketoconazole (NIZORAL) 2 % shampoo Apply 1 application topically 2 (two) times a week.   Lancets (ONETOUCH ULTRASOFT) lancets Test TID as instructed   levothyroxine (SYNTHROID) 75 MCG tablet TAKE 1 TABLET BY MOUTH ONCE DAILY BEFORE BREAKFAST   losartan  (COZAAR) 50 MG tablet Take 1 tablet by mouth once daily   meloxicam (MOBIC) 7.5 MG tablet Take 1 tablet (7.5 mg total) by mouth daily as needed for pain.   Multiple Vitamins-Minerals (MULTIVITAMIN ADULT PO) Take 1 tablet by mouth daily.    nystatin cream (MYCOSTATIN) Apply topically 2 (two) times daily.   tamsulosin (FLOMAX) 0.4 MG CAPS capsule Take 1 capsule (0.4 mg total) by mouth 2 (two) times daily.   triamcinolone cream (KENALOG) 0.1 % APPLY TO AFFECTED AREAS TWICE DAILY UNTIL IMPROVED THEN DISCONTINUE   triamterene-hydrochlorothiazide (MAXZIDE) 75-50 MG tablet Take 1 tablet by mouth once daily    Past Medical History:  Diagnosis Date   Arthritis    GERD (gastroesophageal reflux disease)    Hypertension    Phimosis    Pre-diabetes     Past Surgical History:  Procedure Laterality Date   CIRCUMCISION     COLONOSCOPY WITH PROPOFOL N/A 03/31/2016   Procedure: COLONOSCOPY WITH PROPOFOL;  Surgeon: Lollie Sails, MD;  Location: [facility] ENDOSCOPY;  Service: Endoscopy;  Laterality: N/A;   MRI     SINUS SURGERY  1994   on left side   TONSILLECTOMY      Social History Social History   Tobacco Use   Smoking status: Never   Smokeless tobacco: Never  Vaping Use   Vaping Use: Never used  Substance Use Topics   Alcohol use: No  Alcohol/week: 0.0 standard drinks of alcohol   Drug use: No    Family History Family History  Problem Relation Age of Onset   Alzheimer's disease Mother    CAD Father    Diabetes Sister        type 2    No Known Allergies   REVIEW OF SYSTEMS (Negative unless checked)  Constitutional: '[]'$ Weight loss  '[]'$ Fever  '[]'$ Chills Cardiac: '[]'$ Chest pain   '[]'$ Chest pressure   '[]'$ Palpitations   '[]'$ Shortness of breath when laying flat   '[]'$ Shortness of breath with exertion. Vascular:  '[x]'$ Pain in legs with walking   '[]'$ Pain in legs at rest  '[]'$ History of DVT   '[]'$ Phlebitis   '[]'$ Swelling in legs   '[]'$ Varicose veins   '[]'$ Non-healing ulcers Pulmonary:   '[]'$ Uses home oxygen    '[]'$ Productive cough   '[]'$ Hemoptysis   '[]'$ Wheeze  '[]'$ COPD   '[]'$ Asthma Neurologic:  '[]'$ Dizziness   '[]'$ Seizures   '[]'$ History of stroke   '[x]'$ History of TIA  '[]'$ Aphasia   '[]'$ Vissual changes   '[]'$ Weakness or numbness in arm   '[]'$ Weakness or numbness in leg Musculoskeletal:   '[]'$ Joint swelling   '[]'$ Joint pain   '[]'$ Low back pain Hematologic:  '[]'$ Easy bruising  '[]'$ Easy bleeding   '[]'$ Hypercoagulable state   '[]'$ Anemic Gastrointestinal:  '[]'$ Diarrhea   '[]'$ Vomiting  '[]'$ Gastroesophageal reflux/heartburn   '[]'$ Difficulty swallowing. Genitourinary:  '[]'$ Chronic kidney disease   '[]'$ Difficult urination  '[]'$ Frequent urination   '[]'$ Blood in urine Skin:  '[]'$ Rashes   '[]'$ Ulcers  Psychological:  '[]'$ History of anxiety   '[]'$  History of major depression.  Physical Examination  Vitals:   04/14/22 0724  BP: (!) 174/70  Pulse: 70  Resp: 13  Temp: 97.8 F (36.6 C)  TempSrc: Oral  SpO2: 94%  Weight: 90.3 kg  Height: '5\' 10"'$  (1.778 m)   Body mass index is 28.55 kg/m. Gen: WD/WN, NAD Head: [place]/AT, No temporalis wasting.  Ear/Nose/Throat: Hearing grossly intact, nares w/o erythema or drainage Eyes: PER, EOMI, sclera nonicteric.  Neck: Supple, no masses.  No bruit or JVD.  Pulmonary:  Good air movement, no audible wheezing, no use of accessory muscles.  Cardiac: RRR, normal S1, S2, no Murmurs. Vascular:  carotid bruit noted left Vessel Right Left  Radial Palpable Palpable  Carotid  Palpable  Palpable  Subclav  Palpable Palpable  Gastrointestinal: soft, non-distended. No guarding/no peritoneal signs.  Musculoskeletal: M/S 5/5 throughout.  No visible deformity.  Neurologic: CN 2-12 intact. Pain and light touch intact in extremities.  Symmetrical.  Speech is fluent. Motor exam as listed above. Psychiatric: Judgment intact, Mood & affect appropriate for pt's clinical situation. Dermatologic: No rashes or ulcers noted.  No changes consistent with cellulitis.   CBC Lab Results  Component Value Date   WBC 8.0 01/09/2022   HGB 14.5 01/09/2022    HCT 43.0 01/09/2022   MCV 91.3 01/09/2022   PLT 276 01/09/2022    BMET    Component Value Date/Time   NA 136 02/06/2022 1617   K 3.9 02/06/2022 1617   CL 97 02/06/2022 1617   CO2 22 02/06/2022 1617   GLUCOSE 116 (H) 02/06/2022 1617   GLUCOSE 136 (H) 01/09/2022 1058   BUN 24 (H) 04/14/2022 0742   BUN 15 02/06/2022 1617   CREATININE 1.31 (H) 04/14/2022 0742   CALCIUM 10.2 02/06/2022 1617   GFRNONAA 54 (L) 04/14/2022 0742   GFRAA >60 12/23/2019 1352   Estimated Creatinine Clearance: 47.4 mL/min (A) (by C-G formula based on SCr of 1.31 mg/dL (H)).  COAG No results found  for: "INR", "PROTIME"  Radiology VAS US RENAL ARTERY DUPLEX  Result Date: 04/07/2022 ABDOMINAL VISCERAL Patient Name:  [facility] Larrell Rapozo.  Date of Exam:   04/07/2022 Medical Rec #: [id number]                      Accession #:    [id number] Date of Birth: Jul 28, 1936                      Patient Gender: M Patient Age:   62 years Exam Location:  [facility] Procedure:      VAS US RENAL ARTERY DUPLEX Referring Phys: [id number] BRIAN AGBOR-ETANG -------------------------------------------------------------------------------- Indications: Hypertension High Risk Factors: Hyperlipidemia, Diabetes, no history of smoking. Other Factors: OSA                Family h/o ASCVD disease. Comparison Study: None Performing Technologist: Pilar Jarvis RDMS, RVT, RDCS  Examination Guidelines: A complete evaluation includes B-mode imaging, spectral Doppler, color Doppler, and power Doppler as needed of all accessible portions of each vessel. Bilateral testing is considered an integral part of a complete examination. Limited examinations for reoccurring indications may be performed as noted.  Duplex Findings: +--------------------+--------+--------+------+------------+ Mesenteric          PSV cm/sEDV cm/sPlaque  Comments   +--------------------+--------+--------+------+------------+ Aorta Prox            108      17         2.0 x 1.9 cm  +--------------------+--------+--------+------+------------+ Aorta Mid              91      21                      +--------------------+--------+--------+------+------------+ Aorta Distal          102                              +--------------------+--------+--------+------+------------+ Celiac Artery Origin  361                              +--------------------+--------+--------+------+------------+ SMA Proximal          375      68                      +--------------------+--------+--------+------+------------+ SMA Mid               237      31                      +--------------------+--------+--------+------+------------+    +------------------+--------+--------+-------+ Right Renal ArteryPSV cm/sEDV cm/sComment +------------------+--------+--------+-------+ Origin              263      41           +------------------+--------+--------+-------+ Proximal            296      36           +------------------+--------+--------+-------+ Mid                 238      48           +------------------+--------+--------+-------+ Distal               74      17           +------------------+--------+--------+-------+ +-----------------+--------+--------+-------+  Left Renal ArteryPSV cm/sEDV cm/sComment +-----------------+--------+--------+-------+ Origin             160      25           +-----------------+--------+--------+-------+ Proximal           144      26           +-----------------+--------+--------+-------+ Mid                143      33           +-----------------+--------+--------+-------+ Distal              71      14           +-----------------+--------+--------+-------+ +------------+--------+--------+----+-----------+--------+--------+----+ Right KidneyPSV cm/sEDV cm/sRI  Left KidneyPSV cm/sEDV cm/sRI   +------------+--------+--------+----+-----------+--------+--------+----+ Upper Pole  22      7        0.70Upper Pole 22      6       0.72 +------------+--------+--------+----+-----------+--------+--------+----+ Mid         24      6       0.75Mid        28      7       0.77 +------------+--------+--------+----+-----------+--------+--------+----+ Lower Pole  21      7       0.67Lower Pole 24      8       0.66 +------------+--------+--------+----+-----------+--------+--------+----+ Hilar       33      8       0.75Hilar      39      9       0.77 +------------+--------+--------+----+-----------+--------+--------+----+ +------------------+--------+------------------+--------+ Right Kidney              Left Kidney                +------------------+--------+------------------+--------+ RAR                       RAR                        +------------------+--------+------------------+--------+ RAR (manual)      2.74    RAR (manual)      1.48     +------------------+--------+------------------+--------+ Cortex                    Cortex                     +------------------+--------+------------------+--------+ Cortex thickness  11.00 mmCorex thickness   12.00 mm +------------------+--------+------------------+--------+ Kidney length (cm)11.47   Kidney length (cm)11.83    +------------------+--------+------------------+--------+  Summary: Largest Aortic Diameter: 2.0 cm  Renal:  Right: Normal size right kidney. Normal right Resisitive Index.        Normal cortical thickness of right kidney. Evidence of a >        60% stenosis of the right renal artery. RRV flow present. Left:  1-59% stenosis of the left renal artery. LRV flow present.        Normal size of left kidney. Normal left Resistive Index.        Normal cortical thickness of the left kidney. 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  *See table(s) above for measurements and observations.  Vascular consult recommended. Diagnosing physician: Larae Grooms MD   Electronically signed by Conception Oms  Varanasi MD on 04/07/2022 at 1:18:10 PM.    Final    ECHOCARDIOGRAM COMPLETE  Result Date: 04/07/2022    ECHOCARDIOGRAM REPORT   Patient Name:   [facility] Demarius Archila. Date of Exam: 04/07/2022 Medical Rec #:  [id number]                     Height:       70.0 in Accession #:    [id number]                    Weight:       198.0 lb Date of Birth:  1935/09/21                     BSA:          2.078 m Patient Age:    52 years                      BP:           148/84 mmHg Patient Gender: M                             HR:           55 bpm. Exam Location:  [facility] Procedure: 2D Echo, Cardiac Doppler and Color Doppler Indications:    Z82.49 Family history of ischemic heart disease                 Elevated troponins  History:        Patient has no prior history of Echocardiogram examinations.                 Elevated troponins, PAD, Carotid Disease and TIA, Arrythmias:AV                 block; Risk Factors:Hypertension, Diabetes, Dyslipidemia,                 Non-Smoker, Sleep Apnea and Family History of Coronary Artery                 Disease.  Sonographer:    Pilar Jarvis RDMS, RVT, RDCS Referring Phys: [id number] [facility]  Sonographer Comments: Limited acoustic windows and hyperinflated lungs IMPRESSIONS  1. Left ventricular ejection fraction, by estimation, is 60 to 65%. The left ventricle has normal function. The left ventricle has no regional wall motion abnormalities. There is mild left ventricular hypertrophy. Left ventricular diastolic parameters are consistent with Grade I diastolic dysfunction (impaired relaxation).  2. Right ventricular systolic function is normal. The right ventricular size is normal. Tricuspid regurgitation signal is inadequate for assessing PA pressure.  3. The mitral valve is normal in structure. Mild mitral valve regurgitation. No evidence of mitral stenosis.  4. The aortic valve was not well visualized. Aortic valve regurgitation is mild. No  aortic stenosis is present.  5. There is borderline dilatation of the aortic root, measuring 36 mm.  6. The inferior vena cava is normal in size with greater than 50% respiratory variability, suggesting right atrial pressure of 3 mmHg. FINDINGS  Left Ventricle: Left ventricular ejection fraction, by estimation, is 60 to 65%. The left ventricle has normal function. The left ventricle has no regional wall motion abnormalities. The left ventricular internal cavity size was normal in size. There is  mild left ventricular hypertrophy. Left ventricular diastolic parameters are consistent with Grade I diastolic dysfunction (impaired  relaxation). Right Ventricle: The right ventricular size is normal. No increase in right ventricular wall thickness. Right ventricular systolic function is normal. Tricuspid regurgitation signal is inadequate for assessing PA pressure. Left Atrium: Left atrial size was normal in size. Right Atrium: Right atrial size was normal in size. Pericardium: There is no evidence of pericardial effusion. Mitral Valve: The mitral valve is normal in structure. There is mild calcification of the mitral valve leaflet(s). Mild mitral valve regurgitation. No evidence of mitral valve stenosis. Tricuspid Valve: The tricuspid valve is normal in structure. Tricuspid valve regurgitation is not demonstrated. No evidence of tricuspid stenosis. Aortic Valve: The aortic valve was not well visualized. Aortic valve regurgitation is mild. Aortic regurgitation PHT measures 746 msec. No aortic stenosis is present. Aortic valve mean gradient measures 2.0 mmHg. Aortic valve peak gradient measures 4.7 mmHg. Aortic valve area, by VTI measures 3.21 cm. Pulmonic Valve: The pulmonic valve was normal in structure. Pulmonic valve regurgitation is not visualized. No evidence of pulmonic stenosis. Aorta: The aortic root is normal in size and structure. There is borderline dilatation of the aortic root, measuring 36 mm. Venous: The  inferior vena cava is normal in size with greater than 50% respiratory variability, suggesting right atrial pressure of 3 mmHg. IAS/Shunts: No atrial level shunt detected by color flow Doppler.  LEFT VENTRICLE PLAX 2D LVIDd:         4.20 cm      Diastology LVIDs:         2.80 cm      LV e' medial:    5.11 cm/s LV PW:         1.30 cm      LV E/e' medial:  10.0 LV IVS:        1.30 cm      LV e' lateral:   5.11 cm/s LVOT diam:     2.10 cm      LV E/e' lateral: 10.0 LV SV:         82 LV SV Index:   40 LVOT Area:     3.46 cm  LV Volumes (MOD) LV vol d, MOD A2C: 91.7 ml LV vol d, MOD A4C: 122.0 ml LV vol s, MOD A2C: 52.4 ml LV vol s, MOD A4C: 56.6 ml LV SV MOD A2C:     39.4 ml LV SV MOD A4C:     122.0 ml LV SV MOD BP:      51.7 ml RIGHT VENTRICLE RV Basal diam:  3.70 cm RV S prime:     10.40 cm/s RVOT diam:      3.30 cm TAPSE (M-mode): 2.0 cm LEFT ATRIUM             Index        RIGHT ATRIUM          Index LA diam:        3.80 cm 1.83 cm/m   RA Area:     8.03 cm LA Vol (A2C):   40.9 ml 19.68 ml/m  RA Volume:   15.30 ml 7.36 ml/m LA Vol (A4C):   55.1 ml 26.51 ml/m LA Biplane Vol: 49.3 ml 23.72 ml/m  AORTIC VALVE                    PULMONIC VALVE AV Area (Vmax):    2.93 cm     PV Vmax:       1.02 m/s AV Area (Vmean):   3.18 cm  PV Peak grad:  4.2 mmHg AV Area (VTI):     3.21 cm AV Vmax:           108.00 cm/s AV Vmean:          74.000 cm/s AV VTI:            0.257 m AV Peak Grad:      4.7 mmHg AV Mean Grad:      2.0 mmHg LVOT Vmax:         91.40 cm/s LVOT Vmean:        67.900 cm/s LVOT VTI:          0.238 m LVOT/AV VTI ratio: 0.93 AI PHT:            746 msec  AORTA Ao Root diam: 3.70 cm Ao Asc diam:  3.60 cm MITRAL VALVE MV Area (PHT): 2.43 cm    SHUNTS MV Decel Time: 312 msec    Systemic VTI:  0.24 m MV E velocity: 51.00 cm/s  Systemic Diam: 2.10 cm MV A velocity: 75.40 cm/s  Pulmonic Diam: 3.30 cm MV E/A ratio:  0.68 Ida Rogue MD Electronically signed by Ida Rogue MD Signature Date/Time:  04/07/2022/1:04:43 PM    Final      Assessment/Plan 1. Bilateral carotid artery stenosis Recommend:   The patient is symptomatic with respect to the carotid stenosis.  The patient now has progressed and has a lesion the is >80%.   Patient's CT angiography of the carotid arteries confirms >80% left ICA stenosis.  The anatomical considerations support stenting over surgery.  This was discussed in detail with the patient.   The risks, benefits and alternative therapies were reviewed in detail with the patient.  All questions were answered.  The patient agrees to proceed with stenting of the left carotid artery.   Continue antiplatelet therapy as prescribed. Continue management of CAD, HTN and Hyperlipidemia. Healthy heart diet, encouraged exercise at least 4 times per week.     2. TIA (transient ischemic attack) See #1   3. Primary hypertension Continue antihypertensive medications as already ordered, these medications have been reviewed and there are no changes at this time.    4. Pure hyperglyceridemia Continue statin as ordered and reviewed, no changes at this time    [name], MD  04/14/2022 8:13 AM

## 2022-04-14 NOTE — Progress Notes (Signed)
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 Charge RN in room, daughter is concerned. Per Dr. Delana Meyer ordered CT scan. Continue to assess.

## 2022-04-14 NOTE — H&P (View-Only) (Signed)
MRN : [id number]  [facility] Ernest James. is a 86 y.o. ([birth year]) male who presents with chief complaint of check carotid arteries.  History of Present Illness:   The patient presents for stenting of his carotid stenosis. CT scan was done 02/11/2022. Patient reports that the test went well with no problems or complications.    The patient denies interval amaurosis fugax. There is no recent or interval TIA symptoms or focal motor deficits. There is no prior documented CVA.   The patient is taking enteric-coated aspirin 81 mg daily.   There is no history of migraine headaches. There is no history of seizures.   No recent shortening of the patient's walking distance or new symptoms consistent with claudication.  No history of rest pain symptoms. No new ulcers or wounds of the lower extremities have occurred.   There is no history of DVT, PE or superficial thrombophlebitis. No recent episodes of angina or shortness of breath documented.    CT angiogram is reviewed by me personally and shows 80% stenosis of the proximal left ICA.  This is consistent with calcified plaque at the origin of the left internal carotid artery.  Also noted is a 50% mid  left common carotid artery as well as a bovine arch       Current Meds  Medication Sig   aspirin EC 81 MG tablet Take 81 mg by mouth daily.   atorvastatin (LIPITOR) 40 MG tablet Take 1 tablet by mouth once daily   Cholecalciferol (VITAMIN D3) 20 MCG TABS Take 1 tablet by mouth daily.    Cyanocobalamin (VITAMIN B12) 1000 MCG TBCR Take 1 tablet by mouth daily.   glucose blood (ONETOUCH VERIO) test strip Test TID. Use as instructed   hydrocortisone 2.5 % cream Apply topically.   ketoconazole (NIZORAL) 2 % shampoo Apply 1 application topically 2 (two) times a week.   Lancets (ONETOUCH ULTRASOFT) lancets Test TID as instructed   levothyroxine (SYNTHROID) 75 MCG tablet TAKE 1 TABLET BY MOUTH ONCE DAILY BEFORE BREAKFAST   losartan  (COZAAR) 50 MG tablet Take 1 tablet by mouth once daily   meloxicam (MOBIC) 7.5 MG tablet Take 1 tablet (7.5 mg total) by mouth daily as needed for pain.   Multiple Vitamins-Minerals (MULTIVITAMIN ADULT PO) Take 1 tablet by mouth daily.    nystatin cream (MYCOSTATIN) Apply topically 2 (two) times daily.   tamsulosin (FLOMAX) 0.4 MG CAPS capsule Take 1 capsule (0.4 mg total) by mouth 2 (two) times daily.   triamcinolone cream (KENALOG) 0.1 % APPLY TO AFFECTED AREAS TWICE DAILY UNTIL IMPROVED THEN DISCONTINUE   triamterene-hydrochlorothiazide (MAXZIDE) 75-50 MG tablet Take 1 tablet by mouth once daily    Past Medical History:  Diagnosis Date   Arthritis    GERD (gastroesophageal reflux disease)    Hypertension    Phimosis    Pre-diabetes     Past Surgical History:  Procedure Laterality Date   CIRCUMCISION     COLONOSCOPY WITH PROPOFOL N/A 03/31/2016   Procedure: COLONOSCOPY WITH PROPOFOL;  Surgeon: Lollie Sails, MD;  Location: [facility] ENDOSCOPY;  Service: Endoscopy;  Laterality: N/A;   MRI     SINUS SURGERY  1994   on left side   TONSILLECTOMY      Social History Social History   Tobacco Use   Smoking status: Never   Smokeless tobacco: Never  Vaping Use   Vaping Use: Never used  Substance Use Topics   Alcohol use: No  Alcohol/week: 0.0 standard drinks of alcohol   Drug use: No    Family History Family History  Problem Relation Age of Onset   Alzheimer's disease Mother    CAD Father    Diabetes Sister        type 2    No Known Allergies   REVIEW OF SYSTEMS (Negative unless checked)  Constitutional: '[]'$ Weight loss  '[]'$ Fever  '[]'$ Chills Cardiac: '[]'$ Chest pain   '[]'$ Chest pressure   '[]'$ Palpitations   '[]'$ Shortness of breath when laying flat   '[]'$ Shortness of breath with exertion. Vascular:  '[x]'$ Pain in legs with walking   '[]'$ Pain in legs at rest  '[]'$ History of DVT   '[]'$ Phlebitis   '[]'$ Swelling in legs   '[]'$ Varicose veins   '[]'$ Non-healing ulcers Pulmonary:   '[]'$ Uses home oxygen    '[]'$ Productive cough   '[]'$ Hemoptysis   '[]'$ Wheeze  '[]'$ COPD   '[]'$ Asthma Neurologic:  '[]'$ Dizziness   '[]'$ Seizures   '[]'$ History of stroke   '[x]'$ History of TIA  '[]'$ Aphasia   '[]'$ Vissual changes   '[]'$ Weakness or numbness in arm   '[]'$ Weakness or numbness in leg Musculoskeletal:   '[]'$ Joint swelling   '[]'$ Joint pain   '[]'$ Low back pain Hematologic:  '[]'$ Easy bruising  '[]'$ Easy bleeding   '[]'$ Hypercoagulable state   '[]'$ Anemic Gastrointestinal:  '[]'$ Diarrhea   '[]'$ Vomiting  '[]'$ Gastroesophageal reflux/heartburn   '[]'$ Difficulty swallowing. Genitourinary:  '[]'$ Chronic kidney disease   '[]'$ Difficult urination  '[]'$ Frequent urination   '[]'$ Blood in urine Skin:  '[]'$ Rashes   '[]'$ Ulcers  Psychological:  '[]'$ History of anxiety   '[]'$  History of major depression.  Physical Examination  Vitals:   04/14/22 0724  BP: (!) 174/70  Pulse: 70  Resp: 13  Temp: 97.8 F (36.6 C)  TempSrc: Oral  SpO2: 94%  Weight: 90.3 kg  Height: '5\' 10"'$  (1.778 m)   Body mass index is 28.55 kg/m. Gen: WD/WN, NAD Head: [place]/AT, No temporalis wasting.  Ear/Nose/Throat: Hearing grossly intact, nares w/o erythema or drainage Eyes: PER, EOMI, sclera nonicteric.  Neck: Supple, no masses.  No bruit or JVD.  Pulmonary:  Good air movement, no audible wheezing, no use of accessory muscles.  Cardiac: RRR, normal S1, S2, no Murmurs. Vascular:  carotid bruit noted left Vessel Right Left  Radial Palpable Palpable  Carotid  Palpable  Palpable  Subclav  Palpable Palpable  Gastrointestinal: soft, non-distended. No guarding/no peritoneal signs.  Musculoskeletal: M/S 5/5 throughout.  No visible deformity.  Neurologic: CN 2-12 intact. Pain and light touch intact in extremities.  Symmetrical.  Speech is fluent. Motor exam as listed above. Psychiatric: Judgment intact, Mood & affect appropriate for pt's clinical situation. Dermatologic: No rashes or ulcers noted.  No changes consistent with cellulitis.   CBC Lab Results  Component Value Date   WBC 8.0 01/09/2022   HGB 14.5 01/09/2022    HCT 43.0 01/09/2022   MCV 91.3 01/09/2022   PLT 276 01/09/2022    BMET    Component Value Date/Time   NA 136 02/06/2022 1617   K 3.9 02/06/2022 1617   CL 97 02/06/2022 1617   CO2 22 02/06/2022 1617   GLUCOSE 116 (H) 02/06/2022 1617   GLUCOSE 136 (H) 01/09/2022 1058   BUN 24 (H) 04/14/2022 0742   BUN 15 02/06/2022 1617   CREATININE 1.31 (H) 04/14/2022 0742   CALCIUM 10.2 02/06/2022 1617   GFRNONAA 54 (L) 04/14/2022 0742   GFRAA >60 12/23/2019 1352   Estimated Creatinine Clearance: 47.4 mL/min (A) (by C-G formula based on SCr of 1.31 mg/dL (H)).  COAG No results found  for: "INR", "PROTIME"  Radiology VAS US RENAL ARTERY DUPLEX  Result Date: 04/07/2022 ABDOMINAL VISCERAL Patient Name:  [facility] Emric Kowalewski.  Date of Exam:   04/07/2022 Medical Rec #: [id number]                      Accession #:    [id number] Date of Birth: 1936/02/27                      Patient Gender: M Patient Age:   89 years Exam Location:  [place] Procedure:      VAS US RENAL ARTERY DUPLEX Referring Phys: [id number] BRIAN AGBOR-ETANG -------------------------------------------------------------------------------- Indications: Hypertension High Risk Factors: Hyperlipidemia, Diabetes, no history of smoking. Other Factors: OSA                Family h/o ASCVD disease. Comparison Study: None Performing Technologist: Pilar Jarvis RDMS, RVT, RDCS  Examination Guidelines: A complete evaluation includes B-mode imaging, spectral Doppler, color Doppler, and power Doppler as needed of all accessible portions of each vessel. Bilateral testing is considered an integral part of a complete examination. Limited examinations for reoccurring indications may be performed as noted.  Duplex Findings: +--------------------+--------+--------+------+------------+ Mesenteric          PSV cm/sEDV cm/sPlaque  Comments   +--------------------+--------+--------+------+------------+ Aorta Prox            108      17         2.0 x 1.9 cm  +--------------------+--------+--------+------+------------+ Aorta Mid              91      21                      +--------------------+--------+--------+------+------------+ Aorta Distal          102                              +--------------------+--------+--------+------+------------+ Celiac Artery Origin  361                              +--------------------+--------+--------+------+------------+ SMA Proximal          375      68                      +--------------------+--------+--------+------+------------+ SMA Mid               237      31                      +--------------------+--------+--------+------+------------+    +------------------+--------+--------+-------+ Right Renal ArteryPSV cm/sEDV cm/sComment +------------------+--------+--------+-------+ Origin              263      41           +------------------+--------+--------+-------+ Proximal            296      36           +------------------+--------+--------+-------+ Mid                 238      48           +------------------+--------+--------+-------+ Distal               74      17           +------------------+--------+--------+-------+ +-----------------+--------+--------+-------+  Left Renal ArteryPSV cm/sEDV cm/sComment +-----------------+--------+--------+-------+ Origin             160      25           +-----------------+--------+--------+-------+ Proximal           144      26           +-----------------+--------+--------+-------+ Mid                143      33           +-----------------+--------+--------+-------+ Distal              71      14           +-----------------+--------+--------+-------+ +------------+--------+--------+----+-----------+--------+--------+----+ Right KidneyPSV cm/sEDV cm/sRI  Left KidneyPSV cm/sEDV cm/sRI   +------------+--------+--------+----+-----------+--------+--------+----+ Upper Pole  22      7        0.70Upper Pole 22      6       0.72 +------------+--------+--------+----+-----------+--------+--------+----+ Mid         24      6       0.75Mid        28      7       0.77 +------------+--------+--------+----+-----------+--------+--------+----+ Lower Pole  21      7       0.67Lower Pole 24      8       0.66 +------------+--------+--------+----+-----------+--------+--------+----+ Hilar       33      8       0.75Hilar      39      9       0.77 +------------+--------+--------+----+-----------+--------+--------+----+ +------------------+--------+------------------+--------+ Right Kidney              Left Kidney                +------------------+--------+------------------+--------+ RAR                       RAR                        +------------------+--------+------------------+--------+ RAR (manual)      2.74    RAR (manual)      1.48     +------------------+--------+------------------+--------+ Cortex                    Cortex                     +------------------+--------+------------------+--------+ Cortex thickness  11.00 mmCorex thickness   12.00 mm +------------------+--------+------------------+--------+ Kidney length (cm)11.47   Kidney length (cm)11.83    +------------------+--------+------------------+--------+  Summary: Largest Aortic Diameter: 2.0 cm  Renal:  Right: Normal size right kidney. Normal right Resisitive Index.        Normal cortical thickness of right kidney. Evidence of a >        60% stenosis of the right renal artery. RRV flow present. Left:  1-59% stenosis of the left renal artery. LRV flow present.        Normal size of left kidney. Normal left Resistive Index.        Normal cortical thickness of the left kidney. 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  *See table(s) above for measurements and observations.  Vascular consult recommended. Diagnosing physician: Larae Grooms MD   Electronically signed by Conception Oms  Varanasi MD on 04/07/2022 at 1:18:10 PM.    Final    ECHOCARDIOGRAM COMPLETE  Result Date: 04/07/2022    ECHOCARDIOGRAM REPORT   Patient Name:   [facility] Hunner Garcon. Date of Exam: 04/07/2022 Medical Rec #:  [id number]                     Height:       70.0 in Accession #:    [id number]                    Weight:       198.0 lb Date of Birth:  02/10/1936                     BSA:          2.078 m Patient Age:    77 years                      BP:           148/84 mmHg Patient Gender: M                             HR:           55 bpm. Exam Location:  [place] Procedure: 2D Echo, Cardiac Doppler and Color Doppler Indications:    Z82.49 Family history of ischemic heart disease                 Elevated troponins  History:        Patient has no prior history of Echocardiogram examinations.                 Elevated troponins, PAD, Carotid Disease and TIA, Arrythmias:AV                 block; Risk Factors:Hypertension, Diabetes, Dyslipidemia,                 Non-Smoker, Sleep Apnea and Family History of Coronary Artery                 Disease.  Sonographer:    Pilar Jarvis RDMS, RVT, RDCS Referring Phys: [id number] [facility]  Sonographer Comments: Limited acoustic windows and hyperinflated lungs IMPRESSIONS  1. Left ventricular ejection fraction, by estimation, is 60 to 65%. The left ventricle has normal function. The left ventricle has no regional wall motion abnormalities. There is mild left ventricular hypertrophy. Left ventricular diastolic parameters are consistent with Grade I diastolic dysfunction (impaired relaxation).  2. Right ventricular systolic function is normal. The right ventricular size is normal. Tricuspid regurgitation signal is inadequate for assessing PA pressure.  3. The mitral valve is normal in structure. Mild mitral valve regurgitation. No evidence of mitral stenosis.  4. The aortic valve was not well visualized. Aortic valve regurgitation is mild. No  aortic stenosis is present.  5. There is borderline dilatation of the aortic root, measuring 36 mm.  6. The inferior vena cava is normal in size with greater than 50% respiratory variability, suggesting right atrial pressure of 3 mmHg. FINDINGS  Left Ventricle: Left ventricular ejection fraction, by estimation, is 60 to 65%. The left ventricle has normal function. The left ventricle has no regional wall motion abnormalities. The left ventricular internal cavity size was normal in size. There is  mild left ventricular hypertrophy. Left ventricular diastolic parameters are consistent with Grade I diastolic dysfunction (impaired  relaxation). Right Ventricle: The right ventricular size is normal. No increase in right ventricular wall thickness. Right ventricular systolic function is normal. Tricuspid regurgitation signal is inadequate for assessing PA pressure. Left Atrium: Left atrial size was normal in size. Right Atrium: Right atrial size was normal in size. Pericardium: There is no evidence of pericardial effusion. Mitral Valve: The mitral valve is normal in structure. There is mild calcification of the mitral valve leaflet(s). Mild mitral valve regurgitation. No evidence of mitral valve stenosis. Tricuspid Valve: The tricuspid valve is normal in structure. Tricuspid valve regurgitation is not demonstrated. No evidence of tricuspid stenosis. Aortic Valve: The aortic valve was not well visualized. Aortic valve regurgitation is mild. Aortic regurgitation PHT measures 746 msec. No aortic stenosis is present. Aortic valve mean gradient measures 2.0 mmHg. Aortic valve peak gradient measures 4.7 mmHg. Aortic valve area, by VTI measures 3.21 cm. Pulmonic Valve: The pulmonic valve was normal in structure. Pulmonic valve regurgitation is not visualized. No evidence of pulmonic stenosis. Aorta: The aortic root is normal in size and structure. There is borderline dilatation of the aortic root, measuring 36 mm. Venous: The  inferior vena cava is normal in size with greater than 50% respiratory variability, suggesting right atrial pressure of 3 mmHg. IAS/Shunts: No atrial level shunt detected by color flow Doppler.  LEFT VENTRICLE PLAX 2D LVIDd:         4.20 cm      Diastology LVIDs:         2.80 cm      LV e' medial:    5.11 cm/s LV PW:         1.30 cm      LV E/e' medial:  10.0 LV IVS:        1.30 cm      LV e' lateral:   5.11 cm/s LVOT diam:     2.10 cm      LV E/e' lateral: 10.0 LV SV:         82 LV SV Index:   40 LVOT Area:     3.46 cm  LV Volumes (MOD) LV vol d, MOD A2C: 91.7 ml LV vol d, MOD A4C: 122.0 ml LV vol s, MOD A2C: 52.4 ml LV vol s, MOD A4C: 56.6 ml LV SV MOD A2C:     39.4 ml LV SV MOD A4C:     122.0 ml LV SV MOD BP:      51.7 ml RIGHT VENTRICLE RV Basal diam:  3.70 cm RV S prime:     10.40 cm/s RVOT diam:      3.30 cm TAPSE (M-mode): 2.0 cm LEFT ATRIUM             Index        RIGHT ATRIUM          Index LA diam:        3.80 cm 1.83 cm/m   RA Area:     8.03 cm LA Vol (A2C):   40.9 ml 19.68 ml/m  RA Volume:   15.30 ml 7.36 ml/m LA Vol (A4C):   55.1 ml 26.51 ml/m LA Biplane Vol: 49.3 ml 23.72 ml/m  AORTIC VALVE                    PULMONIC VALVE AV Area (Vmax):    2.93 cm     PV Vmax:       1.02 m/s AV Area (Vmean):   3.18 cm  PV Peak grad:  4.2 mmHg AV Area (VTI):     3.21 cm AV Vmax:           108.00 cm/s AV Vmean:          74.000 cm/s AV VTI:            0.257 m AV Peak Grad:      4.7 mmHg AV Mean Grad:      2.0 mmHg LVOT Vmax:         91.40 cm/s LVOT Vmean:        67.900 cm/s LVOT VTI:          0.238 m LVOT/AV VTI ratio: 0.93 AI PHT:            746 msec  AORTA Ao Root diam: 3.70 cm Ao Asc diam:  3.60 cm MITRAL VALVE MV Area (PHT): 2.43 cm    SHUNTS MV Decel Time: 312 msec    Systemic VTI:  0.24 m MV E velocity: 51.00 cm/s  Systemic Diam: 2.10 cm MV A velocity: 75.40 cm/s  Pulmonic Diam: 3.30 cm MV E/A ratio:  0.68 Ida Rogue MD Electronically signed by Ida Rogue MD Signature Date/Time:  04/07/2022/1:04:43 PM    Final      Assessment/Plan 1. Bilateral carotid artery stenosis Recommend:   The patient is symptomatic with respect to the carotid stenosis.  The patient now has progressed and has a lesion the is >80%.   Patient's CT angiography of the carotid arteries confirms >80% left ICA stenosis.  The anatomical considerations support stenting over surgery.  This was discussed in detail with the patient.   The risks, benefits and alternative therapies were reviewed in detail with the patient.  All questions were answered.  The patient agrees to proceed with stenting of the left carotid artery.   Continue antiplatelet therapy as prescribed. Continue management of CAD, HTN and Hyperlipidemia. Healthy heart diet, encouraged exercise at least 4 times per week.     2. TIA (transient ischemic attack) See #1   3. Primary hypertension Continue antihypertensive medications as already ordered, these medications have been reviewed and there are no changes at this time.    4. Pure hyperglyceridemia Continue statin as ordered and reviewed, no changes at this time    [name], MD  04/14/2022 8:13 AM

## 2022-04-15 ENCOUNTER — Encounter: Payer: Self-pay | Admitting: Vascular Surgery

## 2022-04-15 DIAGNOSIS — I442 Atrioventricular block, complete: Secondary | ICD-10-CM | POA: Diagnosis not present

## 2022-04-15 DIAGNOSIS — Z9889 Other specified postprocedural states: Secondary | ICD-10-CM

## 2022-04-15 DIAGNOSIS — I6521 Occlusion and stenosis of right carotid artery: Secondary | ICD-10-CM

## 2022-04-15 DIAGNOSIS — I441 Atrioventricular block, second degree: Secondary | ICD-10-CM

## 2022-04-15 DIAGNOSIS — Z95828 Presence of other vascular implants and grafts: Secondary | ICD-10-CM

## 2022-04-15 DIAGNOSIS — R001 Bradycardia, unspecified: Secondary | ICD-10-CM

## 2022-04-15 LAB — CBC
HCT: 37.5 % — ABNORMAL LOW (ref 39.0–52.0)
Hemoglobin: 12.9 g/dL — ABNORMAL LOW (ref 13.0–17.0)
MCH: 31.4 pg (ref 26.0–34.0)
MCHC: 34.4 g/dL (ref 30.0–36.0)
MCV: 91.2 fL (ref 80.0–100.0)
Platelets: 238 10*3/uL (ref 150–400)
RBC: 4.11 MIL/uL — ABNORMAL LOW (ref 4.22–5.81)
RDW: 12.4 % (ref 11.5–15.5)
WBC: 12.5 10*3/uL — ABNORMAL HIGH (ref 4.0–10.5)
nRBC: 0 % (ref 0.0–0.2)

## 2022-04-15 LAB — BASIC METABOLIC PANEL
Anion gap: 5 (ref 5–15)
BUN: 20 mg/dL (ref 8–23)
CO2: 23 mmol/L (ref 22–32)
Calcium: 8.8 mg/dL — ABNORMAL LOW (ref 8.9–10.3)
Chloride: 110 mmol/L (ref 98–111)
Creatinine, Ser: 1.1 mg/dL (ref 0.61–1.24)
GFR, Estimated: >60 mL/min (ref >60)
Glucose, Bld: 151 mg/dL — ABNORMAL HIGH (ref 70–99)
Potassium: 4.2 mmol/L (ref 3.5–5.1)
Sodium: 138 mmol/L (ref 135–145)

## 2022-04-15 LAB — POCT ACTIVATED CLOTTING TIME: Activated Clotting Time: 281 seconds

## 2022-04-15 LAB — GLUCOSE, CAPILLARY
Glucose-Capillary: 159 mg/dL — ABNORMAL HIGH (ref 70–99)
Glucose-Capillary: 142 mg/dL — ABNORMAL HIGH (ref 70–99)
Glucose-Capillary: 154 mg/dL — ABNORMAL HIGH (ref 70–99)

## 2022-04-15 MED ORDER — CLOPIDOGREL BISULFATE 75 MG PO TABS
150.0000 mg | ORAL_TABLET | ORAL | Status: AC
Start: 2022-04-15 — End: 2022-04-15
  Administered 2022-04-15: 150 mg via ORAL
  Filled 2022-04-15: qty 2

## 2022-04-15 MED ORDER — ASPIRIN 81 MG PO TBEC: 81.0000 mg | DELAYED_RELEASE_TABLET | Freq: Every day | ORAL | Status: DC

## 2022-04-15 MED ORDER — TAMSULOSIN HCL 0.4 MG PO CAPS
0.8000 mg | ORAL_CAPSULE | Freq: Every day | ORAL | Status: DC
  Administered 2022-04-15 – 2022-04-16 (×2): 0.8 mg via ORAL
  Filled 2022-04-15 (×2): qty 2

## 2022-04-15 MED ORDER — ASPIRIN 325 MG PO TABS
325.0000 mg | ORAL_TABLET | ORAL | Status: AC
  Administered 2022-04-15: 325 mg via ORAL
  Filled 2022-04-15: qty 1

## 2022-04-15 MED ORDER — CLOPIDOGREL BISULFATE 75 MG PO TABS: 150.0000 mg | ORAL_TABLET | ORAL | Status: DC

## 2022-04-15 MED ORDER — CLOPIDOGREL BISULFATE 75 MG PO TABS
75.0000 mg | ORAL_TABLET | Freq: Every day | ORAL | Status: DC
  Administered 2022-04-16: 75 mg via ORAL
  Filled 2022-04-15: qty 1

## 2022-04-15 NOTE — Plan of Care (Signed)
PAD removed as ordered this morning site with soft previous hematoma, no changes from previous assessment of which MD was aware. Dopamine trituration on going, pause for now.  Pt OOB bed to [facility] several times and tolerating activities well with minimal standby assist. Pt had two large BM today. Tolerating meals well. Family were updated by attending at bedside. Plan is to wean pt off opamine gtt and watch over night and possible discharge tomorrow and Friday if stable. Care plan and new medication reviewed with pt and family. Pt have denies any major pain all day.

## 2022-04-15 NOTE — Progress Notes (Signed)
An USGPIV (ultrasound guided PIV) has been placed for short-term vasopressor infusion. A correctly placed ivWatch must be used when administering Vasopressors. Should this treatment be needed beyond 72 hours, central line access should be obtained.  It will be the responsibility of the bedside nurse to follow best practice to prevent extravasations.   ?

## 2022-04-15 NOTE — Evaluation (Signed)
Speech Language Pathology Evaluation Patient Details Name: Ernest James. MRN: [id number] DOB: September 06, 1936 Today's Date: 04/15/2022 Time: 0160-1093 SLP Time Calculation (min) (ACUTE ONLY): 10 min  Problem List:  Patient Active Problem List   Diagnosis Date Noted   Carotid stenosis, symptomatic w/o infarct, right 04/14/2022   OSA (obstructive sleep apnea) 01/24/2020   1st degree AV block 05/30/2019   Hyperlipidemia 12/16/2017   Insomnia 03/30/2017   BPH (benign prostatic hyperplasia) 05/27/2016   Carotid stenosis 11/27/2015   TIA (transient ischemic attack) 11/27/2015   Allergic rhinitis, seasonal 11/22/2015   Family history of early CAD 11/22/2015   Phimosis 01/29/2014   Diabetes mellitus without complication ([place]) 23/55/7322   Abnormal liver enzymes 03/24/2009   Pure hyperglyceridemia 03/20/2009   Hypothyroidism 09/18/2008   History of adenomatous polyp of colon 09/07/2004   Primary hypertension 02/16/2002   Esophagitis, reflux 02/16/2002   Past Medical History:  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([place])    Past Surgical History:  Past Surgical History:  Procedure Laterality Date   CAROTID PTA/STENT INTERVENTION Left 04/14/2022   Procedure: CAROTID PTA/STENT INTERVENTION;  Surgeon: Katha Cabal, MD;  Location: [facility];  Service: Cardiovascular;  Laterality: Left;   CIRCUMCISION     COLONOSCOPY WITH PROPOFOL N/A 03/31/2016   Procedure: COLONOSCOPY WITH PROPOFOL;  Surgeon: Lollie Sails, MD;  Location: [facility] ENDOSCOPY;  Service: Endoscopy;  Laterality: N/A;   MRI     SINUS SURGERY  1994   on left side   TONSILLECTOMY     HPI:  Pt underwent planned left ICA stenting on 04/14/2022 without acute complication.  Post procedure notable for bradycardia and word finding difficulty. CT head notable for possible embolic lesion in the M3 branch in the deep insula on the left. Past medical history also includes coronary artery calcification, carotid artery disease, HTN, RAS, celiac artery and SMA stenosis, DM, hypothyroidism.   Assessment / Plan / Recommendation Clinical Impression  Pt presents with mild anomia that is c/b intermittent semantic paraphasias during conversation. when participating in naming tasks, pt had mild difficulty with generative naming task. Pt is aware of errors and is able to self-correct. He mentions that his ability has gotten better but not at baseline. Education was provided on anomia as well as Outpatient ST services should pt continue to experience any difficulties at discharge. Urged pt to continue to reading (book was on his bedside table).    SLP Assessment  SLP Recommendation/Assessment: All further Speech Lanaguage Pathology  needs can be addressed in the next venue of care SLP Visit Diagnosis: Aphasia (R47.01)    Recommendations for follow up therapy are one component of a multi-disciplinary discharge planning process, led by the attending physician.  Recommendations may be updated based on patient status, additional functional criteria and insurance authorization.    Follow Up Recommendations  Outpatient SLP    Assistance Recommended at Discharge  None  Functional Status Assessment Patient has not had a recent decline in their functional status  Frequency and Duration   N/A        SLP Evaluation Cognition  Overall Cognitive Status: Within Functional Limits for tasks assessed       Comprehension  Auditory Comprehension Overall Auditory Comprehension: Appears within functional limits for tasks assessed Visual Recognition/Discrimination Discrimination: Within Function Limits Reading Comprehension Reading Status: Within funtional limits    Expression  Expression Primary Mode of Expression: Verbal Verbal Expression Overall Verbal Expression: Impaired Initiation: No impairment Automatic Speech: Name;Social Response Level of Generative/Spontaneous Verbalization: Conversation Repetition: No impairment Naming: Impairment Responsive: 76-100% accurate Confrontation: Within functional limits Convergent: 75-100% accurate Divergent: 50-74% accurate Verbal Errors: Semantic paraphasias;Aware of errors Pragmatics: No impairment Non-Verbal Means of Communication: Not applicable Written Expression Dominant Hand: Right Written Expression: Not tested   Oral / Motor  Oral Motor/Sensory Function Overall Oral Motor/Sensory Function: Within functional limits Motor Speech Overall Motor Speech: Appears within functional limits for tasks assessed           Amye Grego B. Rutherford Nail, M.S., CCC-SLP, Mining engineer Certified Brain Injury [place]  [place] Office [phone] Ascom [phone] Fax [phone]

## 2022-04-15 NOTE — Progress Notes (Signed)
[facility]  Daily Progress Note   Subjective  - 1 Day Post-Op   Speech much better apparently the Foley has been removed patient did have a small bowel movement  Objective Vitals:   04/15/22 1430 04/15/22 1500 04/15/22 1530 04/15/22 1600  BP:  (!) 113/39 (!) 119/47 (!) 131/53  Pulse: 64 (!) 57 (!) 59 64  Resp: '17 17 18 '$ (!) 22  Temp:    98.5 F (36.9 C)  TempSrc:      SpO2: 96% 97% 96% 96%  Weight:      Height:        Intake/Output Summary (Last 24 hours) at 04/15/2022 1637 Last data filed at 04/15/2022 1614 Gross per 24 hour  Intake 2884.22 ml  Output 1150 ml  Net 1734.22 ml    PULM  Normal effort , no use of accessory muscles CV  No JVD, RRR Abd      No distended, nontender VASC  neurologic patient is essentially intact he is fluent and making very few mistakes.  Arms and legs are moving normally.  Laboratory CBC    Component Value Date/Time   WBC 12.5 (H) 04/15/2022 0436   HGB 12.9 (L) 04/15/2022 0436   HGB 14.1 05/30/2019 1454   HCT 37.5 (L) 04/15/2022 0436   HCT 42.1 05/30/2019 1454   PLT 238 04/15/2022 0436   PLT 302 05/30/2019 1454    BMET    Component Value Date/Time   NA 138 04/15/2022 0436   NA 136 02/06/2022 1617   K 4.2 04/15/2022 0436   CL 110 04/15/2022 0436   CO2 23 04/15/2022 0436   GLUCOSE 151 (H) 04/15/2022 0436   BUN 20 04/15/2022 0436   BUN 15 02/06/2022 1617   CREATININE 1.10 04/15/2022 0436   CALCIUM 8.8 (L) 04/15/2022 0436   GFRNONAA >60 04/15/2022 0436   GFRAA >60 12/23/2019 1352    Assessment/Planning: POD # 1 s/p left carotid stent  Wean dopamine Bladder scan to ensure he is not going back into retention Carb controlled diet as he has been cleared by speech Okay to be up to chair and bedside commode  [employer]  04/15/2022, 4:37 PM

## 2022-04-15 NOTE — Consult Note (Signed)
Cardiology Consultation:   Patient ID: Ernest James.; [id number]; Jul 05, 1936   Admit date: 04/14/2022 Date of Consult: 04/15/2022  Primary Care Provider: Birdie Sons, MD Primary Cardiologist: Ag[place]Etang Primary Electrophysiologist:  None   Patient Profile:   Ernest James. is a 86 y.o. male with a hx of coronary artery calcification, carotid artery disease, HTN, RAS, celiac artery and SMA stenosis, DM, hypothyroidism, and borderline dilatation of the aortic root who is being seen today for the evaluation of bradycardia at the request of Dr. Delana Meyer.  History of Present Illness:   Ernest James was evaluated by Dr. Garen Lah in 01/2022 for preprocedure cardiac risk stratification for upcoming carotid artery intervention. He had recently been seen in the ED with paresthesias and right upper extremity.  Work-up in the ED head CT did not show any acute process follow-up CT imaging revealed degenerative disease.  Troponins peaked at 27. CTA neck showed 67-20% proximal LICA stenosis, as well as bilateral vertebral artery stenosis of 70%. Subsequent Lexiscan MPI was without evidence of ischemia and overall low risk. CT images notable for severe aortic atherosclerosis and coronary artery calcifications, especially in the LAD. Echo showed an EF of 60-65%, no RWMA, mild LVH, Gr1DD, normal RVSF, midl MR, and borderline dilatation of the aortic root. There was > 60% right renal artery stenosis with 1-59% left renal artery stenosis and 70-99% stenosis of the celiac artery and SMA were noted on renal artery ultrasound.   He underwent planned left ICA stenting on 04/14/2022 without acute complication. Post procedure notable for bradycardia with rare episodes of Mobitz 2nd degree AV block type I and blocker PACs, as well as word finding difficulty. He has been maintained on a dopamine gtt and is hemodynamically stable. CT head notable for possible embolic lesion in the M3  branch in the deep insula on the left. Not on any AV nodal blocking medications. TSH mildly elevated last month with a normal free T4. Potassium at goal. Currently, sitting up at beside and is without symptoms of angina or decompensation with heart rates in the 70s bpm and BP in the low 947S mmHg systolic. He did note some positional dizziness this morning when standing up. No presyncope or syncope.   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[facility])     Past Surgical History:  Procedure Laterality Date   CAROTID PTA/STENT INTERVENTION Left 04/14/2022   Procedure: CAROTID PTA/STENT INTERVENTION;  Surgeon: Katha Cabal, MD;  Location: [facility];  Service: Cardiovascular;  Laterality: Left;   CIRCUMCISION     COLONOSCOPY WITH PROPOFOL N/A 03/31/2016   Procedure: COLONOSCOPY WITH PROPOFOL;  Surgeon: Lollie Sails, MD;  Location: [facility] ENDOSCOPY;  Service: Endoscopy;  Laterality: N/A;   MRI     SINUS SURGERY  1994   on left side   TONSILLECTOMY       Home Meds: Prior to Admission medications   Medication Sig Start Date End Date Taking? Authorizing Provider  aspirin EC 81 MG tablet Take 81 mg by mouth daily.   Yes [provider]  atorvastatin (LIPITOR) 40 MG tablet Take 1 tablet by mouth once daily 07/16/21  Yes Fisher, Kirstie Peri, MD  Cholecalciferol (VITAMIN D3) 20 MCG TABS Take 1 tablet by mouth daily.    Yes [provider]  Cyanocobalamin (VITAMIN B12) 1000 MCG TBCR Take 1 tablet by mouth daily.   Yes [provider]  glucose blood (ONETOUCH VERIO) test strip Test TID. Use as instructed 08/17/19  Yes Flinchum, Kelby Aline, FNP  hydrocortisone 2.5 % cream Apply topically. 04/23/20  Yes [provider]   ketoconazole (NIZORAL) 2 % shampoo Apply 1 application topically 2 (two) times a week. 04/23/20  Yes [provider]  Lancets Glory Rosebush ULTRASOFT) lancets Test TID as instructed 08/17/19  Yes Flinchum, Kelby Aline, FNP  levothyroxine (SYNTHROID) 75 MCG tablet TAKE 1 TABLET BY MOUTH ONCE DAILY BEFORE BREAKFAST 04/08/22  Yes Birdie Sons, MD  losartan (COZAAR) 50 MG tablet Take 1 tablet by mouth once daily 08/17/21  Yes Fisher, Kirstie Peri, MD  meloxicam (MOBIC) 7.5 MG tablet Take 1 tablet (7.5 mg total) by mouth daily as needed for pain. 02/06/22  Yes Birdie Sons, MD  Multiple Vitamins-Minerals (MULTIVITAMIN ADULT PO) Take 1 tablet by mouth daily.  03/20/09  Yes [provider]  nystatin cream (MYCOSTATIN) Apply topically 2 (two) times daily. 09/02/21  Yes [provider]  tamsulosin (FLOMAX) 0.4 MG CAPS capsule Take 1 capsule (0.4 mg total) by mouth 2 (two) times daily. 08/25/19  Yes Birdie Sons, MD  triamcinolone cream (KENALOG) 0.1 % APPLY TO AFFECTED AREAS TWICE DAILY UNTIL IMPROVED THEN DISCONTINUE 11/01/20  Yes [provider]  triamterene-hydrochlorothiazide (MAXZIDE) 75-50 MG tablet Take 1 tablet by mouth once daily 10/01/21  Yes Birdie Sons, MD  metFORMIN (GLUCOPHAGE-XR) 500 MG 24 hr tablet Take 1 tablet by mouth once daily with breakfast 10/01/21   Birdie Sons, MD    Inpatient Medications: Scheduled Meds:  aspirin EC  81 mg Oral Q0600   atorvastatin  40 mg Oral Daily   Chlorhexidine Gluconate Cloth  6 each Topical Daily   clopidogrel  75 mg Oral Q0600   docusate sodium  100 mg Oral Daily   insulin aspart  0-15 Units Subcutaneous TID WC   levothyroxine  75 mcg Oral QAC breakfast   pantoprazole (PROTONIX) IV  40 mg Intravenous QHS   tamsulosin  0.4 mg Oral BID   Continuous Infusions:  sodium chloride     dextrose 5 % and 0.9 % NaCl with KCl 20 mEq/L 100 mL/hr at 04/15/22 1000   DOPamine 8 mcg/kg/min (04/15/22 0533)   PRN  Meds: sodium chloride, acetaminophen **OR** acetaminophen, alum & mag hydroxide-simeth, bisacodyl, morphine injection, ondansetron, mouth rinse, oxyCODONE, phenol  Allergies:  No Known Allergies  Social History:   Social History   Socioeconomic History   Marital status: Married    Spouse name: Not on file   Number of children: 3   Years of education: Not on file   Highest education level: Bachelor's degree (e.g., BA, AB, BS)  Occupational History   Occupation: Architectural technologist    Comment: retired  Tobacco Use   Smoking status: Never   Smokeless tobacco: Never  Vaping Use   Vaping Use: Never used  Substance and Sexual Activity   Alcohol use: No    Alcohol/week: 0.0 standard drinks of alcohol   Drug use: No   Sexual activity: Yes    Birth control/protection: None  Other Topics Concern   Not on file  Social History Narrative   Not on file   Social Determinants of Health   Financial Resource Strain: Low  Risk  (11/05/2021)   Overall Financial Resource Strain (CARDIA)    Difficulty of Paying Living Expenses: Not hard at all  Food Insecurity: No Food Insecurity (11/05/2021)   Hunger Vital Sign    Worried About Running Out of Food in the Last Year: Never true    Ran Out of Food in the Last Year: Never true  Transportation Needs: No Transportation Needs (11/05/2021)   PRAPARE - Hydrologist (Medical): No    Lack of Transportation (Non-Medical): No  Physical Activity: Insufficiently Active (11/05/2021)   Exercise Vital Sign    Days of Exercise per Week: 3 days    Minutes of Exercise per Session: 30 min  Stress: No Stress Concern Present (11/05/2021)   [place]    Feeling of Stress : Only a little  Social Connections: Moderately Integrated (11/05/2021)   Social Connection and Isolation Panel [NHANES]    Frequency of Communication with Friends and Family: More than three times a week    Frequency of  Social Gatherings with Friends and Family: More than three times a week    Attends Religious Services: More than 4 times per year    Active Member of [employer] or Organizations: No    Attends Archivist Meetings: Never    Marital Status: Married  Human resources officer Violence: Not At Risk (11/05/2021)   Humiliation, Afraid, Rape, and Kick questionnaire    Fear of Current or Ex-Partner: No    Emotionally Abused: No    Physically Abused: No    Sexually Abused: No     Family History:   Family History  Problem Relation Age of Onset   Alzheimer's disease Mother    CAD Father    Diabetes Sister        type 2    ROS:  Review of Systems  Constitutional:  Positive for malaise/fatigue. Negative for chills, diaphoresis, fever and weight loss.  HENT:  Negative for congestion.   Eyes:  Negative for discharge and redness.  Respiratory:  Negative for cough, sputum production, shortness of breath and wheezing.   Cardiovascular:  Negative for chest pain, palpitations, orthopnea, claudication, leg swelling and PND.  Gastrointestinal:  Negative for abdominal pain, heartburn, nausea and vomiting.  Musculoskeletal:  Negative for falls and myalgias.  Skin:  Negative for rash.  Neurological:  Positive for dizziness. Negative for tingling, tremors, sensory change, speech change, focal weakness, loss of consciousness and weakness.       Difficulty with word finding  Endo/Heme/Allergies:  Does not bruise/bleed easily.  Psychiatric/Behavioral:  Negative for substance abuse. The patient is not nervous/anxious.       Physical Exam/Data:   Vitals:   04/15/22 0830 04/15/22 0900 04/15/22 0925 04/15/22 0930  BP: (!) 112/35 (!) 105/34 106/62 106/62  Pulse: 62 (!) 45 73 73  Resp: '13 12 18 13  '$ Temp: 98.8 F (37.1 C)  98.8 F (37.1 C)   TempSrc: Oral     SpO2: 91% 96% 92% (!) 86%  Weight:      Height:        Intake/Output Summary (Last 24 hours) at 04/15/2022 1011 Last data filed at 04/15/2022  1000 Gross per 24 hour  Intake 2482.02 ml  Output 1400 ml  Net 1082.02 ml   Filed Weights   04/14/22 0724 04/14/22 1121  Weight: 90.3 kg 89.4 kg   Body mass index is 28.28 kg/m.   Physical Exam: General: Well  developed, well nourished, in no acute distress. Head: Normocephalic, atraumatic, sclera non-icteric, no xanthomas, nares without discharge.  Neck: Negative for carotid bruits. JVD not elevated. Lungs: Clear bilaterally to auscultation without wheezes, rales, or rhonchi. Breathing is unlabored. Heart: RRR with S1 S2. No murmurs, rubs, or gallops appreciated. Abdomen: Soft, non-tender, non-distended with normoactive bowel sounds. No hepatomegaly. No rebound/guarding. No obvious abdominal masses. Msk:  Strength and tone appear normal for age. Extremities: No clubbing or cyanosis. No edema. Distal pedal pulses are 2+ and equal bilaterally. Neuro: Alert and oriented X 3. No facial asymmetry. No focal deficit. Moves all extremities spontaneously. Psych:  Responds to questions appropriately with a normal affect.   EKG:  The EKG was personally reviewed and demonstrates: Mobitz type I, 40 bpm, no acute st/t changes Telemetry:  Telemetry was personally reviewed and demonstrates: SR with sinus bradycardia, blocked PACs, and brief episode of Mobitz 2nd AV block type I  Weights: Filed Weights   04/14/22 0724 04/14/22 1121  Weight: 90.3 kg 89.4 kg    Relevant CV Studies: As above  Laboratory Data:  Chemistry Recent Labs  Lab 04/14/22 0742 04/15/22 0436  NA  --  138  K  --  4.2  CL  --  110  CO2  --  23  GLUCOSE  --  151*  BUN 24* 20  CREATININE 1.31* 1.10  CALCIUM  --  8.8*  GFRNONAA 54* >60  ANIONGAP  --  5    No results for input(s): "PROT", "ALBUMIN", "AST", "ALT", "ALKPHOS", "BILITOT" in the last 168 hours. Hematology Recent Labs  Lab 04/15/22 0436  WBC 12.5*  RBC 4.11*  HGB 12.9*  HCT 37.5*  MCV 91.2  MCH 31.4  MCHC 34.4  RDW 12.4  PLT 238   Cardiac  EnzymesNo results for input(s): "TROPONINI" in the last 168 hours. No results for input(s): "TROPIPOC" in the last 168 hours.  BNPNo results for input(s): "BNP", "PROBNP" in the last 168 hours.  DDimer No results for input(s): "DDIMER" in the last 168 hours.  Radiology/Studies:  CT HEAD WO CONTRAST (5MM)  Result Date: 04/14/2022 IMPRESSION: No acute CT parenchymal finding. Mild chronic small-vessel ischemic change of the white matter. One could question a hyperdense M3 branch in the deep insula on the left which could indicate embolic disease, but this is not a certain finding. Electronically Signed   By: Nelson Chimes M.D.   On: 04/14/2022 12:22     Assessment and Plan:   1. Bradycardia/Mobitz 2nd degree AV block type I/blocked PACs: -Hemodynamically stable -Likely in the setting of left ICA stenting with concern for possible CVA as well -Remains on dopamine gtt, wean as tolerated, likely over the next 24 hours -No indication for transcutaneous pacing, temp wire or PPM -Recommend pacer pads at bedside  -Check TSH and magnesium -Not on any AV nodal blocking medications, continue to avoid  2. Carotid artery disease with word finding difficulty and dysphagia: -Status post left ICA stenting -CT head notable for possible embolic lesion in the M3 branch in the deep insula on the left -Swallow study pending -Recommend MRI brain, will defer to vascular surgery -Antiplatelet therapy per vascular surgery   3. CAD: -No symptoms of angina or decompensation -Risk factor modification -ASA/Plavix per vascular surgery as above   -LDL 37 -Lipitor   For questions or updates, please contact [facility] Please consult [email or web address] for contact info under Cardiology/STEMI.   Signed, Christell Faith, PA-C [place] Pager: [phone] 04/15/2022, 10:11  AM    

## 2022-04-15 NOTE — Evaluation (Signed)
Clinical/Bedside Swallow Evaluation Patient Details  Name: [facility] Ernest James. MRN: [id number] Date of Birth: 11-29-1935  Today's Date: 04/15/2022 Time: SLP Start Time (ACUTE ONLY): 1135 SLP Stop Time (ACUTE ONLY): 1155 SLP Time Calculation (min) (ACUTE ONLY): 20 min  Past Medical History:  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(HCC)    Past Surgical History:  Past Surgical History:  Procedure Laterality Date   CAROTID PTA/STENT INTERVENTION Left 04/14/2022   Procedure: CAROTID PTA/STENT INTERVENTION;  Surgeon: Katha Cabal, MD;  Location: [facility];  Service: Cardiovascular;  Laterality: Left;   CIRCUMCISION     COLONOSCOPY WITH PROPOFOL N/A 03/31/2016   Procedure: COLONOSCOPY WITH PROPOFOL;  Surgeon: Lollie Sails, MD;  Location: [facility] ENDOSCOPY;  Service: Endoscopy;  Laterality: N/A;   MRI     SINUS SURGERY  1994   on left side   TONSILLECTOMY     HPI:  Pt underwent planned left ICA stenting on 04/14/2022 without acute complication. Post procedure notable for bradycardia and word finding difficulty. CT head notable for possible embolic lesion in the M3 branch in the deep insula on the left. Past medical history also includes coronary artery calcification, carotid artery disease, HTN, RAS, celiac artery and SMA stenosis, DM, hypothyroidism.    Assessment / Plan / Recommendation  Clinical Impression  Pt prsents with adequate oropharyngeal abilities when consuming ice chips, thin liquids liquids via spoon, cup, puree and graham crackers. Pt occasionally burped and stated that he "use to take reflux medicine but stoped." He began taking in response to heartburn. Recommend pt alternate bits of food with liquids to help with any  potential esophageal issues. ST intervent is not indicated at this time. SLP Visit Diagnosis: Dysphagia, unspecified (R13.10)    Aspiration Risk  No limitations    Diet Recommendation Regular;Thin liquid   Liquid Administration via: Cup;Straw Medication Administration: Whole meds with liquid Supervision: Patient able to self feed Compensations: Minimize environmental distractions;Slow rate;Small sips/bites Postural Changes: Seated upright at 90 degrees;Remain upright for at least 30 minutes after po intake    Other  Recommendations Oral Care Recommendations: Oral care BID    Recommendations for follow up therapy are one component of a multi-disciplinary discharge planning process, led by the attending physician.  Recommendations may be updated based on patient status, additional functional criteria and insurance authorization.  Follow up Recommendations Outpatient SLP      Assistance Recommended at Discharge None  Functional Status Assessment Patient has not had a recent decline in their functional status  Frequency and Duration   N/A         Prognosis   N/A     Swallow Study   General Date of Onset: 04/14/22 HPI: Pt underwent planned left ICA stenting on 04/14/2022 without acute complication. Post procedure notable for bradycardia and word finding difficulty. CT head notable for possible embolic lesion in the M3 branch in the deep insula on the left. Past medical history also includes coronary artery calcification, carotid artery disease, HTN, RAS, celiac artery and SMA stenosis, DM, hypothyroidism. Type of Study: Bedside Swallow Evaluation Previous Swallow Assessment: none in chart Diet Prior to this Study: NPO Temperature Spikes Noted: No Respiratory Status: Nasal cannula (  1 liter) History of Recent Intubation:  (procedure only) Behavior/Cognition: Alert;Cooperative;Pleasant mood Oral Cavity Assessment: Within Functional Limits Oral Care Completed by SLP: No Oral Cavity  - Dentition: Adequate natural dentition Vision: Functional for self-feeding Self-Feeding Abilities: Able to feed self Patient Positioning: Upright in bed Baseline Vocal Quality: Normal Volitional Cough: Strong Volitional Swallow: Able to elicit    Oral/Motor/Sensory Function Overall Oral Motor/Sensory Function: Within functional limits   Ice Chips Ice chips: Within functional limits Presentation: Self Fed;Spoon   Thin Liquid Thin Liquid: Within functional limits Presentation: Cup;Self Fed;Spoon    Nectar Thick Nectar Thick Liquid: Not tested   Honey Thick Honey Thick Liquid: Not tested   Puree Puree: Within functional limits Presentation: Self Fed;Spoon   Solid     Solid: Within functional limits Presentation: Self Fed     Ernest James B. Rutherford Nail, M.S., CCC-SLP, Mining engineer Certified Brain Injury [place]  [place] Office [phone] Ascom [phone] Fax [phone]

## 2022-04-15 NOTE — Progress Notes (Signed)
Pt remains bradycardic 38-50s. BP better, but diastolic is still soft. EKG shows a new possible heart block with p waves irregular or dropped. 12 lead done and Dr. Lucky Cowboy informed. No new orders.

## 2022-04-16 ENCOUNTER — Telehealth: Payer: Self-pay | Admitting: Emergency Medicine

## 2022-04-16 LAB — GLUCOSE, CAPILLARY
Glucose-Capillary: 130 mg/dL — ABNORMAL HIGH (ref 70–99)
Glucose-Capillary: 154 mg/dL — ABNORMAL HIGH (ref 70–99)

## 2022-04-16 MED ORDER — CLOPIDOGREL BISULFATE 75 MG PO TABS: 75.0000 mg | ORAL_TABLET | Freq: Every day | ORAL | 5 refills | Status: DC

## 2022-04-16 NOTE — Telephone Encounter (Signed)
Called patient to go over results. No answer. Left detailed message.

## 2022-04-16 NOTE — Telephone Encounter (Signed)
-----   Message from Kate Sable, MD sent at 04/10/2022  3:23 PM EDT ----- Right renal artery stenosis noted, celiac artery stenosis noted.  Continue medications as prescribed.  Keep follow-up appointment with vascular surgery (Dr. Delana Meyer) as he is already an established patient.

## 2022-04-16 NOTE — Progress Notes (Signed)
Pt discharged home with daughter Ernest James, discharged instructions given and explained to patient. Ernest James her daughter wanted to talk to Dr, Dr called Pt daughter on her personal cell phone to answer her questions. Pt daughter left the unit while talking to the attending on the phone. Pt himself was wheeled out the unit. Pt denies having any questions. Prn tylenol given to pt per her daughter question. VSS.

## 2022-04-16 NOTE — Discharge Summary (Signed)
[facility]    Discharge Summary    Patient ID:  [facility] Ernest James. MRN: [id number] DOB/AGE: [birth year] 84 y.o.  Admit date: 04/14/2022 Discharge date: 04/16/2022 Date of Surgery: 04/14/2022 Surgeon: Surgeon(s): Phylliss Strege, Dolores Lory, MD  Admission Diagnosis: Carotid stenosis, symptomatic w/o infarct, right [I65.21]  Discharge Diagnoses:  Carotid stenosis, symptomatic w/o infarct, right [I65.21]  Secondary Diagnoses: 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(HCC)     Procedure(s): CAROTID PTA/STENT INTERVENTION  Discharged Condition: good  HPI:  Patient presented for stenting of his left symptomatic internal carotid artery.  Procedure was successful.  Post procedure the patient did have significant hypotension which required dopamine infusion.  I believe this also caused his difficulty with speech and cognitive dysfunction.  A noncontrasted CT was performed to ensure that there was no hemorrhage.  The radiologic interpretation was erroneous since a noncontrasted head CT does not provide adequate information to evaluate occlusion of the cerebral vasculature.  With improvement of the patient's hemodynamics his speech has completely normalized.  Postoperative day #1 we did have some persistent bradycardia likely secondary to the stent this appears to have resolved he is off his dopamine sitting up and tolerating a regular diet his speech is normal.  He is fit for discharge postoperative day #2.  Also as an added issue the patient was noted to be in severe urinary retention the afternoon of the stenting.  I believe this was also adding to his bradycardia.  His hemodynamics improved significantly after Foley was placed consistent  with a vasovagal response.  Foley was removed the next day and the patient has been initiated on Flomax which he had not been taking at home.  Hospital Course:  Ernest Herschell Jerico Grisso. is a 86 y.o. male is S/P Left Procedure(s): CAROTID PTA/STENT INTERVENTION Extubated: POD # 0 and patient was done under conscious sedation and not intubated Physical exam: Neurologically the patient is intact Post-op wounds clean, dry, intact or healing well Pt. Ambulating, voiding and taking PO diet without difficulty. Pt pain controlled with PO pain meds. Labs as below Complications: Persistent hypotension requiring dopamine infusion for approximately 36 hours in association with some bradycardia.  Patient was seen by cardiology and cleared.  Consults:  Treatment Team:  Wellington Hampshire, MD  Significant Diagnostic Studies: CBC Lab Results  Component Value Date   WBC 12.5 (H) 04/15/2022   HGB 12.9 (L) 04/15/2022   HCT 37.5 (L) 04/15/2022   MCV 91.2 04/15/2022   PLT 238 04/15/2022    BMET    Component Value Date/Time   NA 138 04/15/2022 0436   NA 136 02/06/2022 1617   K 4.2 04/15/2022 0436   CL 110 04/15/2022 0436   CO2 23 04/15/2022 0436   GLUCOSE 151 (H) 04/15/2022 0436   BUN 20 04/15/2022 0436   BUN 15 02/06/2022 1617   CREATININE 1.10 04/15/2022 0436   CALCIUM 8.8 (L) 04/15/2022 0436   GFRNONAA >60 04/15/2022 0436   GFRAA >60 12/23/2019 1352   COAG No results found for: "INR", "PROTIME"   Disposition:  Discharge to :Home Discharge Instructions     Call MD for:  redness, tenderness, or signs of infection (pain, swelling, bleeding,  redness, odor or green/yellow discharge around incision site)   Complete by: As directed    Call MD for:  severe or increased pain, loss or decreased feeling  in affected limb(s)   Complete by: As directed    Call MD for:  temperature >100.5   Complete by: As directed    Discharge instructions   Complete by: As directed    Okay to  shower.  Please remove the right groin bandage before showering and replace with a Band-Aid daily as needed   Lifting restrictions   Complete by: As directed    No lifting for  1 week   No dressing needed   Complete by: As directed    Replace only if drainage present   Resume previous diet   Complete by: As directed       Allergies as of 04/16/2022   No Known Allergies      Medication List     STOP taking these medications    triamterene-hydrochlorothiazide 75-50 MG tablet Commonly known as: MAXZIDE       TAKE these medications    aspirin EC 81 MG tablet Take 81 mg by mouth daily.   atorvastatin 40 MG tablet Commonly known as: LIPITOR Take 1 tablet by mouth once daily   clopidogrel 75 MG tablet Commonly known as: PLAVIX Take 1 tablet (75 mg total) by mouth daily.   hydrocortisone 2.5 % cream Apply topically.   ketoconazole 2 % shampoo Commonly known as: NIZORAL Apply 1 application topically 2 (two) times a week.   levothyroxine 75 MCG tablet Commonly known as: SYNTHROID TAKE 1 TABLET BY MOUTH ONCE DAILY BEFORE BREAKFAST   losartan 50 MG tablet Commonly known as: COZAAR Take 1 tablet by mouth once daily   meloxicam 7.5 MG tablet Commonly known as: MOBIC Take 1 tablet (7.5 mg total) by mouth daily as needed for pain.   metFORMIN 500 MG 24 hr tablet Commonly known as: GLUCOPHAGE-XR Take 1 tablet by mouth once daily with breakfast   MULTIVITAMIN ADULT PO Take 1 tablet by mouth daily.   nystatin cream Commonly known as: MYCOSTATIN Apply topically 2 (two) times daily.   onetouch ultrasoft lancets Test TID as instructed   OneTouch Verio test strip Generic drug: glucose blood Test TID. Use as instructed   tamsulosin 0.4 MG Caps capsule Commonly known as: FLOMAX Take 1 capsule (0.4 mg total) by mouth 2 (two) times daily.   triamcinolone cream 0.1 % Commonly known as: KENALOG APPLY TO AFFECTED AREAS TWICE DAILY UNTIL IMPROVED THEN  DISCONTINUE   Vitamin B12 1000 MCG Tbcr Take 1 tablet by mouth daily.   Vitamin D3 20 MCG (800 UNIT) Tabs Take 1 tablet by mouth daily.       Verbal and written Discharge instructions given to the patient. Wound care per Discharge AVS  Follow-up Information     Birdie Sons, MD Follow up in 1 week(s).   Specialty: Family Medicine Why: BP check, follow up after procedure Contact information: [place] [address] [place], Dolores Lory, MD Follow up in 2 day(s).   Specialties: Vascular Surgery, Cardiology, Radiology, Vascular Surgery Why: follow up after procedure with a carotid duplex s/p stent Contact information: [place] [address] [phone]         Abbie Sons, MD Follow up in 1 week(s).   Specialty: Urology Why: urinary retention, follow up after procedure Contact  information: [address] [place] [address] [phone]                 Signed: [name], MD  04/16/2022, 9:00 AM

## 2022-04-17 ENCOUNTER — Other Ambulatory Visit: Payer: Self-pay | Admitting: *Deleted

## 2022-04-17 DIAGNOSIS — R0602 Shortness of breath: Secondary | ICD-10-CM | POA: Diagnosis not present

## 2022-04-17 DIAGNOSIS — G4733 Obstructive sleep apnea (adult) (pediatric): Secondary | ICD-10-CM | POA: Diagnosis not present

## 2022-04-17 NOTE — Patient Outreach (Signed)
  Care Coordination [facility] Note Transition Care Management Follow-up Telephone Call Date of discharge and from where: [phone]  How have you been since you were released from the hospital? Feel good Any questions or concerns? Yes Some difficulty understanding appts. RN went over appt.  Items Reviewed: Did the pt receive and understand the discharge instructions provided? Yes  Medications obtained and verified? Yes  Other? No  Any new allergies since your discharge? No  Dietary orders reviewed? No Do you have support at home? Yes   Home Care and Equipment/Supplies: Were home health services ordered? no If so, what is the name of the agency? N   Has the agency set up a time to come to the patient's home? not applicable Were any new equipment or medical supplies ordered?  No What is the name of the medical supply agency? n Were you able to get the supplies/equipment? not applicable Do you have any questions related to the use of the equipment or supplies? No  Functional Questionnaire: (I = Independent and D = Dependent) ADLs: I  Bathing/Dressing- I  Meal Prep- I  Eating- I  Maintaining continence- I  Transferring/Ambulation- I  Managing Meds- I  Follow up appointments reviewed:  PCP Hospital f/u appt confirmed? Yes  Scheduled to see Earney [place] [phone]  1:40 Specialist Hospital f/u appt confirmed? y Scheduled to see Carotid duplex 04/27/2022 1 pm Dr Delana Meyer [phone] 1:45 Dr Zannie Cove august 24, 3 pm Are transportation arrangements needed? No  If their condition worsens, is the pt aware to call PCP or go to the Emergency Dept.? Yes Was the patient provided with contact information for the PCP's office or ED? Yes Was to pt encouraged to call back with questions or concerns? Yes  SDOH assessments and interventions completed:   Yes  Care Coordination Interventions Activated:  No   Care Coordination Interventions:   N     Encounter Outcome:  Pt. Visit Completed     [place] Management [phone]

## 2022-04-17 NOTE — Patient Outreach (Signed)
  Care Coordination [facility] Note Transition Care Management Unsuccessful Follow-up Telephone Call  Date of discharge and from where:  [facility] [phone]  Attempts:  1st Attempt  Reason for unsuccessful TCM follow-up call:  Left voice message  Home Management [phone]

## 2022-04-18 DIAGNOSIS — R0602 Shortness of breath: Secondary | ICD-10-CM | POA: Diagnosis not present

## 2022-04-18 DIAGNOSIS — G4733 Obstructive sleep apnea (adult) (pediatric): Secondary | ICD-10-CM | POA: Diagnosis not present

## 2022-04-21 ENCOUNTER — Other Ambulatory Visit: Payer: Self-pay

## 2022-04-21 ENCOUNTER — Emergency Department
Admission: EM | Admit: 2022-04-21 | Discharge: 2022-04-21 | Disposition: A | Discharge status: Home / Self Care | Payer: Medicare Other | Attending: Emergency Medicine | Admitting: Emergency Medicine

## 2022-04-21 DIAGNOSIS — I10 Essential (primary) hypertension: Secondary | ICD-10-CM | POA: Diagnosis not present

## 2022-04-21 DIAGNOSIS — I2510 Atherosclerotic heart disease of native coronary artery without angina pectoris: Secondary | ICD-10-CM | POA: Insufficient documentation

## 2022-04-21 DIAGNOSIS — R001 Bradycardia, unspecified: Secondary | ICD-10-CM | POA: Diagnosis not present

## 2022-04-21 DIAGNOSIS — N401 Enlarged prostate with lower urinary tract symptoms: Secondary | ICD-10-CM

## 2022-04-21 MED ORDER — AMLODIPINE BESYLATE 5 MG PO TABS
5.0000 mg | ORAL_TABLET | Freq: Once | ORAL | Status: AC
  Administered 2022-04-21: 5 mg via ORAL
  Filled 2022-04-21: qty 1

## 2022-04-21 MED ORDER — TAMSULOSIN HCL 0.4 MG PO CAPS: 0.4000 mg | ORAL_CAPSULE | Freq: Every day | ORAL | 1 refills | Status: DC

## 2022-04-21 MED ORDER — AMLODIPINE BESYLATE 5 MG PO TABS: 5.0000 mg | ORAL_TABLET | Freq: Every day | ORAL | 1 refills | Status: DC

## 2022-04-21 NOTE — ED Provider Notes (Signed)
[facility] Provider Note    Event Date/Time   First MD Initiated Contact with Patient 04/21/22 0700     (approximate)   History   Chief Complaint: Hypertension   HPI  Ernest James. is a 86 y.o. male with a history of hypertension CAD GERD and carotid artery stenosis status post endovascular repair 1 week ago who comes the ED complaining of high blood pressure.  He noted systolic pressures as high as 190 at home.  He denies any headache or vision change, no dizziness or syncope.  No neck pain.  He has some bruising at his right femoral access site, but denies swelling or pressure or pain.  No chest pain or shortness of breath or any other acute symptoms.  He otherwise feels fine but was told to be evaluated if his blood pressure was elevated.     Physical Exam   Triage Vital Signs: ED Triage Vitals  Enc Vitals Group     BP 04/21/22 0704 (!) 178/71     Pulse Rate 04/21/22 0704 (!) 46     Resp 04/21/22 0704 18     Temp 04/21/22 0704 98.2 F (36.8 C)     Temp Source 04/21/22 0704 Oral     SpO2 04/21/22 0700 100 %     Weight 04/21/22 0705 198 lb (89.8 kg)     Height 04/21/22 0705 '5\' 8"'$  (1.727 m)     Head Circumference --      Peak Flow --      Pain Score 04/21/22 0705 0     Pain Loc --      Pain Edu? --      Excl. in [place]? --     Most recent vital signs: Vitals:   04/21/22 0703 04/21/22 0704  BP:  (!) 178/71  Pulse:  (!) 46  Resp:  18  Temp:  98.2 F (36.8 C)  SpO2: 100% 98%    General: Awake, no distress.  CV:  Good peripheral perfusion.  Bradycardia, heart rate 50.  Normal peripheral pulses.  Right femoral pulses normal without swelling or bruit. Resp:  Normal effort.  Clear to auscultation bilaterally Abd:  No distention.  Soft and nontender Other:  There is ecchymosis around the right femoral access site, tissues are soft and nontender.  No inflammatory changes.  No drainage.  No bleeding.   ED Results / Procedures /  Treatments   Labs (all labs ordered are listed, but only abnormal results are displayed) Labs Reviewed - No data to display   EKG Interpreted by me Sinus bradycardia rate of 45.  Normal axis.  First-degree AV block.  Poor R wave progression.  Normal ST segments and T waves.  No ischemic changes.   RADIOLOGY    PROCEDURES:  Procedures   MEDICATIONS ORDERED IN ED: Medications  amLODipine (NORVASC) tablet 5 mg (5 mg Oral Given 04/21/22 0740)     IMPRESSION / MDM / ASSESSMENT AND PLAN / ED COURSE  I reviewed the triage vital signs and the nursing notes.                              Differential diagnosis includes, but is not limited to, essential hypertension, urinary retention.  Doubt carotid dissection or occlusion, doubt aneurysm or pseudoaneurysm, doubt infection.  Patient's presentation is most consistent with severe exacerbation of chronic illness.  Patient presents with markedly elevated blood pressure.  He had a history of hypertension but was discontinued from his antihypertensive regimen prior to vascular surgery.  Surgery was complicated by bradycardia and hypotension requiring dopamine infusion, so antihypertensives were not reinitiated.  Patient has not been taking Flomax as recommended.  In the ED, he has no neurologic or vascular symptoms and exam is normal.  No other acute symptoms.  EKG was repeated since he was noted to have some transient AV block while in the hospital, and today's EKG is reassuring.  Emphasized utility of Flomax for the patient's frequent nighttime awakenings.  Bladder scan showed a PVR of 230.  Foley catheter not needed at this time.  We will start the patient on Norvasc 5 mg daily, recommend close follow-up with PCP to continue monitoring blood pressure and symptoms.       FINAL CLINICAL IMPRESSION(S) / ED DIAGNOSES   Final diagnoses:  Hypertension, unspecified type     Rx / DC Orders   ED Discharge Orders          Ordered     tamsulosin (FLOMAX) 0.4 MG CAPS capsule  Daily        04/21/22 0806    amLODipine (NORVASC) 5 MG tablet  Daily        04/21/22 0806             Note:  This document was prepared using Dragon voice recognition software and may include unintentional dictation errors.   Carrie Mew, MD 04/21/22 [phone]

## 2022-04-21 NOTE — ED Notes (Signed)
Pt ambulatory to waiting room. Pt verbalized understanding of discharge instructions.   

## 2022-04-21 NOTE — ED Triage Notes (Signed)
Pt had a carotid endarectomy, on Tuesday. Was told if his bp got over 160 to come to the er

## 2022-04-22 ENCOUNTER — Ambulatory Visit: Payer: Self-pay

## 2022-04-22 ENCOUNTER — Ambulatory Visit (INDEPENDENT_AMBULATORY_CARE_PROVIDER_SITE_OTHER): Payer: Medicare Other | Admitting: Family Medicine

## 2022-04-22 ENCOUNTER — Encounter: Payer: Self-pay | Admitting: Family Medicine

## 2022-04-22 VITALS — BP 138/62 | HR 52 | Temp 98.6°F | Resp 16 | Wt 200.8 lb

## 2022-04-22 DIAGNOSIS — N401 Enlarged prostate with lower urinary tract symptoms: Secondary | ICD-10-CM

## 2022-04-22 DIAGNOSIS — I6521 Occlusion and stenosis of right carotid artery: Secondary | ICD-10-CM

## 2022-04-22 DIAGNOSIS — R3911 Hesitancy of micturition: Secondary | ICD-10-CM | POA: Diagnosis not present

## 2022-04-22 DIAGNOSIS — I10 Essential (primary) hypertension: Secondary | ICD-10-CM

## 2022-04-22 DIAGNOSIS — G4700 Insomnia, unspecified: Secondary | ICD-10-CM

## 2022-04-22 MED ORDER — LOSARTAN POTASSIUM 50 MG PO TABS: 50.0000 mg | ORAL_TABLET | Freq: Every day | ORAL | 3 refills | Status: DC

## 2022-04-22 NOTE — Assessment & Plan Note (Signed)
Doing well s/p stenting. Continue to follow with Vasc Surg, has upcoming appt.

## 2022-04-22 NOTE — Assessment & Plan Note (Signed)
With urinary retention in hospital requiring foley. Doing well on flomax, continue.

## 2022-04-22 NOTE — Assessment & Plan Note (Signed)
Slightly elevated today, likely amlodipine not reached full effect. Hasn't been taking home losartan, will switch back to losartan given added renal benefits and h/o diabetes. Stop amlodipine. Continue to hold maxzide given frequent urination. F/u in 1 month for recheck. Will check labs at that time.

## 2022-04-22 NOTE — Telephone Encounter (Signed)
  Chief Complaint: trouble sleeping Symptoms: trouble falling asleep and staying asleep Frequency: Since 04/16/2022 Pertinent Negatives: Patient denies  Disposition: '[]'$ ED /'[]'$ Urgent Care (no appt availability in office) / '[x]'$ Appointment(In office/virtual)/ '[]'$  [place] [facility]/ '[]'$ Home Care/ '[]'$ Refused Recommended Disposition /'[]'$ [place] [facility]/ '[]'$  Follow-up with PCP Additional Notes: PT has had trouble falling asleep and staying asleep since coming home from stent placement. Pt was also seen in ED for HTN. Pt reports some stress/anxiety from these events. Pt called regarding same complaint and was told to try Benadryl. Pt states that medication made him even more awake, but may have reduced his anxiety - pt is unsure.  Pt also reports a very dry mouth.   Summary: Trouble sleeping   Patient states he has been having trouble sleeping since coming home from the hospital on 8/10. Patient has hospital follow up on 8/17 but would like to speak to someone before the appointment. Please advise patient.      Reason for Disposition  [1] Insomnia persists > 1 week AND [2] no improvement after using Care Advice  Answer Assessment - Initial Assessment Questions 1. DESCRIPTION: "Tell me about your sleeping problem."      Getting to sleep and staying asleep. 2. ONSET: "How long have you been having trouble sleeping?" (e.g., days, weeks, months)     Since 04/16/2022 3. RECURRENT: "Have you had sleeping problems before?"  If Yes, ask: "What happened that time?" "What helped your sleeping problem go away in the past?"      Frequent bathroom trips in the night. 4. STRESS: "Is there anything in your life that is making you feel stressed or tense?"     Just got home from hospital - then HTN  5. PAIN: "Do you have any pain that is keeping you awake?" (e.g., back pain, headache, abdomen pain)     No 6. CAFFEINE ABUSE: "Do you drink caffeinated beverages, and how much each day?" (e.g., coffee, tea,  colas)     no 7. ALCOHOL USE OR SUBSTANCE USE (DRUG USE): "Do you drink alcohol or use any illegal drugs?"     no 8. OTHER SYMPTOMS: "Do you have any other symptoms?"  (e.g., difficulty breathing)     no  Protocols used: Insomnia-A-AH

## 2022-04-22 NOTE — Assessment & Plan Note (Signed)
New since hospital discharge. Likely multifactorial 2/2 stress from recent surgery and post-procedure complications as well as poor sleep hygiene. Also with h/o OSA, per chart review was previously on CPAP though patient reports no prior h/o sleep study, will assess further at follow up. Reviewed appropriate sleep hygiene, stress reduction. Recommend melatonin. F/u in 1 month.

## 2022-04-22 NOTE — Progress Notes (Signed)
    SUBJECTIVE:   CHIEF COMPLAINT / HPI:   HOSPITAL FOLLOW UP Hospital/facility: [facility] 8/8-8/10 Diagnosis: symptomatic L carotid stenosis - complicated by symptomatic hypotension requiring dopamine infusion and urinary retention s/p Foley. Procedures/tests:   L carotid artery stenting 8/8 Consultants: Vasc Surg New medications:  - flomax - just started yesterday - stopped maxzide Discharge instructions:   Status:  - subsequent ED visit yesterday for HTN, SBP up to 190 at home. 178/71 in ED. Given amlodipine. Tolerating well.  - urinating well. Some nocturia.  - some insomnia since discharge, see below. - checking BP at home with wrist cuff.  - f/u with Vasc Surg on Monday.  INSOMNIA Duration: 1 week Satisfied with sleep quality: no Difficulty falling asleep: yes Difficulty staying asleep: yes Waking a few hours after sleep onset: yes Early morning awakenings: yes Daytime hypersomnolence: yes. Has been taking afternoon naps Wakes feeling refreshed: no Good sleep hygiene: no. Goes to bed at 11pm. No bedtime routine. Sometimes watching TV before bed. Apnea: no Snoring: yes Depressed/anxious mood: some anxiousness Recent stress: yes History of sleep study: reports no. Per chart review, stopped using CPAP 02/2020. Treatments attempted: benadryl, not helpful    OBJECTIVE:   BP 138/62 (BP Location: Left Arm, Patient Position: Sitting, Cuff Size: Large)   Pulse (!) 52   Temp 98.6 F (37 C) (Oral)   Resp 16   Wt 200 lb 12.8 oz (91.1 kg)   SpO2 98%   BMI 30.53 kg/m   Gen: well appearing, in NAD Card: Reg rate Lungs: Comfortable WOB on RA Ext: WWP   ASSESSMENT/PLAN:   Carotid stenosis, symptomatic w/o infarct, right Doing well s/p stenting. Continue to follow with Vasc Surg, has upcoming appt.  Primary hypertension Slightly elevated today, likely amlodipine not reached full effect. Hasn't been taking home losartan, will switch back to losartan given added renal  benefits and h/o diabetes. Stop amlodipine. Continue to hold maxzide given frequent urination. F/u in 1 month for recheck. Will check labs at that time.   BPH (benign prostatic hyperplasia) With urinary retention in hospital requiring foley. Doing well on flomax, continue.  Insomnia New since hospital discharge. Likely multifactorial 2/2 stress from recent surgery and post-procedure complications as well as poor sleep hygiene. Also with h/o OSA, per chart review was previously on CPAP though patient reports no prior h/o sleep study, will assess further at follow up. Reviewed appropriate sleep hygiene, stress reduction. Recommend melatonin. F/u in 1 month.      Myles Gip, DO

## 2022-04-22 NOTE — Patient Instructions (Signed)
It was great to see you!  Our plans for today:  - See below for tips on sleeping better. - Start the losartan. Stop amlodipine.   Take care and seek immediate care sooner if you develop any concerns.   Dr. Ky Barban  - Try the following to help you sleep better:  - limit naps during the day  - no screens (TV, phone, tablet, computer) at least 1-2 hours before bedtime.  - have a quiet and dark sleeping environment.  - no large meals or drinks about 1 hour before bed.  - Avoid caffeine after 3pm.  - Exercise or move your body regularly every day.  - You can also try melatonin 5 mg over the counter. Take this 1-2 hours before bed. You can increase to '10mg'$  if this is not helpful. - If you are lying in bed for 30 mins-1 hour and aren't falling asleep, get out of bed and do something relaxing like reading (NO TV!) until you are tired.

## 2022-04-23 ENCOUNTER — Telehealth (INDEPENDENT_AMBULATORY_CARE_PROVIDER_SITE_OTHER): Payer: Self-pay

## 2022-04-23 ENCOUNTER — Ambulatory Visit (INDEPENDENT_AMBULATORY_CARE_PROVIDER_SITE_OTHER): Payer: Medicare Other | Admitting: Physician Assistant

## 2022-04-23 ENCOUNTER — Encounter: Payer: Self-pay | Admitting: Physician Assistant

## 2022-04-23 ENCOUNTER — Ambulatory Visit: Payer: Medicare Other | Admitting: Cardiology

## 2022-04-23 ENCOUNTER — Inpatient Hospital Stay: Payer: Medicare Other | Admitting: Physician Assistant

## 2022-04-23 VITALS — BP 151/75 | HR 68 | Temp 98.3°F | Resp 14 | Wt 199.0 lb

## 2022-04-23 DIAGNOSIS — G4733 Obstructive sleep apnea (adult) (pediatric): Secondary | ICD-10-CM | POA: Diagnosis not present

## 2022-04-23 DIAGNOSIS — I10 Essential (primary) hypertension: Secondary | ICD-10-CM | POA: Diagnosis not present

## 2022-04-23 DIAGNOSIS — E119 Type 2 diabetes mellitus without complications: Secondary | ICD-10-CM

## 2022-04-23 DIAGNOSIS — G4700 Insomnia, unspecified: Secondary | ICD-10-CM

## 2022-04-23 MED ORDER — ONETOUCH ULTRASOFT LANCETS MISC: 12 refills | Status: DC

## 2022-04-23 MED ORDER — ONETOUCH VERIO VI STRP: ORAL_STRIP | 12 refills | Status: DC

## 2022-04-23 NOTE — Telephone Encounter (Signed)
Pt states that he is having a terrible time sleeping.  Pt states that he has heard that the stent surgery causes breathing issues.  He wants more information on this as he can't continue to get less and less sleep.  Pt has appt on Monday.

## 2022-04-23 NOTE — Telephone Encounter (Signed)
As far as stent surgery, we didn't do any work on any organ that would cause breathing issues.  That is not an expected effect of his procedure.  If he just can't sleep, he needs to see his PCP.

## 2022-04-23 NOTE — Telephone Encounter (Signed)
Pt states that he is having a terrible time sleeping.  Pt states that he has heard that the stent surgery causes breathing issues.  He wants more information on this as he can't continue to get less and less sleep.  Pt has appt on Monday. Please advise

## 2022-04-23 NOTE — Progress Notes (Signed)
I,[name],acting as a Education administrator for [employer], PA-C.,have documented all relevant documentation on the behalf of Ernest Kirschner, PA-C,as directed by  Ernest Kirschner, PA-C while in the presence of Ernest Kirschner, PA-C.   Established patient visit   Patient: Ernest James Ernest James.   DOB: 1936-06-28   86 y.o. Male  MRN: [id number] Visit Date: 04/23/2022  Today's healthcare provider: Mikey Kirschner, PA-C   Chief Complaint  Patient presents with   Hypertension   Insomnia   Subjective    HPI  Hypertension, follow-up  BP Readings from Last 3 Encounters:  04/27/22 (!) 182/78  04/27/22 (!) 200/73  04/23/22 (!) 151/75   Wt Readings from Last 3 Encounters:  04/27/22 183 lb 3.2 oz (83.1 kg)  04/27/22 198 lb (89.8 kg)  04/23/22 199 lb (90.3 kg)     He was last seen for hypertension 1  day  ago (seen by Dr. Ky Barban).  BP at that visit was 138/62. Patient is back in the office today with his daughter to discuss blood pressure medications. He reports that he didn't give accurate information during his office visit yesterday.  Pt was unclear which medications we was taking and which he wasn't. Thinks he was taking the losartan the entire time.   Pt has concerns over anxiety, insomnia. Reports being unable to fall asleep even though his body is tired as his mind keeps him awake. Will frequently get out of bed and walk around to try to disrupt that cycle. Often falls asleep with light on as his wife is still awake.   Medications: Outpatient Medications Prior to Visit  Medication Sig   aspirin EC 81 MG tablet Take 81 mg by mouth daily.   atorvastatin (LIPITOR) 40 MG tablet Take 1 tablet by mouth once daily   Cholecalciferol (VITAMIN D3) 20 MCG TABS Take 1 tablet by mouth daily.    clopidogrel (PLAVIX) 75 MG tablet Take 1 tablet (75 mg total) by mouth daily.   Cyanocobalamin (VITAMIN B12) 1000 MCG TBCR Take 1 tablet by mouth daily.   hydrocortisone 2.5 % cream Apply  topically. (Patient not taking: Reported on 04/27/2022)   ketoconazole (NIZORAL) 2 % shampoo Apply 1 application topically 2 (two) times a week. (Patient not taking: Reported on 04/27/2022)   levothyroxine (SYNTHROID) 75 MCG tablet TAKE 1 TABLET BY MOUTH ONCE DAILY BEFORE BREAKFAST   losartan (COZAAR) 50 MG tablet Take 1 tablet (50 mg total) by mouth daily.   meloxicam (MOBIC) 7.5 MG tablet Take 1 tablet (7.5 mg total) by mouth daily as needed for pain.   metFORMIN (GLUCOPHAGE-XR) 500 MG 24 hr tablet Take 1 tablet by mouth once daily with breakfast   Multiple Vitamins-Minerals (MULTIVITAMIN ADULT PO) Take 1 tablet by mouth daily.    nystatin cream (MYCOSTATIN) Apply topically 2 (two) times daily.   tamsulosin (FLOMAX) 0.4 MG CAPS capsule Take 1 capsule (0.4 mg total) by mouth daily.   triamcinolone cream (KENALOG) 0.1 % APPLY TO AFFECTED AREAS TWICE DAILY UNTIL IMPROVED THEN DISCONTINUE (Patient not taking: Reported on 04/27/2022)   [DISCONTINUED] glucose blood (ONETOUCH VERIO) test strip Test TID. Use as instructed   [DISCONTINUED] Lancets (ONETOUCH ULTRASOFT) lancets Test TID as instructed   No facility-administered medications prior to visit.    Review of Systems  Constitutional:  Positive for fatigue. Negative for appetite change, chills and fever.  Respiratory:  Negative for chest tightness, shortness of breath and wheezing.   Cardiovascular:  Negative for chest pain and palpitations.  Gastrointestinal:  Negative for abdominal pain, nausea and vomiting.  Psychiatric/Behavioral:  Positive for sleep disturbance.       Objective    BP (!) 151/75 (BP Location: Left Arm, Patient Position: Sitting, Cuff Size: Normal)   Pulse 68   Temp 98.3 F (36.8 C) (Oral)   Resp 14   Wt 199 lb (90.3 kg)   SpO2 97% Comment: room air  BMI 30.26 kg/m    Today's Vitals   04/23/22 1347 04/23/22 1351 04/23/22 1443  BP: (!) 155/61 (!) 146/65 (!) 151/75  Pulse: 68    Resp: 14    Temp: 98.3 F (36.8  C)    TempSrc: Oral    SpO2: 97%    Weight: 199 lb (90.3 kg)     Body mass index is 30.26 kg/m.   Physical Exam Constitutional:      General: He is awake.     Appearance: He is well-developed.  HENT:     Head: Normocephalic.  Eyes:     Conjunctiva/sclera: Conjunctivae normal.  Cardiovascular:     Rate and Rhythm: Normal rate and regular rhythm.     Heart sounds: Normal heart sounds.  Pulmonary:     Effort: Pulmonary effort is normal.     Breath sounds: Normal breath sounds.  Musculoskeletal:     Right lower leg: Edema present.     Left lower leg: Edema present.  Skin:    General: Skin is warm.  Neurological:     Mental Status: He is alert and oriented to person, place, and time.  Psychiatric:        Attention and Perception: Attention normal.        Mood and Affect: Mood normal.        Speech: Speech normal.        Behavior: Behavior is cooperative.     No results found for any visits on 04/23/22.  Assessment & Plan     Problem List Items Addressed This Visit       Cardiovascular and Mediastinum   Primary hypertension - Primary    As we are unsure which meds are on board -- advised starting with Losartan 50 mg daily, f/u 1 week. If pressures still elevated, consider adding back hctz-- previously was taking maxide, the double diuretic might have been too much.  Pt is experiencing ankle/foot swelling without diuretic.  For now d/c amlodipine         Respiratory   OSA (obstructive sleep apnea)    Historically, pt stopped using pap 6/21. Will need repeat sleep study at home.        Relevant Orders   Ambulatory referral to Sleep Studies     Endocrine   Diabetes mellitus without complication ([place])    Pulled for refill of test strips and lancets. Pt also needs new meter, will check with insurance which are covered as he has a specific machine in mind      Relevant Medications   Lancets (ONETOUCH ULTRASOFT) lancets   glucose blood (ONETOUCH VERIO) test  strip     Other   Insomnia    Reassured symptoms are not from surgery. Advised high levels of anxiety can disrupt sleep patterns. Long discussion on ways to reduce anxiety, improve sleep hygiene. Recommended staying consistent with 2-5 mg of melatonin at night.       Relevant Orders   Ambulatory referral to Sleep Studies     Return in about 1 week (around 04/30/2022) for hypertension.  I, Ernest Kirschner, PA-C have reviewed all documentation for this visit. The documentation on  04/23/2022 for the exam, diagnosis, procedures, and orders are all accurate and complete.  Ernest Kirschner, PA-C [facility] [address] [place], [place], [address] Office: [phone] Fax: [place]

## 2022-04-23 NOTE — Telephone Encounter (Signed)
Spoke with pt's wife and she stated that they were at the PCP office right now and he would keep his follow up here as scheduled.  I let her know that Arna Medici did suggest he see his PCP

## 2022-04-24 ENCOUNTER — Other Ambulatory Visit (INDEPENDENT_AMBULATORY_CARE_PROVIDER_SITE_OTHER): Payer: Self-pay | Admitting: Vascular Surgery

## 2022-04-24 DIAGNOSIS — I6523 Occlusion and stenosis of bilateral carotid arteries: Secondary | ICD-10-CM

## 2022-04-26 NOTE — Progress Notes (Unsigned)
Patient ID: Ernest Reef Maleak Brazzel., male   DOB: 1936-05-05, 86 y.o.   MRN: [id number]  No chief complaint on file.   HPI [facility] Marlos Carmen. is a 86 y.o. male.    The patient is seen for follow up evaluation of carotid stenosis status post placement of a 10 x 8 x 40 exact stent with the use of the NAV-6 embolic protection device in the left carotid artery on 04/14/2022.  There were no post operative problems or complications related to the surgery.  The patient denies neck or incisional pain.  The patient denies interval amaurosis fugax. There is no recent history of TIA symptoms or focal motor deficits. There is no prior documented CVA.  The patient denies headache.  The patient is taking enteric-coated aspirin 81 mg daily.  No recent shortening of the patient's walking distance or new symptoms consistent with claudication.  No history of rest pain symptoms. No new ulcers or wounds of the lower extremities have occurred.  There is no history of DVT, PE or superficial thrombophlebitis. No recent episodes of angina or shortness of breath documented.     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[facility])     Past Surgical History:  Procedure Laterality Date   CAROTID PTA/STENT INTERVENTION Left 04/14/2022   Procedure: CAROTID PTA/STENT INTERVENTION;  Surgeon: Katha Cabal, MD;  Location: [facility];  Service: Cardiovascular;  Laterality: Left;   CIRCUMCISION     COLONOSCOPY WITH PROPOFOL N/A 03/31/2016   Procedure: COLONOSCOPY WITH PROPOFOL;  Surgeon: Lollie Sails, MD;  Location: [facility] ENDOSCOPY;  Service: Endoscopy;  Laterality: N/A;   MRI     SINUS SURGERY  1994   on left side   TONSILLECTOMY        No Known  Allergies  Current Outpatient Medications  Medication Sig Dispense Refill   aspirin EC 81 MG tablet Take 81 mg by mouth daily.     atorvastatin (LIPITOR) 40 MG tablet Take 1 tablet by mouth once daily 90 tablet 4   Cholecalciferol (VITAMIN D3) 20 MCG TABS Take 1 tablet by mouth daily.      clopidogrel (PLAVIX) 75 MG tablet Take 1 tablet (75 mg total) by mouth daily. 30 tablet 5   Cyanocobalamin (VITAMIN B12) 1000 MCG TBCR Take 1 tablet by mouth daily.     glucose blood (ONETOUCH VERIO) test strip Test TID. Use as instructed 100 each 12   hydrocortisone 2.5 % cream Apply topically.     ketoconazole (NIZORAL) 2 % shampoo Apply 1 application topically 2 (two) times a week.     Lancets (ONETOUCH ULTRASOFT) lancets Test TID as instructed 100 each 12   levothyroxine (SYNTHROID) 75 MCG tablet TAKE 1 TABLET BY MOUTH ONCE DAILY BEFORE BREAKFAST 90 tablet 4   losartan (COZAAR) 50 MG tablet Take 1 tablet (50 mg total) by mouth daily. 90 tablet 3   meloxicam (MOBIC) 7.5 MG tablet Take 1 tablet (7.5 mg total) by mouth daily as needed for pain. 60 tablet 2   metFORMIN (GLUCOPHAGE-XR) 500 MG 24 hr tablet Take 1 tablet by mouth once daily with breakfast 90 tablet 4   Multiple Vitamins-Minerals (MULTIVITAMIN ADULT PO) Take  1 tablet by mouth daily.      nystatin cream (MYCOSTATIN) Apply topically 2 (two) times daily.     tamsulosin (FLOMAX) 0.4 MG CAPS capsule Take 1 capsule (0.4 mg total) by mouth daily. 30 capsule 1   triamcinolone cream (KENALOG) 0.1 % APPLY TO AFFECTED AREAS TWICE DAILY UNTIL IMPROVED THEN DISCONTINUE     No current facility-administered medications for this visit.        Physical Exam There were no vitals taken for this visit. Gen:  WD/WN, NAD Skin: incision C/D/I     Assessment/Plan:  No problem-specific Assessment & Plan notes found for this encounter.      [name] 04/26/2022, 4:47 PM   This note was created with Dragon medical transcription system.  Any  errors from dictation are unintentional.

## 2022-04-27 ENCOUNTER — Encounter (INDEPENDENT_AMBULATORY_CARE_PROVIDER_SITE_OTHER): Payer: Self-pay | Admitting: Vascular Surgery

## 2022-04-27 ENCOUNTER — Ambulatory Visit (INDEPENDENT_AMBULATORY_CARE_PROVIDER_SITE_OTHER): Payer: Medicare Other

## 2022-04-27 ENCOUNTER — Encounter: Payer: Self-pay | Admitting: Family Medicine

## 2022-04-27 ENCOUNTER — Ambulatory Visit: Payer: Self-pay | Admitting: *Deleted

## 2022-04-27 ENCOUNTER — Ambulatory Visit (INDEPENDENT_AMBULATORY_CARE_PROVIDER_SITE_OTHER): Payer: Medicare Other | Admitting: Family Medicine

## 2022-04-27 ENCOUNTER — Ambulatory Visit (INDEPENDENT_AMBULATORY_CARE_PROVIDER_SITE_OTHER): Payer: Medicare Other | Admitting: Vascular Surgery

## 2022-04-27 VITALS — BP 200/73 | HR 67 | Resp 16 | Ht 70.0 in | Wt 198.0 lb

## 2022-04-27 VITALS — BP 182/78 | HR 64 | Resp 16 | Wt 183.2 lb

## 2022-04-27 DIAGNOSIS — I6523 Occlusion and stenosis of bilateral carotid arteries: Secondary | ICD-10-CM

## 2022-04-27 DIAGNOSIS — I708 Atherosclerosis of other arteries: Secondary | ICD-10-CM | POA: Diagnosis not present

## 2022-04-27 DIAGNOSIS — K551 Chronic vascular disorders of intestine: Secondary | ICD-10-CM

## 2022-04-27 DIAGNOSIS — G4700 Insomnia, unspecified: Secondary | ICD-10-CM

## 2022-04-27 DIAGNOSIS — I701 Atherosclerosis of renal artery: Secondary | ICD-10-CM | POA: Diagnosis not present

## 2022-04-27 DIAGNOSIS — I10 Essential (primary) hypertension: Secondary | ICD-10-CM | POA: Diagnosis not present

## 2022-04-27 DIAGNOSIS — E782 Mixed hyperlipidemia: Secondary | ICD-10-CM

## 2022-04-27 MED ORDER — AMLODIPINE BESYLATE 5 MG PO TABS: 5.0000 mg | ORAL_TABLET | Freq: Every day | ORAL | 0 refills | Status: DC

## 2022-04-27 NOTE — Assessment & Plan Note (Signed)
Improved since initiation of sleep hygiene and melatonin, continue. Awaiting sleep study.

## 2022-04-27 NOTE — Telephone Encounter (Signed)
  Chief Complaint: elevated BP Symptoms: elevated BP reading, general not feeling well- no specific complaints Frequency: today Pertinent Negatives: Patient denies blurred vision, chest pain, difficulty breathing, headache, weakness Disposition: '[]'$ ED /'[]'$ Urgent Care (no appt availability in office) / '[x]'$ Appointment(In office/virtual)/ '[]'$  [place] [facility]/ '[]'$ Home Care/ '[]'$ Refused Recommended Disposition /'[]'$ [place] [facility]/ '[]'$  Follow-up with PCP Additional Notes:

## 2022-04-27 NOTE — Telephone Encounter (Signed)
Reason for Disposition  Systolic BP  >= 381 OR Diastolic >= 771  Answer Assessment - Initial Assessment Questions 1. BLOOD PRESSURE: "What is the blood pressure?" "Did you take at least two measurements 5 minutes apart?"     194/84, 180/88,167/82,174/85,  2. ONSET: "When did you take your blood pressure?"     10:00, numbers at home are higher 3. HOW: "How did you take your blood pressure?" (e.g., automatic home BP monitor, visiting nurse)     [employer] cuff 4. HISTORY: "Do you have a history of high blood pressure?"     yes 5. MEDICINES: "Are you taking any medicines for blood pressure?" "Have you missed any doses recently?"     Yes- recent change in medication- ED added but  has stopped per office 6. OTHER SYMPTOMS: "Do you have any symptoms?" (e.g., blurred vision, chest pain, difficulty breathing, headache, weakness)     No other symptoms 7. PREGNANCY: "Is there any chance you are pregnant?" "When was your last menstrual period?"  Protocols used: Blood Pressure - High-A-AH

## 2022-04-27 NOTE — Progress Notes (Signed)
    SUBJECTIVE:   CHIEF COMPLAINT / HPI:   Hypertension: - Medications: losartan - Compliance: good - Checking BP at home: yes, has wrist cuff. 130s-200s. - Denies any SOB, CP, vision changes, LE edema, medication SEs, or symptoms of hypotension  Insomnia - seen previously 8/16 for same, present since hospital discharge. Thought multifactorial 2/2 stress from recent surgery and post-procedure complications as well as poor sleep hygiene. Recommended sleep hygiene, melatonin, stress reduction. Also with h/o OSA not on CPAP, referred for sleep study 8/17. - sleep quality better since initiating sleep hygiene measures, less TV, low lighting in bedroom, relaxing activities prior to bed.   OBJECTIVE:   BP (!) 182/78 (BP Location: Right Arm, Patient Position: Sitting, Cuff Size: Large)   Pulse 64   Resp 16   Wt 183 lb 3.2 oz (83.1 kg)   BMI 26.29 kg/m   Gen: well appearing, in NAD Card: Reg rate Lungs: Comfortable WOB on RA Ext: WWP, no edema   ASSESSMENT/PLAN:   Primary hypertension Remains elevated, will add back amlodipine. Daughter has arm cuff, recommended use over wrist cuff. Has appt already scheduled in 4 days with PCP.   Insomnia Improved since initiation of sleep hygiene and melatonin, continue. Awaiting sleep study.      Myles Gip, DO

## 2022-04-27 NOTE — Assessment & Plan Note (Signed)
Remains elevated, will add back amlodipine. Daughter has arm cuff, recommended use over wrist cuff. Has appt already scheduled in 4 days with PCP.

## 2022-04-28 ENCOUNTER — Ambulatory Visit: Payer: Self-pay

## 2022-04-28 ENCOUNTER — Encounter: Payer: Self-pay | Admitting: Physician Assistant

## 2022-04-28 NOTE — Assessment & Plan Note (Addendum)
As we are unsure which meds are on board -- advised starting with Losartan 50 mg daily, f/u 1 week. If pressures still elevated, consider adding back hctz-- previously was taking maxide, the double diuretic might have been too much.  Pt is experiencing ankle/foot swelling without diuretic.  For now d/c amlodipine

## 2022-04-28 NOTE — Telephone Encounter (Signed)
  Chief Complaint: difficulty sleeping and overwhelming  Symptoms: feeling overwhelming, shakiness, feeling anxious when laying in bed but unable to stay in bed.  Frequency: several days  Pertinent Negatives: NA Disposition: '[]'$ ED /'[]'$ Urgent Care (no appt availability in office) / '[x]'$ Appointment(In office/virtual)/ '[]'$  [place] [facility]/ '[]'$ Home Care/ '[]'$ Refused Recommended Disposition /'[]'$ [place] [facility]/ '[]'$  Follow-up with PCP Additional Notes: pt's daughter is concerned. Pt was restless last night and unable to sleep approx 1.5 hrs. Pt has been shakey today and wanting something to help him rest tonight because he doesn't think he can go thru another night like this. Called and spoke with Gu-[place], CMA about this. She advised scheduling an appt for AM with PCP. Advised daughter of this and scheduled appt for 0840 but depending on how pt does may end up going to ED.   Reason for Disposition  [1] MODERATE weakness (i.e., interferes with work, school, normal activities) AND [2] persists > 3 days  Answer Assessment - Initial Assessment Questions 1. DESCRIPTION: "Describe how you are feeling."     Overwhelming fatigued, dont feel like can go thru this another night 2. SEVERITY: "How bad is it?"  "Can you stand and walk?"   - MILD (0-3): Feels weak or tired, but does not interfere with work, school or normal activities.   - MODERATE (4-7): Able to stand and walk; weakness interferes with work, school, or normal activities.   - SEVERE (8-10): Unable to stand or walk; unable to do usual activities.     Mild to moderate  3. ONSET: "When did these symptoms begin?" (e.g., hours, days, weeks, months)     Several days  6. OTHER SYMPTOMS: "Do you have any other symptoms?" (e.g., chest pain, fever, cough, SOB, vomiting, diarrhea, bleeding, other areas of pain)     Difficulty sleeping, shakiness  Protocols used: Weakness (Generalized) and Fatigue-A-AH

## 2022-04-28 NOTE — Assessment & Plan Note (Signed)
Historically, pt stopped using pap 6/21. Will need repeat sleep study at home.

## 2022-04-28 NOTE — Assessment & Plan Note (Signed)
Reassured symptoms are not from surgery. Advised high levels of anxiety can disrupt sleep patterns. Long discussion on ways to reduce anxiety, improve sleep hygiene. Recommended staying consistent with 2-5 mg of melatonin at night.

## 2022-04-28 NOTE — Assessment & Plan Note (Signed)
Pulled for refill of test strips and lancets. Pt also needs new meter, will check with insurance which are covered as he has a specific machine in mind

## 2022-04-29 ENCOUNTER — Ambulatory Visit (INDEPENDENT_AMBULATORY_CARE_PROVIDER_SITE_OTHER): Payer: Medicare Other | Admitting: Family Medicine

## 2022-04-29 ENCOUNTER — Telehealth (INDEPENDENT_AMBULATORY_CARE_PROVIDER_SITE_OTHER): Payer: Self-pay | Admitting: Vascular Surgery

## 2022-04-29 ENCOUNTER — Encounter: Payer: Self-pay | Admitting: Family Medicine

## 2022-04-29 ENCOUNTER — Ambulatory Visit: Payer: Medicare Other | Admitting: Family Medicine

## 2022-04-29 VITALS — BP 178/72 | HR 67 | Temp 98.1°F | Resp 16 | Wt 192.0 lb

## 2022-04-29 DIAGNOSIS — E119 Type 2 diabetes mellitus without complications: Secondary | ICD-10-CM

## 2022-04-29 DIAGNOSIS — G4700 Insomnia, unspecified: Secondary | ICD-10-CM | POA: Diagnosis not present

## 2022-04-29 DIAGNOSIS — I10 Essential (primary) hypertension: Secondary | ICD-10-CM | POA: Diagnosis not present

## 2022-04-29 DIAGNOSIS — E039 Hypothyroidism, unspecified: Secondary | ICD-10-CM | POA: Diagnosis not present

## 2022-04-29 DIAGNOSIS — I701 Atherosclerosis of renal artery: Secondary | ICD-10-CM | POA: Diagnosis not present

## 2022-04-29 MED ORDER — TRAZODONE HCL 100 MG PO TABS: 100.0000 mg | ORAL_TABLET | Freq: Every day | ORAL | 3 refills | Status: DC

## 2022-04-29 NOTE — Telephone Encounter (Signed)
LVM for pt advising that CT ordered by Dr. Delana Meyer is ready to be scheduled. I left # of [phone] and asked pt to call us back at the office after appt was made for CT. He will need to have a f/u with Dr. Delana Meyer for results.

## 2022-04-29 NOTE — Progress Notes (Signed)
Established patient visit   Patient: Ernest James Ernest James.   DOB: 02-21-1936   86 y.o. Male  MRN: [id number] Visit Date: 04/29/2022  Today's healthcare provider: Lelon Huh, MD   Chief Complaint  Patient presents with   Insomnia   Subjective    Insomnia Treatments tried: melatonin. The treatment provided no relief.    Patient was seen for this problems on 04/27/2022. Recommended sleep hygiene, melatonin and stress reduction. States he has always been a poor sleeper, but has been much worse since having procedure to place carotid stent on 04/14/2022. He has great difficulty falling asleep, then wakes after just an hour or so. He has had issues with nocturia for years, but this in not what keeps him awake when he tires to get to sleep at night. Has was recently start back on tamsulosin due to urinary retention and has urology follow up schedule on 8-25. Has nearly zero nights of adequate sleep since then and has been extremely exhausted. He did get in a short nap yesterday and slept some last night for the first time in weeks.   Medications: Outpatient Medications Prior to Visit  Medication Sig   amLODipine (NORVASC) 5 MG tablet Take 1 tablet (5 mg total) by mouth daily.   aspirin EC 81 MG tablet Take 81 mg by mouth daily.   atorvastatin (LIPITOR) 40 MG tablet Take 1 tablet by mouth once daily   Carboxymethylcellulose Sodium (ARTIFICIAL TEARS OP) Apply to eye.   Cholecalciferol (VITAMIN D3) 20 MCG TABS Take 1 tablet by mouth daily.    clopidogrel (PLAVIX) 75 MG tablet Take 1 tablet (75 mg total) by mouth daily.   Cyanocobalamin (VITAMIN B12) 1000 MCG TBCR Take 1 tablet by mouth daily.   glucose blood (ONETOUCH VERIO) test strip Test TID. Use as instructed   Lancets (ONETOUCH ULTRASOFT) lancets Test TID as instructed   levothyroxine (SYNTHROID) 75 MCG tablet TAKE 1 TABLET BY MOUTH ONCE DAILY BEFORE BREAKFAST   losartan (COZAAR) 50 MG tablet Take 1 tablet (50 mg total) by  mouth daily.   melatonin 3 MG TABS tablet Take 6 mg by mouth at bedtime.   meloxicam (MOBIC) 7.5 MG tablet Take 1 tablet (7.5 mg total) by mouth daily as needed for pain.   metFORMIN (GLUCOPHAGE-XR) 500 MG 24 hr tablet Take 1 tablet by mouth once daily with breakfast   Multiple Vitamins-Minerals (MULTIVITAMIN ADULT PO) Take 1 tablet by mouth daily.    nystatin cream (MYCOSTATIN) Apply topically 2 (two) times daily.   tamsulosin (FLOMAX) 0.4 MG CAPS capsule Take 1 capsule (0.4 mg total) by mouth daily.   hydrocortisone 2.5 % cream Apply topically. (Patient not taking: Reported on 04/29/2022)   ketoconazole (NIZORAL) 2 % shampoo Apply 1 application topically 2 (two) times a week. (Patient not taking: Reported on 04/29/2022)   triamcinolone cream (KENALOG) 0.1 % APPLY TO AFFECTED AREAS TWICE DAILY UNTIL IMPROVED THEN DISCONTINUE (Patient not taking: Reported on 04/29/2022)   No facility-administered medications prior to visit.    Review of Systems  Psychiatric/Behavioral:  The patient has insomnia.        Objective    BP (!) 178/72 (BP Location: Left Arm, Patient Position: Sitting, Cuff Size: Large)   Pulse 67   Temp 98.1 F (36.7 C) (Oral)   Resp 16   Wt 192 lb (87.1 kg)   SpO2 98% Comment: room air  BMI 27.55 kg/m   Today's Vitals   04/29/22 0853 04/29/22 0901  BP: (!) 173/77 (!) 178/72  Pulse: 64 67  Resp: 16   Temp: 98.1 F (36.7 C)   TempSrc: Oral   SpO2: 98%   Weight: 192 lb (87.1 kg)    Body mass index is 27.55 kg/m.    Physical Exam    General: Appearance:    Well developed, well nourished male in no acute distress  Eyes:    PERRL, conjunctiva/corneas clear, EOM's intact       Lungs:     Clear to auscultation bilaterally, respirations unlabored  Heart:    Normal heart rate. Normal rhythm. No murmurs, rubs, or gallops.    MS:   All extremities are intact.    Neurologic:   Awake, alert, oriented x 3. No apparent focal neurological defect.         Assessment &  Plan     1. Insomnia, unspecified type Longstanding due to nocturia, but much worse with inability to fall asleep since recent vascular procedure. Failed all sleepy hygiene recommendations. Discusses prn medication versus daily medication. Will try traZODone (DESYREL) 100 MG tablet; Take 1 tablet (100 mg total) by mouth at bedtime.  Dispense: 30 tablet; Refill: 3  Consider prn sedating medication if not able to stay caught up on sleep on trazodone.   2. Hypothyroidism, unspecified type  - TSH - T4, free  3. Primary hypertension Uncontrolled, hctz was stopped while hospitalized. Amlodipine recently increased to '5mg'$ .  - CBC - Comprehensive metabolic panel  4. Diabetes mellitus without complication (HCC)  - Hemoglobin A1c  Addressed extensive list of chronic and acute medical problems today requiring 25 minutes reviewing his medical record, counseling patient regarding his conditions and coordination of care.        The entirety of the information documented in the History of Present Illness, Review of Systems and Physical Exam were personally obtained by me. Portions of this information were initially documented by the CMA and reviewed by me for thoroughness and accuracy.    Lelon Huh, MD  [facility] [phone] (phone) [phone] (fax)  [place]

## 2022-04-30 ENCOUNTER — Telehealth (INDEPENDENT_AMBULATORY_CARE_PROVIDER_SITE_OTHER): Payer: Self-pay

## 2022-04-30 ENCOUNTER — Encounter: Payer: Self-pay | Admitting: Urology

## 2022-04-30 ENCOUNTER — Ambulatory Visit (INDEPENDENT_AMBULATORY_CARE_PROVIDER_SITE_OTHER): Payer: Medicare Other | Admitting: Urology

## 2022-04-30 VITALS — BP 143/80 | HR 106 | Ht 69.0 in | Wt 194.0 lb

## 2022-04-30 DIAGNOSIS — R3911 Hesitancy of micturition: Secondary | ICD-10-CM

## 2022-04-30 DIAGNOSIS — N401 Enlarged prostate with lower urinary tract symptoms: Secondary | ICD-10-CM | POA: Diagnosis not present

## 2022-04-30 DIAGNOSIS — R339 Retention of urine, unspecified: Secondary | ICD-10-CM

## 2022-04-30 DIAGNOSIS — R351 Nocturia: Secondary | ICD-10-CM

## 2022-04-30 LAB — COMPREHENSIVE METABOLIC PANEL
ALT: 44 IU/L (ref 0–44)
AST: 40 IU/L (ref 0–40)
Albumin/Globulin Ratio: 1.7 (ref 1.2–2.2)
Albumin: 4.6 g/dL (ref 3.7–4.7)
Alkaline Phosphatase: 61 IU/L (ref 44–121)
BUN/Creatinine Ratio: 15 (ref 10–24)
BUN: 15 mg/dL (ref 8–27)
Bilirubin Total: 1.2 mg/dL (ref 0.0–1.2)
CO2: 26 mmol/L (ref 20–29)
Calcium: 10.3 mg/dL — ABNORMAL HIGH (ref 8.6–10.2)
Chloride: 100 mmol/L (ref 96–106)
Creatinine, Ser: 1.02 mg/dL (ref 0.76–1.27)
Globulin, Total: 2.7 g/dL (ref 1.5–4.5)
Glucose: 105 mg/dL — ABNORMAL HIGH (ref 70–99)
Potassium: 4.3 mmol/L (ref 3.5–5.2)
Sodium: 140 mmol/L (ref 134–144)
Total Protein: 7.3 g/dL (ref 6.0–8.5)
eGFR: 72 mL/min/{1.73_m2} (ref >59)

## 2022-04-30 LAB — CBC
Hematocrit: 41.4 % (ref 37.5–51.0)
Hemoglobin: 13.9 g/dL (ref 13.0–17.7)
MCH: 31.1 pg (ref 26.6–33.0)
MCHC: 33.6 g/dL (ref 31.5–35.7)
MCV: 93 fL (ref 79–97)
Platelets: 339 10*3/uL (ref 150–450)
RBC: 4.47 x10E6/uL (ref 4.14–5.80)
RDW: 12.5 % (ref 11.6–15.4)
WBC: 8.6 10*3/uL (ref 3.4–10.8)

## 2022-04-30 LAB — HEMOGLOBIN A1C
Est. average glucose Bld gHb Est-mCnc: 140 mg/dL
Hgb A1c MFr Bld: 6.5 % — ABNORMAL HIGH (ref 4.8–5.6)

## 2022-04-30 LAB — BLADDER SCAN AMB NON-IMAGING

## 2022-04-30 LAB — TSH: TSH: 3.41 u[IU]/mL (ref 0.450–4.500)

## 2022-04-30 LAB — T4, FREE: Free T4: 1.35 ng/dL (ref 0.82–1.77)

## 2022-04-30 MED ORDER — TAMSULOSIN HCL 0.4 MG PO CAPS: 0.4000 mg | ORAL_CAPSULE | Freq: Every day | ORAL | 11 refills | Status: DC

## 2022-04-30 NOTE — Progress Notes (Signed)
   04/30/2022 4:40 PM   Ernest James. 1935-12-16 [id number]  Reason for visit: Follow up recent urinary retention, history of BPH, nocturia  HPI: I saw Mr. Hippe and his daughter for the above issues.  I last saw him in November 2021 when he was having urinary symptoms of frequency, nocturia, and weak stream, as well as an elevated PVR of 200 mL and I recommended a cystoscopy and TRUS for further evaluation of possible outlet procedures, he never followed up.  He was recently hospitalized for a vascular procedure, and developed postprocedural retention with reported significant output with Foley placement, and Foley was removed the next day and he was able to void spontaneously.  Flomax was started.  He thinks the Flomax has improved his urinary symptoms.  PVR today is normal at 17 mL.  His primary issue is nocturia 3-5 times overnight which has been chronic.  PSA was normal at 1.7 in May 2023, and stable over the last 5 years.  Patient and his daughter had a number of questions about sleep quality today and I answered them to the best of my ability.  It sounds like this is probably unrelated to his nocturia as this has been going on long-term.  I recommended considering a sleep apnea study.  Extensive recent hospital notes reviewed.  A CT was ordered by vascular and is scheduled for 05/06/2022.  I will review those images regarding prostate volume, evaluation of bladder distention and any possible hydronephrosis, though unlikely with normal PVR today.  I recommended continuing the Flomax and this was refilled, and we could certainly consider outlet procedures in the future if persistent incomplete emptying.  We again discussed the multifactorial nature of nocturia, and we discussed behavioral strategies including avoiding bladder irritants, minimizing fluids before bedtime, double voiding, blood pressure control, sleep hygiene, and avoiding lower extremity edema.  Follow-up CT  scheduled 05/06/2022, call with results regarding prostate volume/urology issues Continue Flomax, refilled RTC 6 months PVR symptom check, sooner if problems  I spent 45 total minutes on the day of the encounter including pre-visit review of the medical record, face-to-face time with the patient, and post visit ordering of labs/imaging/tests.   Billey Co, [facility] [address], [place], [place] [address] [phone]

## 2022-04-30 NOTE — Patient Instructions (Signed)

## 2022-05-01 ENCOUNTER — Ambulatory Visit: Payer: Medicare Other | Admitting: Urology

## 2022-05-01 ENCOUNTER — Ambulatory Visit (INDEPENDENT_AMBULATORY_CARE_PROVIDER_SITE_OTHER): Payer: Medicare Other | Admitting: Family Medicine

## 2022-05-01 ENCOUNTER — Encounter: Payer: Self-pay | Admitting: Family Medicine

## 2022-05-01 VITALS — BP 163/62 | HR 63 | Temp 98.4°F | Wt 193.0 lb

## 2022-05-01 DIAGNOSIS — I701 Atherosclerosis of renal artery: Secondary | ICD-10-CM

## 2022-05-01 DIAGNOSIS — I10 Essential (primary) hypertension: Secondary | ICD-10-CM

## 2022-05-01 DIAGNOSIS — G4700 Insomnia, unspecified: Secondary | ICD-10-CM

## 2022-05-01 MED ORDER — TRAZODONE HCL 100 MG PO TABS: 50.0000 mg | ORAL_TABLET | Freq: Every day | ORAL | Status: DC

## 2022-05-01 NOTE — Progress Notes (Signed)
I,Roshena L Chambers,acting as a scribe for Lelon Huh, MD.,have documented all relevant documentation on the behalf of Lelon Huh, MD,as directed by  Lelon Huh, MD while in the presence of Lelon Huh, MD.   Established patient visit   Patient: Ernest James.   DOB: 04-Sep-1936   86 y.o. Male  MRN: [id number] Visit Date: 05/01/2022  Today's healthcare provider: Lelon Huh, MD   Chief Complaint  Patient presents with   Insomnia   Subjective    HPI  Follow up for insomnia:  The patient was last seen for this 2 days ago. Changes made at last visit include try traZODone (DESYREL) 100 MG tablet; Take 1 tablet (100 mg total) by mouth at bedtime.  He reports good compliance with treatment. Patient took first dose last night and slept well He feels that condition is Improved. He is not having side effects.   Also here to follow up for uncontrolled hypertension. He is in midst of extensive vascular work up including possible renal artery disease. He is scheduled for abdominal CTA on 05-06-2022 and follow up Dr. Delana Meyer on 05-25-2022 -----------------------------------------------------------------------------------------   Medications: Outpatient Medications Prior to Visit  Medication Sig   amLODipine (NORVASC) 5 MG tablet Take 1 tablet (5 mg total) by mouth daily.   aspirin EC 81 MG tablet Take 81 mg by mouth daily.   atorvastatin (LIPITOR) 40 MG tablet Take 1 tablet by mouth once daily   Carboxymethylcellulose Sodium (ARTIFICIAL TEARS OP) Apply to eye.   Cholecalciferol (VITAMIN D3) 20 MCG TABS Take 1 tablet by mouth daily.    clopidogrel (PLAVIX) 75 MG tablet Take 1 tablet (75 mg total) by mouth daily.   Cyanocobalamin (VITAMIN B12) 1000 MCG TBCR Take 1 tablet by mouth daily.   glucose blood (ONETOUCH VERIO) test strip Test TID. Use as instructed   Lancets (ONETOUCH ULTRASOFT) lancets Test TID as instructed   levothyroxine (SYNTHROID) 75 MCG  tablet TAKE 1 TABLET BY MOUTH ONCE DAILY BEFORE BREAKFAST   losartan (COZAAR) 50 MG tablet Take 1 tablet (50 mg total) by mouth daily.   melatonin 3 MG TABS tablet Take 6 mg by mouth at bedtime.   meloxicam (MOBIC) 7.5 MG tablet Take 1 tablet (7.5 mg total) by mouth daily as needed for pain.   metFORMIN (GLUCOPHAGE-XR) 500 MG 24 hr tablet Take 1 tablet by mouth once daily with breakfast   Multiple Vitamins-Minerals (MULTIVITAMIN ADULT PO) Take 1 tablet by mouth daily.    nystatin cream (MYCOSTATIN) Apply topically 2 (two) times daily.   tamsulosin (FLOMAX) 0.4 MG CAPS capsule Take 1 capsule (0.4 mg total) by mouth daily after supper.   traZODone (DESYREL) 100 MG tablet Take 1 tablet (100 mg total) by mouth at bedtime.   No facility-administered medications prior to visit.    Review of Systems  Constitutional:  Negative for appetite change, chills and fever.  Respiratory:  Negative for chest tightness, shortness of breath and wheezing.   Cardiovascular:  Negative for chest pain and palpitations.  Gastrointestinal:  Negative for abdominal pain, nausea and vomiting.       Objective    BP (!) 163/62 (BP Location: Left Arm, Patient Position: Sitting, Cuff Size: Large)   Pulse 63   Temp 98.4 F (36.9 C) (Oral)   Wt 193 lb (87.5 kg)   SpO2 99% Comment: room air  BMI 28.50 kg/m   Today's Vitals   05/01/22 1501 05/01/22 1505  BP: (!) 165/59 (!) 163/62  Pulse: (!) 59 63  Temp: 98.4 F (36.9 C)   TempSrc: Oral   SpO2: 99%   Weight: 193 lb (87.5 kg)    Body mass index is 28.5 kg/m.   Physical Exam   General appearance: Well developed, well nourished male, cooperative and in no acute distress Head: Normocephalic, without obvious abnormality, atraumatic Respiratory: Respirations even and unlabored, normal respiratory rate Extremities: All extremities are intact.  Skin: Skin color, texture, turgor normal. No rashes seen  Psych: Appropriate mood and affect. Neurologic: Mental  status: Alert, oriented to person, place, and time, thought content appropriate.    Assessment & Plan     1. Insomnia, unspecified type Slept well with first dose of trazodone last night. He was encouraged to take every night for the time being considering he is going through extensive medical evaluations. He can try just taking 1/2 tablet, but can take a whole one if he needs to  2. Renal artery stenosis (HCC) In midst of vascular work up by [employer].   3. Primary hypertension Uncontrolled. Likely exacerbated by extensive vascular disease. Unclear if he will be having invasive treatment for RAS. Will hold off an any changes to hypertensive medications until course of vascular treatment is determined.   Future Appointments  Date Time Provider [place]  05/06/2022 11:00 AM [facility]  05/25/2022  1:00 PM Schnier, Dolores Lory, MD [facility] None  05/29/2022  2:40 PM Birdie Sons, MD [facility]  11/05/2022  2:00 PM Billey Co, MD [facility] None  11/09/2022  9:40 AM [facility]-NURSE HEALTH ADVISOR [facility]         The entirety of the information documented in the History of Present Illness, Review of Systems and Physical Exam were personally obtained by me. Portions of this information were initially documented by the CMA and reviewed by me for thoroughness and accuracy.     Lelon Huh, MD  [facility] [phone] (phone) [phone] (fax)  [place]

## 2022-05-01 NOTE — Telephone Encounter (Signed)
Patient daughter called with concern with her father feeling tired,unbearable, and miserable from lack of sleep. The patient daughter stated that Monday was the worst night. Patient daughter informed that her father saw his PCP on Wednesday and was prescribe Trazodone to help to rest. Patient daughter wanted Dr Delana Meyer opinion on if he agrees with provider decision. I spoke with Dr Delana Meyer and he did recommended to to follow the PCP recommendations. He do understand their concern but from a vascular standpoint the lack of sleep and the carotid are unrelated. Patient daughter was made aware with medical advice and verbalized understanding.

## 2022-05-06 ENCOUNTER — Ambulatory Visit (INDEPENDENT_AMBULATORY_CARE_PROVIDER_SITE_OTHER): Payer: Medicare Other

## 2022-05-06 ENCOUNTER — Other Ambulatory Visit: Payer: Self-pay

## 2022-05-06 ENCOUNTER — Ambulatory Visit
Admission: RE | Admit: 2022-05-06 | Discharge: 2022-05-06 | Disposition: A | Discharge status: Home / Self Care | Payer: Medicare Other | Source: Ambulatory Visit | Attending: Vascular Surgery | Admitting: Vascular Surgery

## 2022-05-06 ENCOUNTER — Ambulatory Visit
Admission: EM | Admit: 2022-05-06 | Discharge: 2022-05-06 | Disposition: A | Discharge status: Home / Self Care | Payer: Medicare Other | Attending: Emergency Medicine | Admitting: Emergency Medicine

## 2022-05-06 DIAGNOSIS — I701 Atherosclerosis of renal artery: Secondary | ICD-10-CM

## 2022-05-06 DIAGNOSIS — R0602 Shortness of breath: Secondary | ICD-10-CM

## 2022-05-06 DIAGNOSIS — Z7982 Long term (current) use of aspirin: Secondary | ICD-10-CM | POA: Diagnosis not present

## 2022-05-06 DIAGNOSIS — Z7902 Long term (current) use of antithrombotics/antiplatelets: Secondary | ICD-10-CM | POA: Diagnosis not present

## 2022-05-06 DIAGNOSIS — G4700 Insomnia, unspecified: Secondary | ICD-10-CM | POA: Diagnosis not present

## 2022-05-06 DIAGNOSIS — G4733 Obstructive sleep apnea (adult) (pediatric): Secondary | ICD-10-CM | POA: Diagnosis not present

## 2022-05-06 DIAGNOSIS — E785 Hyperlipidemia, unspecified: Secondary | ICD-10-CM | POA: Diagnosis not present

## 2022-05-06 DIAGNOSIS — E1169 Type 2 diabetes mellitus with other specified complication: Secondary | ICD-10-CM | POA: Diagnosis not present

## 2022-05-06 DIAGNOSIS — K551 Chronic vascular disorders of intestine: Secondary | ICD-10-CM

## 2022-05-06 DIAGNOSIS — I150 Renovascular hypertension: Secondary | ICD-10-CM | POA: Diagnosis not present

## 2022-05-06 DIAGNOSIS — Z7984 Long term (current) use of oral hypoglycemic drugs: Secondary | ICD-10-CM | POA: Diagnosis not present

## 2022-05-06 DIAGNOSIS — I110 Hypertensive heart disease with heart failure: Secondary | ICD-10-CM | POA: Diagnosis not present

## 2022-05-06 DIAGNOSIS — I444 Left anterior fascicular block: Secondary | ICD-10-CM | POA: Diagnosis not present

## 2022-05-06 DIAGNOSIS — I2510 Atherosclerotic heart disease of native coronary artery without angina pectoris: Secondary | ICD-10-CM | POA: Diagnosis not present

## 2022-05-06 DIAGNOSIS — R079 Chest pain, unspecified: Secondary | ICD-10-CM | POA: Diagnosis not present

## 2022-05-06 DIAGNOSIS — I6529 Occlusion and stenosis of unspecified carotid artery: Secondary | ICD-10-CM | POA: Diagnosis not present

## 2022-05-06 DIAGNOSIS — R600 Localized edema: Secondary | ICD-10-CM | POA: Diagnosis not present

## 2022-05-06 DIAGNOSIS — I5030 Unspecified diastolic (congestive) heart failure: Secondary | ICD-10-CM | POA: Diagnosis not present

## 2022-05-06 DIAGNOSIS — R778 Other specified abnormalities of plasma proteins: Secondary | ICD-10-CM | POA: Insufficient documentation

## 2022-05-06 DIAGNOSIS — Z79899 Other long term (current) drug therapy: Secondary | ICD-10-CM | POA: Diagnosis not present

## 2022-05-06 DIAGNOSIS — N400 Enlarged prostate without lower urinary tract symptoms: Secondary | ICD-10-CM | POA: Diagnosis not present

## 2022-05-06 DIAGNOSIS — K5730 Diverticulosis of large intestine without perforation or abscess without bleeding: Secondary | ICD-10-CM | POA: Diagnosis not present

## 2022-05-06 DIAGNOSIS — I440 Atrioventricular block, first degree: Secondary | ICD-10-CM | POA: Diagnosis not present

## 2022-05-06 DIAGNOSIS — E039 Hypothyroidism, unspecified: Secondary | ICD-10-CM | POA: Diagnosis not present

## 2022-05-06 LAB — CBC WITH DIFFERENTIAL/PLATELET
Abs Immature Granulocytes: 0.02 10*3/uL (ref 0.00–0.07)
Basophils Absolute: 0.1 10*3/uL (ref 0.0–0.1)
Basophils Relative: 1 %
Eosinophils Absolute: 0.2 10*3/uL (ref 0.0–0.5)
Eosinophils Relative: 3 %
HCT: 40.5 % (ref 39.0–52.0)
Hemoglobin: 13.9 g/dL (ref 13.0–17.0)
Immature Granulocytes: 0 %
Lymphocytes Relative: 22 %
Lymphs Abs: 1.6 10*3/uL (ref 0.7–4.0)
MCH: 32 pg (ref 26.0–34.0)
MCHC: 34.3 g/dL (ref 30.0–36.0)
MCV: 93.1 fL (ref 80.0–100.0)
Monocytes Absolute: 0.5 10*3/uL (ref 0.1–1.0)
Monocytes Relative: 6 %
Neutro Abs: 5 10*3/uL (ref 1.7–7.7)
Neutrophils Relative %: 68 %
Platelets: 340 10*3/uL (ref 150–400)
RBC: 4.35 MIL/uL (ref 4.22–5.81)
RDW: 13 % (ref 11.5–15.5)
WBC: 7.4 10*3/uL (ref 4.0–10.5)
nRBC: 0 % (ref 0.0–0.2)

## 2022-05-06 LAB — COMPREHENSIVE METABOLIC PANEL
ALT: 41 U/L (ref 0–44)
AST: 40 U/L (ref 15–41)
Albumin: 4.2 g/dL (ref 3.5–5.0)
Alkaline Phosphatase: 51 U/L (ref 38–126)
Anion gap: 8 (ref 5–15)
BUN: 14 mg/dL (ref 8–23)
CO2: 27 mmol/L (ref 22–32)
Calcium: 9.6 mg/dL (ref 8.9–10.3)
Chloride: 101 mmol/L (ref 98–111)
Creatinine, Ser: 1.01 mg/dL (ref 0.61–1.24)
GFR, Estimated: >60 mL/min (ref >60)
Glucose, Bld: 137 mg/dL — ABNORMAL HIGH (ref 70–99)
Potassium: 3.6 mmol/L (ref 3.5–5.1)
Sodium: 136 mmol/L (ref 135–145)
Total Bilirubin: 1.1 mg/dL (ref 0.3–1.2)
Total Protein: 7.8 g/dL (ref 6.5–8.1)

## 2022-05-06 LAB — TROPONIN I (HIGH SENSITIVITY): Troponin I (High Sensitivity): 47 ng/L — ABNORMAL HIGH (ref <18)

## 2022-05-06 MED ORDER — IOHEXOL 350 MG/ML SOLN
100.0000 mL | Freq: Once | INTRAVENOUS | Status: AC | PRN
  Administered 2022-05-06: 100 mL via INTRAVENOUS

## 2022-05-06 NOTE — ED Triage Notes (Addendum)
States the feeling started today around 9:30.  Feels SOB at times and almost "as thought I'm being startled."  Recent carotid endarterectomy. Had abdominal CT scan done today.  Denies pain. Irregular pulse palpated in triage.

## 2022-05-06 NOTE — Discharge Instructions (Addendum)
Please go to the emergency department at [facility] for evaluation of your elevated cardiac enzyme.  Please go now.

## 2022-05-06 NOTE — ED Provider Notes (Signed)
[facility]    CSN: [id number] Arrival date & time: 05/06/22  1120      History   Chief Complaint Chief Complaint  Patient presents with   Shortness of Breath    HPI [facility] Ernest James. is a 86 y.o. male.   HPI  86 year old male here for evaluation of shortness of breath.  Patient reports that off and on for the last year he has been experiencing a symptom that he can only describe as shortness of breath.  He states he does not feel he has to gasp for air or have trouble taking in a deep breath but he has this sensation in his chest.  He also sometimes has a sensation of feeling startled but he denies any palpitations or chest pain.  This morning he developed the symptoms at approximately 930 and they seem to be more intense than before.  He denies having any symptoms at present.  Patient also denies sweating, dizziness, nausea, or numbness or tingling in any of his extremities.  He did have a CT scan of his abdomen pelvis to evaluate for renal artery stenosis as well as chronic mesenteric ischemia.  On 04/14/2022 the patient had a stent placed in his left carotid artery due to carotid stenosis.  He reports that following the surgery for 2 to 3 days he had an inability to sleep.  Even tried taking melatonin but was still unable to sleep.  He denies any sense of foreboding but states that he felt something that is hard to describe.  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[facility])     Patient Active Problem List   Diagnosis Date Noted   Renal artery stenosis ([address]) 04/27/2022   Chronic mesenteric ischemia ([place]) 04/27/2022   Carotid stenosis, symptomatic w/o infarct, right 04/14/2022   OSA (obstructive sleep apnea) 01/24/2020   1st  degree AV block 05/30/2019   Hyperlipidemia 12/16/2017   Insomnia 03/30/2017   BPH (benign prostatic hyperplasia) 05/27/2016   Carotid stenosis 11/27/2015   TIA (transient ischemic attack) 11/27/2015   Allergic rhinitis, seasonal 11/22/2015   Family history of early CAD 11/22/2015   Phimosis 01/29/2014   Diabetes mellitus without complication ([place]) 80/99/8338   Abnormal liver enzymes 03/24/2009   Pure hyperglyceridemia 03/20/2009   Hypothyroidism 09/18/2008   History of adenomatous polyp of colon 09/07/2004   Primary hypertension 02/16/2002   Esophagitis, reflux 02/16/2002    Past Surgical History:  Procedure Laterality Date   CAROTID PTA/STENT INTERVENTION Left 04/14/2022   Procedure: CAROTID PTA/STENT INTERVENTION;  Surgeon: Katha Cabal, MD;  Location: [place] [facility];  Service: Cardiovascular;  Laterality: Left;   CIRCUMCISION     COLONOSCOPY WITH PROPOFOL N/A 03/31/2016   Procedure: COLONOSCOPY WITH PROPOFOL;  Surgeon: Lollie Sails, MD;  Location[facility] END[facility]COPY;  Service: Endoscopy;  Laterality: N/A;   MRI     SINUS SURGERY  1994   on left side   TONSILLECTOMY         Home Medications    Prior to Admission medications   Medication Sig Start Date End Date Taking? Authorizing Provider  amLODipine (NORVASC) 5 MG tablet Take 1 tablet (5 mg total) by mouth daily. 04/27/22  Myles Gip, DO  aspirin EC 81 MG tablet Take 81 mg by mouth daily.    [provider]  atorvastatin (LIPITOR) 40 MG tablet Take 1 tablet by mouth once daily 07/16/21   Birdie Sons, MD  Carboxymethylcellulose Sodium (ARTIFICIAL TEARS OP) Apply to eye.    [provider]  Cholecalciferol (VITAMIN D3) 20 MCG TABS Take 1 tablet by mouth daily.     [provider]  clopidogrel (PLAVIX) 75 MG tablet Take 1 tablet (75 mg total) by mouth daily. 04/16/22   Schnier, Dolores Lory, MD  Cyanocobalamin (VITAMIN B12) 1000 MCG TBCR Take 1 tablet by mouth daily.     [provider]  glucose blood (ONETOUCH VERIO) test strip Test TID. Use as instructed 04/23/22   Mikey Kirschner, PA-C  Lancets [facility] ULTRASOFT) lancets Test TID as instructed 04/23/22   Mikey Kirschner, PA-C  levothyroxine (SYNTHROID) 75 MCG tablet TAKE 1 TABLET BY MOUTH ONCE DAILY BEFORE BREAKFAST 04/08/22   Birdie Sons, MD  losartan (COZAAR) 50 MG tablet Take 1 tablet (50 mg total) by mouth daily. 04/22/22   Myles Gip, DO  melatonin 3 MG TABS tablet Take 6 mg by mouth at bedtime.    [provider]  meloxicam (MOBIC) 7.5 MG tablet Take 1 tablet (7.5 mg total) by mouth daily as needed for pain. 02/06/22   Birdie Sons, MD  metFORMIN (GLUCOPHAGE-XR) 500 MG 24 hr tablet Take 1 tablet by mouth once daily with breakfast 10/01/21   Birdie Sons, MD  Multiple Vitamins-Minerals (MULTIVITAMIN ADULT PO) Take 1 tablet by mouth daily.  03/20/09   [provider]  nystatin cream (MYCOSTATIN) Apply topically 2 (two) times daily. 09/02/21   [provider]  tamsulosin (FLOMAX) 0.4 MG CAPS capsule Take 1 capsule (0.4 mg total) by mouth daily after supper. 04/30/22   Billey Co, MD  traZODone (DESYREL) 100 MG tablet Take 0.5-1 tablets (50-100 mg total) by mouth at bedtime. 05/01/22   Birdie Sons, MD    Family History Family History  Problem Relation Age of Onset   Alzheimer's disease Mother    CAD Father    Diabetes Sister        type 2    Social History Social History   Tobacco Use   Smoking status: Never    Passive exposure: Never   Smokeless tobacco: Never  Vaping Use   Vaping Use: Never used  Substance Use Topics   Alcohol use: No    Alcohol/week: 0.0 standard drinks of alcohol   Drug use: No     Allergies   Patient has no known allergies.   Review of Systems Review of Systems  Respiratory:  Positive for shortness of breath. Negative for wheezing.   Cardiovascular:  Negative for chest pain and palpitations.   Neurological:  Negative for dizziness, weakness, numbness and headaches.     Physical Exam Triage Vital Signs ED Triage Vitals  Enc Vitals Group     BP 05/06/22 1126 (!) 165/78     Pulse Rate 05/06/22 1126 92     Resp 05/06/22 1126 18     Temp 05/06/22 1126 98 F (36.7 C)     Temp Source 05/06/22 1126 Oral     SpO2 05/06/22 1126 98 %     Weight 05/06/22 1134 192 lb 14.4 oz (87.5 kg)     Height 05/06/22 1134 '5\' 9"'$  (1.753 m)     Head Circumference --  Peak Flow --      Pain Score 05/06/22 1134 0     Pain Loc --      Pain Edu? --      Excl. in [place]? --    No data found.  Updated Vital Signs BP (!) 165/78 (BP Location: Right Arm)   Pulse 92   Temp 98 F (36.7 C) (Oral)   Resp 18   Ht '5\' 9"'$  (1.753 m)   Wt 192 lb 14.4 oz (87.5 kg)   SpO2 98%   BMI 28.49 kg/m   Visual Acuity Right Eye Distance:   Left Eye Distance:   Bilateral Distance:    Right Eye Near:   Left Eye Near:    Bilateral Near:     Physical Exam Vitals and nursing note reviewed.  Constitutional:      Appearance: Normal appearance. He is not ill-appearing.  HENT:     Head: Normocephalic and atraumatic.  Neck:     Vascular: No carotid bruit.  Cardiovascular:     Rate and Rhythm: Normal rate and regular rhythm.     Pulses: Normal pulses.     Heart sounds: Normal heart sounds. No murmur heard.    No friction rub. No gallop.  Pulmonary:     Effort: Pulmonary effort is normal.     Breath sounds: Normal breath sounds. No wheezing, rhonchi or rales.  Musculoskeletal:     Cervical back: Normal range of motion and neck supple.  Skin:    General: Skin is warm and dry.     Capillary Refill: Capillary refill takes less than 2 seconds.     Findings: No erythema or rash.  Neurological:     General: No focal deficit present.     Mental Status: He is alert and oriented to person, place, and time.  Psychiatric:        Mood and Affect: Mood normal.        Behavior: Behavior normal.        Thought  Content: Thought content normal.        Judgment: Judgment normal.      UC Treatments / Results  Labs (all labs ordered are listed, but only abnormal results are displayed) Labs Reviewed  COMPREHENSIVE METABOLIC PANEL - Abnormal; Notable for the following components:      Result Value   Glucose, Bld 137 (*)    All other components within normal limits  TROPONIN I (HIGH SENSITIVITY) - Abnormal; Notable for the following components:   Troponin I (High Sensitivity) 47 (*)    All other components within normal limits  CBC WITH DIFFERENTIAL/PLATELET    EKG Sinus rhythm with first-degree AV block and ventricular rate of 86 bpm PR interval 234 ms QRS duration 90 ms QT/QTc 390/466 ms Left axis deviation with minimal voltage criteria for LVH.     Radiology DG Chest 2 View  Result Date: 05/06/2022 CLINICAL DATA:  Shortness of breath. EXAM: CHEST - 2 VIEW COMPARISON:  11/20/2015. FINDINGS: The heart size and mediastinal contours are within normal limits. Both lungs are clear. The visualized skeletal structures are unremarkable. IMPRESSION: No active cardiopulmonary disease. Electronically Signed   By: Misty Stanley M.D.   On: 05/06/2022 12:08    Procedures Procedures (including critical care time)  Medications Ordered in UC Medications - No data to display  Initial Impression / Assessment and Plan / UC Course  I have reviewed the triage vital signs and the nursing notes.  Pertinent labs & imaging  results that were available during my care of the patient were reviewed by me and considered in my medical decision making (see chart for details).   Patient is a pleasant 86 year old gentleman presenting for what he only described as episodes of shortness of breath that have been off and on for the past year.  He states this morning at 9:30 AM the symptoms seem to intensify.  This was just prior to him having a CT of his abdomen pelvis to evaluate renal artery stenosis as well as chronic  mesenteric ischemia.  He denies having any shortness of breath now.  He also denies any chest pain, palpitations, dizziness, nausea, or sweating.  No numbness or tingling in his extremities.  He has a hard time describing what the sensation was that he felt in his chest he can only describe the shortness of breath.  He denies this presents currently.  His physical exam does not reveal the presence of any bruits when auscultating the carotid arteries bilaterally.  Cardiopulmonary exam reveals S1-S2 heart sounds with regular rate and rhythm and lung sounds that are clear to auscultation in all fields.  All peripheral pulses are 2+.  Extremities are warm and dry with a less than 2-second capillary refill.  His EKG shows a first-degree AV block.  This was present on his EKG from 04/21/2022 as well.  Both showed left axis deviation as well as low voltage in the precordial leads.  No ST or T wave abnormalities appreciated.  No Q waves.  The etiology of the patient's symptoms is unclear.  I will check a chest x-ray, CBC, CMP, and troponin.  CBC is unremarkable.  White blood cell count is 7.4, RBCs 4.35, H&H 13.9 and 40.5, platelets 340.  Chest x-ray independently reviewed and evaluated by me.  Impression: The lung fields are well pneumatized.  Mediastinal contours appear normal.  No infiltrate or effusion noted.  Radiology overread is pending. Radiology impression states heart size and mediastinal contours are within normal limits and both lungs are clear.  The visualized skeletal structures are unremarkable.  No active cardiopulmonary disease.  CMP shows an elevated glucose of 137.  Sodium and potassium normal at 136 and 3.6 respectively.  BUN is 14 and creatinine is 1.01.  Transaminases are normal.  Patient's troponin is 47.  I reevaluated the patient and patient states he is not have any shortness of breath and he denies any chest pain at current.  I do not have a good reason for his troponin to be 47.  There  is no ST or T wave abnormalities appreciated on his EKG.  I do feel that he needs to be evaluated in the emergency department where he can have serial troponins and cardiology specialty consultation performed.  Patient has elected to go to [facility] in [place] via [place].  I do believe the patient is stable as he is not having any chest pain, shortness of breath, sweating, dizziness, nausea, and his vital signs are stable.    Final Clinical Impressions(s) / UC Diagnoses   Final diagnoses:  Elevated troponin I level  SOB (shortness of breath)     Discharge Instructions      Please go to the emergency department at [facility] for evaluation of your elevated cardiac enzyme.  Please go now.     ED Prescriptions   None    PDMP not reviewed this encounter.   Margarette Canada, NP 05/06/22 1309

## 2022-05-07 DIAGNOSIS — E119 Type 2 diabetes mellitus without complications: Secondary | ICD-10-CM | POA: Diagnosis not present

## 2022-05-07 DIAGNOSIS — R778 Other specified abnormalities of plasma proteins: Secondary | ICD-10-CM | POA: Diagnosis not present

## 2022-05-07 DIAGNOSIS — K551 Chronic vascular disorders of intestine: Secondary | ICD-10-CM | POA: Diagnosis not present

## 2022-05-07 DIAGNOSIS — I6521 Occlusion and stenosis of right carotid artery: Secondary | ICD-10-CM | POA: Diagnosis not present

## 2022-05-07 DIAGNOSIS — I440 Atrioventricular block, first degree: Secondary | ICD-10-CM | POA: Diagnosis not present

## 2022-05-07 DIAGNOSIS — I5189 Other ill-defined heart diseases: Secondary | ICD-10-CM | POA: Diagnosis not present

## 2022-05-07 DIAGNOSIS — R0602 Shortness of breath: Secondary | ICD-10-CM | POA: Diagnosis not present

## 2022-05-07 DIAGNOSIS — I10 Essential (primary) hypertension: Secondary | ICD-10-CM | POA: Diagnosis not present

## 2022-05-07 DIAGNOSIS — I701 Atherosclerosis of renal artery: Secondary | ICD-10-CM | POA: Diagnosis not present

## 2022-05-07 DIAGNOSIS — I2510 Atherosclerotic heart disease of native coronary artery without angina pectoris: Secondary | ICD-10-CM | POA: Diagnosis not present

## 2022-05-07 LAB — HEMOGLOBIN A1C: Hemoglobin A1C: 5.8

## 2022-05-08 DIAGNOSIS — I10 Essential (primary) hypertension: Secondary | ICD-10-CM | POA: Diagnosis not present

## 2022-05-08 DIAGNOSIS — R4781 Slurred speech: Secondary | ICD-10-CM | POA: Diagnosis not present

## 2022-05-08 DIAGNOSIS — I440 Atrioventricular block, first degree: Secondary | ICD-10-CM | POA: Diagnosis not present

## 2022-05-08 DIAGNOSIS — K551 Chronic vascular disorders of intestine: Secondary | ICD-10-CM | POA: Diagnosis not present

## 2022-05-08 DIAGNOSIS — E162 Hypoglycemia, unspecified: Secondary | ICD-10-CM | POA: Diagnosis not present

## 2022-05-08 DIAGNOSIS — I6521 Occlusion and stenosis of right carotid artery: Secondary | ICD-10-CM | POA: Diagnosis not present

## 2022-05-08 DIAGNOSIS — I2510 Atherosclerotic heart disease of native coronary artery without angina pectoris: Secondary | ICD-10-CM | POA: Diagnosis not present

## 2022-05-08 DIAGNOSIS — I701 Atherosclerosis of renal artery: Secondary | ICD-10-CM | POA: Diagnosis not present

## 2022-05-08 DIAGNOSIS — R0602 Shortness of breath: Secondary | ICD-10-CM | POA: Diagnosis not present

## 2022-05-08 DIAGNOSIS — R778 Other specified abnormalities of plasma proteins: Secondary | ICD-10-CM | POA: Diagnosis not present

## 2022-05-08 DIAGNOSIS — E119 Type 2 diabetes mellitus without complications: Secondary | ICD-10-CM | POA: Diagnosis not present

## 2022-05-08 DIAGNOSIS — E161 Other hypoglycemia: Secondary | ICD-10-CM | POA: Diagnosis not present

## 2022-05-08 DIAGNOSIS — R29810 Facial weakness: Secondary | ICD-10-CM | POA: Diagnosis not present

## 2022-05-12 ENCOUNTER — Telehealth (INDEPENDENT_AMBULATORY_CARE_PROVIDER_SITE_OTHER): Payer: Self-pay

## 2022-05-12 ENCOUNTER — Telehealth: Payer: Self-pay

## 2022-05-12 NOTE — Telephone Encounter (Signed)
Transition Care Management Follow-up Telephone Call Date of discharge and from where: [place] 05-07-22 Dx: [facility]  How have you been since you were released from the hospital? Feeling ok  Any questions or concerns? No  Items Reviewed: Did the pt receive and understand the discharge instructions provided? Yes  Medications obtained and verified? Yes  Other? No  Any new allergies since your discharge? No  Dietary orders reviewed? Yes Do you have support at home? Yes   Home Care and Equipment/Supplies: Were home health services ordered? no If so, what is the name of the agency? na  Has the agency set up a time to come to the patient's home? not applicable Were any new equipment or medical supplies ordered?  No What is the name of the medical supply agency? na Were you able to get the supplies/equipment? not applicable Do you have any questions related to the use of the equipment or supplies? No  Functional Questionnaire: (I = Independent and D = Dependent) ADLs: I  Bathing/Dressing- I  Meal Prep- I  Eating- I  Maintaining continence- I  Transferring/Ambulation- I  Managing Meds- I  Follow up appointments reviewed:  PCP Hospital f/u appt confirmed? Yes  Scheduled to see Dr Caryn Section on 05-15-22 @ 140pm. [facility] f/u appt confirmed? Yes  Scheduled to see Dr Delana Meyer on 05-25-22 @ 1pm. And unsure of doctor but another appt is 05-28-22 at [place] Are transportation arrangements needed? No  If their condition worsens, is the pt aware to call PCP or go to the Emergency Dept.? Yes Was the patient provided with contact information for the PCP's office or ED? Yes Was to pt encouraged to call back with questions or concerns? Yes

## 2022-05-12 NOTE — Telephone Encounter (Signed)
Made Tonya aware of Olene Craven recommendations and will talk to schnier about getting pt in sooner than the 18th.  [name] agreed if any more episodes occur she will take him to [facility] ED.

## 2022-05-12 NOTE — Telephone Encounter (Signed)
We can certainly see if his appointment can be moved up sooner with Dr. Delana Meyer, however, I would strongly recommend that her father go to [facility] ED if he has other further episode of TIA or stroke like behavior.  Strokes and/or seizures can have a multitude of origin points and may not be caused by his carotid or recent stent placement.  It is best that you seek emergent attention at the time it occurs for a full workup.  If you take him to [facility], we can also be called into evaluate him at that time.

## 2022-05-12 NOTE — Telephone Encounter (Signed)
[name] calls regarding pt having "tia or seizure activity on Friday 9/1.  She states that her father was hospitalized on 8/30 due to high troponin levels stayed over night levels came down heart looked ok he came home on 08/31 late evening.  On 9/1 for about 40 mins pt had mixed up speech and his mouth was droopy.  EMS was called and all vitals were normal.  Ernest James states a nurse friend recommend she call and get an appt with Dr Delana Meyer to discuss this further.  Please advise when and what type of appt is needed.

## 2022-05-13 ENCOUNTER — Telehealth: Payer: Self-pay

## 2022-05-13 NOTE — Telephone Encounter (Signed)
-----   Message from Billey Co, MD sent at 05/12/2022  9:42 AM EDT ----- Regarding: FW: CT results I reviewed his CT which shows an enlarged prostate but no bladder or kidney abnormalities, and no stones.  Recommend continuing the Flomax and following up as scheduled.  Nickolas Madrid, MD 05/12/2022   ----- Message ----- From: Billey Co, MD Sent: 05/12/2022  12:00 AM EDT To: Billey Co, MD Subject: CT results                                     Follow-up CT results regarding prostate and bladder evaluation, eval hydro. History of incomplete emptying  Nickolas Madrid, MD 04/30/2022

## 2022-05-13 NOTE — Telephone Encounter (Signed)
I called Tonya and let her know that Dr Delana Meyer agrees that if any more episodes arise she should take pt to the ED. She states verbal understanding.

## 2022-05-13 NOTE — Telephone Encounter (Signed)
Called pt's mobile number, no answer. Called pt's home number no answer, left detailed message per DPR.

## 2022-05-14 NOTE — Progress Notes (Unsigned)
Established patient visit  I,April Miller,acting as a scribe for Lelon Huh, MD.,have documented all relevant documentation on the behalf of Lelon Huh, MD,as directed by  Lelon Huh, MD while in the presence of Lelon Huh, MD.   Patient: Ernest James.   DOB: 1936/09/05   86 y.o. Male  MRN: [id number] Visit Date: 05/15/2022  Today's healthcare provider: Lelon Huh, MD   Chief Complaint  Patient presents with   Hospitalization Follow-up   Subjective    HPI   Follow up Hospitalization  Patient was admitted to [facility] on 05/06/2022 and discharged on 05/07/2022. He was treated for CAD and elevated troponin. Treatment for this included diureses Telephone follow up was done on 05/12/2022 He reports good compliance with treatment. He reports this condition is improved. He was scheduled for follow up [facility] cardiology with Dr. Truett Perna on 05/28/2022 ----------------------------------------------------------------------------------------- -  He also reports episode of slurred speech and right sided fascial dropping waxing and waning for several hours earlier this week. Symptoms have now completely resolved. He does have know extensive vascular disease, including carotid artery disease s/p left carotid stenting in June of this year. He is DUAP, but did miss of few days of aspirin last week.   He also reports trouble with constipation for the last few weeks. Miralax is effective, but he is not sure if can take that every day.   Medications: Outpatient Medications Prior to Visit  Medication Sig   amLODipine (NORVASC) 5 MG tablet Take 1 tablet (5 mg total) by mouth daily.   aspirin EC 81 MG tablet Take 81 mg by mouth daily.   atorvastatin (LIPITOR) 40 MG tablet Take 1 tablet by mouth once daily   Carboxymethylcellulose Sodium (ARTIFICIAL TEARS OP) Apply to eye.   Cholecalciferol (VITAMIN D3) 20 MCG TABS Take 1 tablet by mouth daily.    clopidogrel  (PLAVIX) 75 MG tablet Take 1 tablet (75 mg total) by mouth daily.   Cyanocobalamin (VITAMIN B12) 1000 MCG TBCR Take 1 tablet by mouth daily.   glucose blood (ONETOUCH VERIO) test strip Test TID. Use as instructed   Lancets (ONETOUCH ULTRASOFT) lancets Test TID as instructed   levothyroxine (SYNTHROID) 75 MCG tablet TAKE 1 TABLET BY MOUTH ONCE DAILY BEFORE BREAKFAST   losartan (COZAAR) 50 MG tablet Take 1 tablet (50 mg total) by mouth daily.   melatonin 3 MG TABS tablet Take 6 mg by mouth at bedtime.   meloxicam (MOBIC) 7.5 MG tablet Take 1 tablet (7.5 mg total) by mouth daily as needed for pain.   metFORMIN (GLUCOPHAGE-XR) 500 MG 24 hr tablet Take 1 tablet by mouth once daily with breakfast   Multiple Vitamins-Minerals (MULTIVITAMIN ADULT PO) Take 1 tablet by mouth daily.    nystatin cream (MYCOSTATIN) Apply topically 2 (two) times daily.   tamsulosin (FLOMAX) 0.4 MG CAPS capsule Take 1 capsule (0.4 mg total) by mouth daily after supper.   traZODone (DESYREL) 100 MG tablet Take 0.5-1 tablets (50-100 mg total) by mouth at bedtime.   No facility-administered medications prior to visit.    Review of Systems  Constitutional:  Negative for appetite change, chills and fever.  Respiratory:  Negative for chest tightness, shortness of breath and wheezing.   Cardiovascular:  Negative for chest pain and palpitations.  Gastrointestinal:  Negative for abdominal pain, nausea and vomiting.       Objective    BP 136/63 (BP Location: Left Arm, Patient Position: Sitting, Cuff Size: Normal)  Pulse 79   Resp 16   Wt 191 lb (86.6 kg)   SpO2 98%   BMI 28.21 kg/m    Physical Exam    General: Appearance:    Well developed, well nourished male in no acute distress  Eyes:    PERRL, conjunctiva/corneas clear, EOM's intact       Lungs:     Clear to auscultation bilaterally, respirations unlabored  Heart:    Normal heart rate. Normal rhythm. No murmurs, rubs, or gallops.    MS:   All extremities are  intact.    Neurologic:   Awake, alert, oriented x 3. No apparent focal neurological defect.         Assessment & Plan     1. Transient neurologic deficit Possibly TIA due to missing a few days of aspirin. Reinforced importance of taking ASA and clopidogrel consistently every day. Extensive vascular disease already being followed by Dr. Delana Meyer. Call or go to ER if any further episodes.   2. Constipation, unspecified constipation type Recommend he increase fluid intake and take fiber supplement and/or non-laxative stool softener every day, and may take Miralax as needed.   3. Elevated troponin Now completely asymptomatic. Has cardiology follow up scheduled at [facility] in 2 weeks.   Future Appointments  Date Time Provider [place]  05/25/2022  1:00 PM Schnier, Dolores Lory, MD [facility] None  08/03/2022  3:00 PM Birdie Sons, MD [facility]  11/05/2022  2:00 PM Billey Co, MD [facility] None  11/09/2022  9:40 AM BFP-NURSE HEALTH ADVISOR [facility] PEC        The entirety of the information documented in the History of Present Illness, Review of Systems and Physical Exam were personally obtained by me. Portions of this information were initially documented by the CMA and reviewed by me for thoroughness and accuracy.     Lelon Huh, MD  [facility] [phone] (phone) [phone] (fax)  [place]

## 2022-05-15 ENCOUNTER — Encounter: Payer: Self-pay | Admitting: Family Medicine

## 2022-05-15 ENCOUNTER — Ambulatory Visit (INDEPENDENT_AMBULATORY_CARE_PROVIDER_SITE_OTHER): Payer: Medicare Other | Admitting: Family Medicine

## 2022-05-15 VITALS — BP 136/63 | HR 79 | Resp 16 | Wt 191.0 lb

## 2022-05-15 DIAGNOSIS — K5900 Constipation, unspecified: Secondary | ICD-10-CM

## 2022-05-15 DIAGNOSIS — R778 Other specified abnormalities of plasma proteins: Secondary | ICD-10-CM

## 2022-05-15 DIAGNOSIS — I701 Atherosclerosis of renal artery: Secondary | ICD-10-CM | POA: Diagnosis not present

## 2022-05-15 DIAGNOSIS — R29818 Other symptoms and signs involving the nervous system: Secondary | ICD-10-CM

## 2022-05-15 DIAGNOSIS — M79674 Pain in right toe(s): Secondary | ICD-10-CM | POA: Diagnosis not present

## 2022-05-15 DIAGNOSIS — R600 Localized edema: Secondary | ICD-10-CM | POA: Diagnosis not present

## 2022-05-15 DIAGNOSIS — E1142 Type 2 diabetes mellitus with diabetic polyneuropathy: Secondary | ICD-10-CM | POA: Diagnosis not present

## 2022-05-15 DIAGNOSIS — M79675 Pain in left toe(s): Secondary | ICD-10-CM | POA: Diagnosis not present

## 2022-05-15 DIAGNOSIS — B351 Tinea unguium: Secondary | ICD-10-CM | POA: Diagnosis not present

## 2022-05-20 ENCOUNTER — Ambulatory Visit: Payer: Medicare Other | Admitting: Family Medicine

## 2022-05-21 ENCOUNTER — Ambulatory Visit (INDEPENDENT_AMBULATORY_CARE_PROVIDER_SITE_OTHER): Payer: PRIVATE HEALTH INSURANCE | Admitting: Nurse Practitioner

## 2022-05-21 ENCOUNTER — Encounter (INDEPENDENT_AMBULATORY_CARE_PROVIDER_SITE_OTHER): Payer: PRIVATE HEALTH INSURANCE

## 2022-05-25 ENCOUNTER — Encounter (INDEPENDENT_AMBULATORY_CARE_PROVIDER_SITE_OTHER): Payer: Self-pay | Admitting: Vascular Surgery

## 2022-05-25 ENCOUNTER — Ambulatory Visit (INDEPENDENT_AMBULATORY_CARE_PROVIDER_SITE_OTHER): Payer: Medicare Other | Admitting: Vascular Surgery

## 2022-05-25 VITALS — BP 164/70 | HR 82 | Resp 16 | Wt 190.2 lb

## 2022-05-25 DIAGNOSIS — E119 Type 2 diabetes mellitus without complications: Secondary | ICD-10-CM | POA: Diagnosis not present

## 2022-05-25 DIAGNOSIS — K551 Chronic vascular disorders of intestine: Secondary | ICD-10-CM

## 2022-05-25 DIAGNOSIS — I701 Atherosclerosis of renal artery: Secondary | ICD-10-CM

## 2022-05-25 DIAGNOSIS — E781 Pure hyperglyceridemia: Secondary | ICD-10-CM

## 2022-05-25 DIAGNOSIS — I6523 Occlusion and stenosis of bilateral carotid arteries: Secondary | ICD-10-CM

## 2022-05-25 DIAGNOSIS — I10 Essential (primary) hypertension: Secondary | ICD-10-CM | POA: Diagnosis not present

## 2022-05-25 NOTE — Progress Notes (Signed)
MRN : [id number]  Ernest James Ernest James. is a 86 y.o. ([birth year]) male who presents with chief complaint of check circulation.  History of Present Illness: ***  No outpatient medications have been marked as taking for the 05/25/22 encounter (Appointment) with Delana Meyer, Dolores Lory, MD.    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[facility])     Past Surgical History:  Procedure Laterality Date   CAROTID PTA/STENT INTERVENTION Left 04/14/2022   Procedure: CAROTID PTA/STENT INTERVENTION;  Surgeon: Katha Cabal, MD;  Location: [facility];  Service: Cardiovascular;  Laterality: Left;   CIRCUMCISION     COLONOSCOPY WITH PROPOFOL N/A 03/31/2016   Procedure: COLONOSCOPY WITH PROPOFOL;  Surgeon: Lollie Sails, MD;  Location: [facility];  Service: Endoscopy;  Laterality: N/A;   MRI     SINUS SURGERY  1994   on left side   TONSILLECTOMY      Social History Social History   Tobacco Use   Smoking status: Never    Passive exposure: Never   Smokeless tobacco: Never  Vaping Use   Vaping Use: Never used  Substance Use Topics   Alcohol use: No    Alcohol/week: 0.0 standard drinks of alcohol   Drug use: No    Family History Family History  Problem Relation Age of Onset   Alzheimer's disease Mother    CAD Father    Diabetes Sister        type 2    No Known Allergies   REVIEW OF SYSTEMS (Negative unless checked)  Constitutional: '[]'$ Weight loss  '[]'$ Fever  '[]'$ Chills Cardiac: '[]'$ Chest pain   '[]'$ Chest pressure   '[]'$ Palpitations   '[]'$ Shortness of breath when laying flat   '[]'$ Shortness of breath with exertion. Vascular:  '[x]'$ Pain in legs with walking   '[]'$ Pain in legs at rest  '[]'$ History of DVT   '[]'$ Phlebitis   '[]'$ Swelling in  legs   '[]'$ Varicose veins   '[]'$ Non-healing ulcers Pulmonary:   '[]'$ Uses home oxygen   '[]'$ Productive cough   '[]'$ Hemoptysis   '[]'$ Wheeze  '[]'$ COPD   '[]'$ Asthma Neurologic:  '[]'$ Dizziness   '[]'$ Seizures   '[]'$ History of stroke   '[]'$ History of TIA  '[]'$ Aphasia   '[]'$ Vissual changes   '[]'$ Weakness or numbness in arm   '[]'$ Weakness or numbness in leg Musculoskeletal:   '[]'$ Joint swelling   '[]'$ Joint pain   '[]'$ Low back pain Hematologic:  '[]'$ Easy bruising  '[]'$ Easy bleeding   '[]'$ Hypercoagulable state   '[]'$ Anemic Gastrointestinal:  '[]'$ Diarrhea   '[]'$ Vomiting  '[]'$ Gastroesophageal reflux/heartburn   '[]'$ Difficulty swallowing. Genitourinary:  '[]'$ Chronic kidney disease   '[]'$ Difficult urination  '[]'$ Frequent urination   '[]'$ Blood in urine Skin:  '[]'$ Rashes   '[]'$ Ulcers  Psychological:  '[]'$ History of anxiety   '[]'$  History of major depression.  Physical Examination  There were no vitals filed for this visit. There is no height or weight on file to calculate BMI. Gen: WD/WN, NAD Head: [place]/AT, No temporalis wasting.  Ear/Nose/Throat: Hearing grossly intact, nares w/o erythema or drainage Eyes: PER, EOMI, sclera nonicteric.  Neck: Supple, no masses.  No bruit or JVD.  Pulmonary:  Good air movement, no audible wheezing, no use of accessory muscles.  Cardiac: RRR, normal S1, S2, no Murmurs. Vascular:  mild trophic changes, no open wounds Vessel Right Left  Radial Palpable Palpable  PT Not Palpable Not Palpable  DP Not Palpable Not Palpable  Gastrointestinal: soft, non-distended. No guarding/no peritoneal signs.  Musculoskeletal: M/S 5/5 throughout.  No visible deformity.  Neurologic: CN 2-12 intact. Pain and light touch intact in extremities.  Symmetrical.  Speech is fluent. Motor exam as listed above. Psychiatric: Judgment intact, Mood & affect appropriate for pt's clinical situation. Dermatologic: No rashes or ulcers noted.  No changes consistent with cellulitis.   CBC Lab Results  Component Value Date   WBC 7.4 05/06/2022   HGB 13.9 05/06/2022   HCT  40.5 05/06/2022   MCV 93.1 05/06/2022   PLT 340 05/06/2022    BMET    Component Value Date/Time   NA 136 05/06/2022 1208   NA 140 04/29/2022 1000   K 3.6 05/06/2022 1208   CL 101 05/06/2022 1208   CO2 27 05/06/2022 1208   GLUCOSE 137 (H) 05/06/2022 1208   BUN 14 05/06/2022 1208   BUN 15 04/29/2022 1000   CREATININE 1.01 05/06/2022 1208   CALCIUM 9.6 05/06/2022 1208   GFRNONAA >60 05/06/2022 1208   GFRAA >60 12/23/2019 1352   Estimated Creatinine Clearance: 59.4 mL/min (by C-G formula based on SCr of 1.01 mg/dL).  COAG No results found for: "INR", "PROTIME"  Radiology CT Angio Abd/Pel w/ and/or w/o  Result Date: 05/06/2022 CLINICAL DATA:  Mesenteric ischemia, chronic Also Bilateral renal artery stenosis (by duplex) and hypertension EXAM: CTA ABDOMEN AND PELVIS WITHOUT AND WITH CONTRAST TECHNIQUE: Multidetector CT imaging of the abdomen and pelvis was performed using the standard protocol during bolus administration of intravenous contrast. Multiplanar reconstructed images and MIPs were obtained and reviewed to evaluate the vascular anatomy. RADIATION DOSE REDUCTION: This exam was performed according to the departmental dose-optimization program which includes automated exposure control, adjustment of the mA and/or kV according to patient size and/or use of iterative reconstruction technique. CONTRAST:  161m OMNIPAQUE IOHEXOL 350 MG/ML SOLN COMPARISON:  None Available. FINDINGS: VASCULAR Aorta: Moderate calcified atheromatous plaque. No aneurysm, dissection, or stenosis. Celiac: Calcified ostial plaque resulting in short segment stenosis of at least mild severity, patent distally. SMA: Calcified ostial plaque resulting in short segment stenosis of at least moderate severity, patent distally with classic distal branch anatomy. Renals: Single right, with calcified ostial plaque resulting in short segment stenosis of at least mild severity, patent distally. Duplicated left, superior  dominant, both with ostial calcified plaque but no definite high-grade stenosis, patent distally. IMA: Ostial stenosis related to atheromatous aortic wall plaque, of at least mild severity. Patent distally without evidence of aneurysm, dissection, vasculitis . Inflow: Mild tortuosity. Scattered calcified plaque in common and internal iliac arteries. No dissection or stenosis. Proximal Outflow: Mildly atheromatous, patent. Veins: Patent hepatic veins, portal vein, SM V, splenic vein, bilateral renal veins. Incomplete opacification of the iliac venous system and IVC, which appear grossly unremarkable. No venous pathology evident. Review of the MIP images confirms the above findings. NON-VASCULAR Lower chest: Coronary calcifications. 8 mm nodule in the anterior right upper lobe on image number 1, incompletely visualized. No pleural or pericardial effusion. Hepatobiliary: Scattered small calcified granulomas in the liver. No calcified gallstone. No biliary ductal dilatation. Pancreas: Unremarkable. No pancreatic ductal dilatation or surrounding inflammatory changes. Spleen: Normal in size.  Small calcified granuloma. Adrenals/Urinary Tract: No adrenal mass. Symmetric renal enhancement without focal lesion or evident urolithiasis. No hydronephrosis. Urinary bladder  is physiologically distended, mildly thick-walled. Stomach/Bowel: Stomach is nondistended. Small bowel decompressed. Normal appendix. The colon is nondilated. Multiple sigmoid diverticula without significant adjacent inflammatory change or abscess. Lymphatic: Borderline enlarged 1.2 cm right common femoral lymph node. No other pelvic, retroperitoneal or mesenteric adenopathy. Reproductive: Prostate enlargement. Other: No ascites.  No free air. Musculoskeletal: Multilevel lumbar spondylitic change L2-S1. Mild bilateral hip DJD. No acute findings. IMPRESSION: 1. Moderate SMA stenosis, as well as origin stenoses of the IMA and celiac axis of indeterminate  hemodynamic significance, conceivably adequate to account for occlusive mesenteric ischemia. 2. 8 mm right upper lobe nodule, incompletely visualized. Recommend CT chest for better risk characterization. 3. Coronary and Aortic Atherosclerosis (ICD10-170.0). 4. Sigmoid diverticulosis 5. Prostate enlargement 6. Single enlarged right common femoral lymph node 1.2 cm, possibly reactive but nonspecific. Electronically Signed   By: Lucrezia Europe M.D.   On: 05/06/2022 14:59   DG Chest 2 View  Result Date: 05/06/2022 CLINICAL DATA:  Shortness of breath. EXAM: CHEST - 2 VIEW COMPARISON:  11/20/2015. FINDINGS: The heart size and mediastinal contours are within normal limits. Both lungs are clear. The visualized skeletal structures are unremarkable. IMPRESSION: No active cardiopulmonary disease. Electronically Signed   By: Misty Stanley M.D.   On: 05/06/2022 12:08   VAS US CAROTID  Result Date: 04/27/2022 Carotid Arterial Duplex Study Patient Name:  [facility] Prayan Ulin.  Date of Exam:   04/27/2022 Medical Rec #: [id number]                      Accession #:    [id number] Date of Birth: 09-06-1936                      Patient Gender: M Patient Age:   26 years Exam Location:  [facility] Procedure:      VAS US CAROTID Referring Phys: [name] --------------------------------------------------------------------------------  Indications:   Carotid artery disease. Other Factors: Left carotid stent on 04/14/12. Performing Technologist: Blondell Reveal RT, RDMS, RVT  Examination Guidelines: A complete evaluation includes B-mode imaging, spectral Doppler, color Doppler, and power Doppler as needed of all accessible portions of each vessel. Bilateral testing is considered an integral part of a complete examination. Limited examinations for reoccurring indications may be performed as noted.  Right Carotid Findings: +----------+--------+--------+--------+------------------+--------+           PSV cm/sEDV  cm/sStenosisPlaque DescriptionComments +----------+--------+--------+--------+------------------+--------+ CCA Prox  83                      heterogenous               +----------+--------+--------+--------+------------------+--------+ CCA Mid   96      11                                         +----------+--------+--------+--------+------------------+--------+ CCA Distal91      10                                         +----------+--------+--------+--------+------------------+--------+ ICA Prox  108     10      1-39%   heterogenous               +----------+--------+--------+--------+------------------+--------+ ICA Mid   52  7                                          +----------+--------+--------+--------+------------------+--------+ ICA Distal57      13                                         +----------+--------+--------+--------+------------------+--------+ ECA       155                     heterogenous               +----------+--------+--------+--------+------------------+--------+ +----------+--------+-------+----------------+-------------------+           PSV cm/sEDV cmsDescribe        Arm Pressure (mmHG) +----------+--------+-------+----------------+-------------------+ [id number]             Multiphasic, WNL                    +----------+--------+-------+----------------+-------------------+ +---------+--------+--+--------+---------+ VertebralPSV cm/s34EDV cm/sAntegrade +---------+--------+--+--------+---------+ Resistive flow noted throughout the right carotid system. Left Carotid Findings: +----------+--------+--------+--------+------------------+---------------------+           PSV cm/sEDV cm/sStenosisPlaque DescriptionComments              +----------+--------+--------+--------+------------------+---------------------+ CCA Prox  79      12              heterogenous                             +----------+--------+--------+--------+------------------+---------------------+ CCA Mid   97      13                                                      +----------+--------+--------+--------+------------------+---------------------+ CCA Distal88      15                                stent; 0.61cm                                                             intraluminal diameter +----------+--------+--------+--------+------------------+---------------------+ ICA Prox  183     19                                stent; 0.3cm                                                              intraluminal diameter +----------+--------+--------+--------+------------------+---------------------+ ICA Mid   113     20                                                      +----------+--------+--------+--------+------------------+---------------------+  ICA Distal64      12                                                      +----------+--------+--------+--------+------------------+---------------------+ ECA       328     13      >50%                                            +----------+--------+--------+--------+------------------+---------------------+ +----------+--------+--------+----------------+-------------------+           PSV cm/sEDV cm/sDescribe        Arm Pressure (mmHG) +----------+--------+--------+----------------+-------------------+ [id number]             Multiphasic, WNL                    +----------+--------+--------+----------------+-------------------+ +---------+--------+--+--------+---------+ VertebralPSV cm/s36EDV cm/sAntegrade +---------+--------+--+--------+---------+  Summary: Right Carotid: Velocities in the right ICA are consistent with a 1-39% stenosis. Left Carotid: Patent left carotid artery stent with a mildly elevated velocity               that appears to be due to stent tapering.  *See table(s) above for measurements  and observations.  Electronically signed by [name] MD on 04/27/2022 at 6:06:10 PM.    Final      Assessment/Plan There are no diagnoses linked to this encounter.   [name], MD  05/25/2022 1:10 PM

## 2022-05-27 ENCOUNTER — Encounter (INDEPENDENT_AMBULATORY_CARE_PROVIDER_SITE_OTHER): Payer: Self-pay | Admitting: Vascular Surgery

## 2022-05-28 DIAGNOSIS — I2510 Atherosclerotic heart disease of native coronary artery without angina pectoris: Secondary | ICD-10-CM | POA: Diagnosis not present

## 2022-05-28 DIAGNOSIS — R42 Dizziness and giddiness: Secondary | ICD-10-CM | POA: Diagnosis not present

## 2022-05-28 DIAGNOSIS — R778 Other specified abnormalities of plasma proteins: Secondary | ICD-10-CM | POA: Diagnosis not present

## 2022-05-28 DIAGNOSIS — G459 Transient cerebral ischemic attack, unspecified: Secondary | ICD-10-CM | POA: Diagnosis not present

## 2022-05-29 ENCOUNTER — Ambulatory Visit: Payer: Medicare Other | Admitting: Family Medicine

## 2022-06-03 ENCOUNTER — Other Ambulatory Visit: Payer: Self-pay | Admitting: Cardiology

## 2022-06-03 DIAGNOSIS — I701 Atherosclerosis of renal artery: Secondary | ICD-10-CM

## 2022-06-04 DIAGNOSIS — G4733 Obstructive sleep apnea (adult) (pediatric): Secondary | ICD-10-CM | POA: Diagnosis not present

## 2022-06-22 DIAGNOSIS — R002 Palpitations: Secondary | ICD-10-CM | POA: Diagnosis not present

## 2022-06-29 DIAGNOSIS — I2510 Atherosclerotic heart disease of native coronary artery without angina pectoris: Secondary | ICD-10-CM | POA: Diagnosis not present

## 2022-06-29 DIAGNOSIS — Z23 Encounter for immunization: Secondary | ICD-10-CM | POA: Diagnosis not present

## 2022-06-30 ENCOUNTER — Other Ambulatory Visit: Payer: Self-pay | Admitting: Family Medicine

## 2022-06-30 DIAGNOSIS — Z23 Encounter for immunization: Secondary | ICD-10-CM | POA: Diagnosis not present

## 2022-06-30 DIAGNOSIS — D2271 Melanocytic nevi of right lower limb, including hip: Secondary | ICD-10-CM | POA: Diagnosis not present

## 2022-06-30 DIAGNOSIS — Z85828 Personal history of other malignant neoplasm of skin: Secondary | ICD-10-CM | POA: Diagnosis not present

## 2022-06-30 DIAGNOSIS — D485 Neoplasm of uncertain behavior of skin: Secondary | ICD-10-CM | POA: Diagnosis not present

## 2022-06-30 DIAGNOSIS — D2261 Melanocytic nevi of right upper limb, including shoulder: Secondary | ICD-10-CM | POA: Diagnosis not present

## 2022-06-30 DIAGNOSIS — D225 Melanocytic nevi of trunk: Secondary | ICD-10-CM | POA: Diagnosis not present

## 2022-06-30 DIAGNOSIS — L570 Actinic keratosis: Secondary | ICD-10-CM | POA: Diagnosis not present

## 2022-06-30 DIAGNOSIS — D044 Carcinoma in situ of skin of scalp and neck: Secondary | ICD-10-CM | POA: Diagnosis not present

## 2022-06-30 DIAGNOSIS — D2262 Melanocytic nevi of left upper limb, including shoulder: Secondary | ICD-10-CM | POA: Diagnosis not present

## 2022-06-30 NOTE — Telephone Encounter (Signed)
Medication Refill - Medication: amLODipine (NORVASC) 5 MG tablet  Has the patient contacted their pharmacy? Yes.     Preferred Pharmacy (with phone number or street name): [facility], [place] Phone:  [phone]  Fax:  [phone]      Has the patient been seen for an appointment in the last year OR does the patient have an upcoming appointment? Yes.    The patient states he is now out and needs a refill as soon as possible. Please assist patient further

## 2022-07-01 ENCOUNTER — Ambulatory Visit: Payer: Self-pay | Admitting: *Deleted

## 2022-07-01 MED ORDER — AMLODIPINE BESYLATE 5 MG PO TABS: 5.0000 mg | ORAL_TABLET | Freq: Every day | ORAL | 0 refills | Status: DC

## 2022-07-01 NOTE — Telephone Encounter (Addendum)
Pt called in that his amlodipine is not at the pharmacy [facility] in [place].   They don't have a record of it being sent in.  I checked and it did not transmit.  I resent it electronically.    Reason for Disposition  [1] Prescription prescribed recently is not at pharmacy AND [2] triager has access to patient's EMR AND [3] prescription is recorded in the EMR  Answer Assessment - Initial Assessment Questions 1. NAME of MEDICINE: "What medicine(s) are you calling about?"     Amlodipine 5 mg 2. QUESTION: "What is your question?" (e.g., double dose of medicine, side effect)     The [facility] in [place] says they do not have my rx for the Amlodipine.  3. PRESCRIBER: "Who prescribed the medicine?" Reason: if prescribed by specialist, call should be referred to that group.     Dr. Lelon Huh 4. SYMPTOMS: "Do you have any symptoms?" If Yes, ask: "What symptoms are you having?"  "How bad are the symptoms (e.g., mild, moderate, severe)     NA 5. PREGNANCY:  "Is there any chance that you are pregnant?" "When was your last menstrual period?"     N/A  Protocols used: Medication Question Call-A-AH  Chief Complaint: Amlodipine 5 mg not at the pharmacy and they have no record of it.   ThIt did not transmit the first time.   I resent it and it still did not transmit.   It was marked as "No Print" so I changed it to "Normal" and resent it to the [facility] in [place].   Symptoms: N/A Frequency: NA Pertinent Negatives: Patient denies N/A Disposition: '[]'$ ED /'[]'$ Urgent Care (no appt availability in office) / '[]'$ Appointment(In office/virtual)/ '[]'$  [facility]/ '[x]'$ Home Care/ '[]'$ Refused Recommended Disposition /'[]'$ [facility]/ '[]'$  Follow-up with PCP Additional Notes: Retransmitted the amlodipine rx to [facility].     I made sure it transmitted.  It was received 07/01/2022 at 2:11 PM at [facility]. I called the pt and let him know his prescription had transmitted this time and the  pharmacy has received it.   He thanked me for my help.

## 2022-07-01 NOTE — Telephone Encounter (Signed)
PT is completely out of this med and was in the ER at end of August for High BP. He has no medication and must go out of town 1st thing in the morning. Pls send to [facility] or contact pt appt with Dr Caryn Section last month.

## 2022-07-01 NOTE — Addendum Note (Signed)
Addended by: Marijo Conception on: 07/01/2022 02:13 PM   Modules accepted: Orders

## 2022-07-11 DIAGNOSIS — R42 Dizziness and giddiness: Secondary | ICD-10-CM | POA: Diagnosis not present

## 2022-07-14 ENCOUNTER — Telehealth: Payer: Self-pay | Admitting: Family Medicine

## 2022-07-14 NOTE — Telephone Encounter (Addendum)
Received results of sleep study that show he does have mild sleep apnea. Does he still have a CPAP or does he need order for new one? I'm not certain if it is severe enough for Medicare to cover a new CPAP.

## 2022-07-14 NOTE — Telephone Encounter (Signed)
Patient advised. He says he doesn't  have a CPAP. Patient is unsure if he wants to proceed with trying to get a CPAP since his sleep apnea is not severe. He would like to discuss this with Dr. Caryn Section during his next ov scheduled on 08/03/2022. Patient wants to know if its ok with Dr. Caryn Section to wait until then to discuss.  Dr. Caryn Section, will you be here on 11/27?

## 2022-07-15 DIAGNOSIS — E569 Vitamin deficiency, unspecified: Secondary | ICD-10-CM | POA: Diagnosis not present

## 2022-07-15 DIAGNOSIS — I10 Essential (primary) hypertension: Secondary | ICD-10-CM | POA: Diagnosis not present

## 2022-07-15 DIAGNOSIS — Z8673 Personal history of transient ischemic attack (TIA), and cerebral infarction without residual deficits: Secondary | ICD-10-CM | POA: Diagnosis not present

## 2022-07-15 DIAGNOSIS — R3915 Urgency of urination: Secondary | ICD-10-CM | POA: Diagnosis not present

## 2022-07-15 DIAGNOSIS — F5101 Primary insomnia: Secondary | ICD-10-CM | POA: Diagnosis not present

## 2022-07-15 DIAGNOSIS — R404 Transient alteration of awareness: Secondary | ICD-10-CM | POA: Diagnosis not present

## 2022-07-15 DIAGNOSIS — G4733 Obstructive sleep apnea (adult) (pediatric): Secondary | ICD-10-CM | POA: Diagnosis not present

## 2022-07-15 DIAGNOSIS — R413 Other amnesia: Secondary | ICD-10-CM | POA: Diagnosis not present

## 2022-07-15 DIAGNOSIS — E119 Type 2 diabetes mellitus without complications: Secondary | ICD-10-CM | POA: Diagnosis not present

## 2022-07-15 DIAGNOSIS — I6529 Occlusion and stenosis of unspecified carotid artery: Secondary | ICD-10-CM | POA: Diagnosis not present

## 2022-07-17 ENCOUNTER — Other Ambulatory Visit: Payer: Self-pay | Admitting: Neurology

## 2022-07-17 DIAGNOSIS — R404 Transient alteration of awareness: Secondary | ICD-10-CM

## 2022-07-17 DIAGNOSIS — R4182 Altered mental status, unspecified: Secondary | ICD-10-CM | POA: Diagnosis not present

## 2022-08-02 NOTE — Progress Notes (Signed)
MRN : [id number]  Ernest James. is a 86 y.o. (04-12-1936) male who presents with chief complaint of check carotid arteries.  History of Present Illness:   The patient is seen for follow up evaluation of carotid stenosis status post placement of a 10 x 8 x 40 exact stent with the use of the NAV-6 embolic protection device in the left carotid artery on 04/14/2022.    There were no post operative problems or complications related to the surgery.  The patient denies neck or incisional pain.  The patient denies interval amaurosis fugax. There is no recent history of TIA symptoms or focal motor deficits. There is no prior documented CVA.  The patient denies headache.  The patient is taking enteric-coated aspirin 81 mg daily with Plavix 75 mg p.o. daily.  Patient reports his blood pressure is typically about 409 mmHg systolic.  No abrupt spikes or episodes of hypertensive crisis.  Patient denies abdominal pain postprandially no weight loss.  No recent shortening of the patient's walking distance or new symptoms consistent with claudication.  No history of rest pain symptoms. No new ulcers or wounds of the lower extremities have occurred.  There is no history of DVT, PE or superficial thrombophlebitis. No recent episodes of angina or shortness of breath documented.  Duplex ultrasound of carotid arteries obtained today demonstrates 1 to 39% diameter reduction of the right internal carotid artery and a widely patent stent with uniform velocities of the left internal carotid artery  No outpatient medications have been marked as taking for the 08/03/22 encounter (Appointment) with Delana Meyer, Dolores Lory, MD.    Past Medical History:  Diagnosis Date   Arthritis    CAD (coronary artery disease)    Carotid artery stenosis    Celiac artery stenosis (HCC)    GERD (gastroesophageal reflux disease)    Hypertension    Phimosis    Pre-diabetes    Superior mesenteric artery  stenosis [facility])     Past Surgical History:  Procedure Laterality Date   CAROTID PTA/STENT INTERVENTION Left 04/14/2022   Procedure: CAROTID PTA/STENT INTERVENTION;  Surgeon: Katha Cabal, MD;  Location: [place] CV LAB;  Service: Cardiovascular;  Laterality: Left;   CIRCUMCISION     COLONOSCOPY WITH PROPOFOL N/A 03/31/2016   Procedure: COLONOSCOPY WITH PROPOFOL;  Surgeon: Lollie Sails, MD;  Location: [facility] ENDOSCOPY;  Service: Endoscopy;  Laterality: N/A;   MRI     SINUS SURGERY  1994   on left side   TONSILLECTOMY      Social History Social History   Tobacco Use   Smoking status: Never    Passive exposure: Never   Smokeless tobacco: Never  Vaping Use   Vaping Use: Never used  Substance Use Topics   Alcohol use: No    Alcohol/week: 0.0 standard drinks of alcohol   Drug use: No    Family History Family History  Problem Relation Age of Onset   Alzheimer's disease Mother    CAD Father    Diabetes Sister        type 2    No Known Allergies   REVIEW OF SYSTEMS (Negative unless checked)  Constitutional: '[]'$ Weight loss  '[]'$ Fever  '[]'$ Chills Cardiac: '[]'$ Chest pain   '[]'$ Chest pressure   '[]'$ Palpitations   '[]'$ Shortness of breath when laying flat   '[]'$ Shortness of breath with exertion. Vascular:  '[x]'$ Pain in legs with walking   '[]'$ Pain in legs at rest  '[]'$ History of DVT   '[]'$ Phlebitis   '[]'$   Swelling in legs   '[]'$ Varicose veins   '[]'$ Non-healing ulcers Pulmonary:   '[]'$ Uses home oxygen   '[]'$ Productive cough   '[]'$ Hemoptysis   '[]'$ Wheeze  '[]'$ COPD   '[]'$ Asthma Neurologic:  '[]'$ Dizziness   '[]'$ Seizures   '[]'$ History of stroke   '[]'$ History of TIA  '[]'$ Aphasia   '[]'$ Vissual changes   '[]'$ Weakness or numbness in arm   '[]'$ Weakness or numbness in leg Musculoskeletal:   '[]'$ Joint swelling   '[]'$ Joint pain   '[]'$ Low back pain Hematologic:  '[]'$ Easy bruising  '[]'$ Easy bleeding   '[]'$ Hypercoagulable state   '[]'$ Anemic Gastrointestinal:  '[]'$ Diarrhea   '[]'$ Vomiting  '[]'$ Gastroesophageal reflux/heartburn   '[]'$ Difficulty  swallowing. Genitourinary:  '[]'$ Chronic kidney disease   '[]'$ Difficult urination  '[]'$ Frequent urination   '[]'$ Blood in urine Skin:  '[]'$ Rashes   '[]'$ Ulcers  Psychological:  '[]'$ History of anxiety   '[]'$  History of major depression.  Physical Examination  There were no vitals filed for this visit. There is no height or weight on file to calculate BMI. Gen: WD/WN, NAD Head: [place]/AT, No temporalis wasting.  Ear/Nose/Throat: Hearing grossly intact, nares w/o erythema or drainage Eyes: PER, EOMI, sclera nonicteric.  Neck: Supple, no masses.  No bruit or JVD.  Pulmonary:  Good air movement, no audible wheezing, no use of accessory muscles.  Cardiac: RRR, normal S1, S2, no Murmurs. Vascular:  carotid bruit noted Vessel Right Left  Radial Palpable Palpable  Carotid  Palpable  Palpable  Gastrointestinal: soft, non-distended. No guarding/no peritoneal signs.  Musculoskeletal: M/S 5/5 throughout.  No visible deformity.  Neurologic: CN 2-12 intact. Pain and light touch intact in extremities.  Symmetrical.  Speech is fluent. Motor exam as listed above. Psychiatric: Judgment intact, Mood & affect appropriate for pt's clinical situation. Dermatologic: No rashes or ulcers noted.  No changes consistent with cellulitis.   CBC Lab Results  Component Value Date   WBC 7.4 05/06/2022   HGB 13.9 05/06/2022   HCT 40.5 05/06/2022   MCV 93.1 05/06/2022   PLT 340 05/06/2022    BMET    Component Value Date/Time   NA 136 05/06/2022 1208   NA 140 04/29/2022 1000   K 3.6 05/06/2022 1208   CL 101 05/06/2022 1208   CO2 27 05/06/2022 1208   GLUCOSE 137 (H) 05/06/2022 1208   BUN 14 05/06/2022 1208   BUN 15 04/29/2022 1000   CREATININE 1.01 05/06/2022 1208   CALCIUM 9.6 05/06/2022 1208   GFRNONAA >60 05/06/2022 1208   GFRAA >60 12/23/2019 1352   CrCl cannot be calculated (Patient's most recent lab result is older than the maximum 21 days allowed.).  COAG No results found for: "INR", "PROTIME"  Radiology No  results found.   Assessment/Plan 1. Carotid stenosis, symptomatic w/o infarct, right Recommend:  The patient is s/p successful left carotid stent on8/04/2022.  Duplex ultrasound preoperatively shows widely patent left stent with less than 30% right internal carotid artery stenosis.  Continue antiplatelet therapy as prescribed Continue management of CAD, HTN and Hyperlipidemia Healthy heart diet,  encouraged exercise at least 4 times per week  Follow up in 6 months with duplex ultrasound and physical exam based on the patient's carotid surgery and <50% stenosis of the right carotid artery   2. Renal artery stenosis (HCC) Recommend:  Given patient's arterial disease optimal control of the patient's hypertension is important. BP is acceptable today  The patient's vital signs and noninvasive studies support the renal artery stenosis is not significantly increased when compared to the previous study.  No invasive studies or intervention is  indicated at this time.  The patient will continue the current antihypertensive medications, no changes at this time.  The primary medical service will continue aggressive antihypertensive therapy as per the AHA guidelines   3. Chronic mesenteric ischemia (HCC) Recommend:  The patient has evidence of chronic asymptomatic mesenteric atherosclerosis.  The patient denies lifestyle limiting changes at this point in time.  Given the lack of symptoms no intervention is warranted at this time.   No further invasive studies, angiography or surgery at this time  The patient should continue walking and begin a more formal exercise program.   The patient should continue antiplatelet therapy and aggressive treatment of the lipid abnormalities  Patient should undergo noninvasive studies as ordered. The patient will follow up with me after the studies.   4. Primary hypertension Continue antihypertensive medications as already ordered, these medications  have been reviewed and there are no changes at this time.  5. Mixed hyperlipidemia Continue statin as ordered and reviewed, no changes at this time    [name], MD  08/02/2022 4:07 PM

## 2022-08-03 ENCOUNTER — Ambulatory Visit (INDEPENDENT_AMBULATORY_CARE_PROVIDER_SITE_OTHER): Payer: Medicare Other | Admitting: Family Medicine

## 2022-08-03 ENCOUNTER — Ambulatory Visit (INDEPENDENT_AMBULATORY_CARE_PROVIDER_SITE_OTHER): Payer: Medicare Other | Admitting: Vascular Surgery

## 2022-08-03 ENCOUNTER — Encounter: Payer: Self-pay | Admitting: Family Medicine

## 2022-08-03 ENCOUNTER — Encounter (INDEPENDENT_AMBULATORY_CARE_PROVIDER_SITE_OTHER): Payer: Self-pay | Admitting: Vascular Surgery

## 2022-08-03 ENCOUNTER — Ambulatory Visit (INDEPENDENT_AMBULATORY_CARE_PROVIDER_SITE_OTHER): Payer: Medicare Other

## 2022-08-03 VITALS — BP 132/66 | HR 74 | Temp 98.0°F | Ht 69.0 in | Wt 189.4 lb

## 2022-08-03 VITALS — BP 137/77 | HR 78 | Resp 16 | Wt 189.0 lb

## 2022-08-03 DIAGNOSIS — E119 Type 2 diabetes mellitus without complications: Secondary | ICD-10-CM

## 2022-08-03 DIAGNOSIS — E782 Mixed hyperlipidemia: Secondary | ICD-10-CM | POA: Diagnosis not present

## 2022-08-03 DIAGNOSIS — I6521 Occlusion and stenosis of right carotid artery: Secondary | ICD-10-CM

## 2022-08-03 DIAGNOSIS — I10 Essential (primary) hypertension: Secondary | ICD-10-CM

## 2022-08-03 DIAGNOSIS — I6523 Occlusion and stenosis of bilateral carotid arteries: Secondary | ICD-10-CM

## 2022-08-03 DIAGNOSIS — I701 Atherosclerosis of renal artery: Secondary | ICD-10-CM

## 2022-08-03 DIAGNOSIS — K551 Chronic vascular disorders of intestine: Secondary | ICD-10-CM

## 2022-08-03 LAB — POCT GLYCOSYLATED HEMOGLOBIN (HGB A1C)
Est. average glucose Bld gHb Est-mCnc: 140
Hemoglobin A1C: 6.5 % — AB (ref 4.0–5.6)

## 2022-08-03 NOTE — Progress Notes (Signed)
Established patient visit   Patient: Ernest James Ernest James.   DOB: 16-Jan-1936   86 y.o. Male  MRN: [id number] Visit Date: 08/03/2022  Today's healthcare provider: Lelon Huh, MD   Ernest James as a scribe for Ernest Huh, MD.,have documented all relevant documentation on the behalf of Ernest Huh, MD,as directed by  Ernest Huh, MD while in the presence of Ernest Huh, MD.   Chief Complaint  Patient presents with   Follow-up    3 mo follow up    Subjective    HPI  Hypertension, follow-up  BP Readings from Last 3 Encounters:  08/03/22 132/66  08/03/22 137/77  05/25/22 (!) 164/70   Wt Readings from Last 3 Encounters:  08/03/22 189 lb 6.4 oz (85.9 kg)  08/03/22 189 lb (85.7 kg)  05/25/22 190 lb 3.2 oz (86.3 kg)     He was last seen for hypertension 3 months ago.  Management since that visit includes Uncontrolled. Likely exacerbated by extensive vascular disease. Unclear if he will be having invasive treatment for RAS. Will hold off an any changes to hypertensive medications until course of vascular treatment is determined.  .  He had previously been on triamterene/hctz 75/50 but was discontinued earlier this year due to hypoglycemia. He has had gradually worsening LE edema and consistently elevated home SBPs in the 150s since then. He reports good compliance with treatment. He is not having side effects.  He is following a Low fat diet. He is not exercising. He does not smoke.  Use of agents associated with hypertension: none.    Pertinent labs Lab Results  Component Value Date   CHOL 115 01/14/2022   HDL 45 01/14/2022   LDLCALC 37 01/14/2022   TRIG 212 (H) 01/14/2022   CHOLHDL 2.6 01/14/2022   Lab Results  Component Value Date   NA 136 05/06/2022   K 3.6 05/06/2022   CREATININE 1.01 05/06/2022   GFRNONAA >60 05/06/2022   GLUCOSE 137 (H) 05/06/2022   TSH 3.410 04/29/2022     The ASCVD Risk score (Arnett DK, et al., 2019)  failed to calculate for the following reasons:   The 2019 ASCVD risk score is only valid for ages 64 to 84  --------------------------------------------------------------------------------------------------- Diabetes Mellitus Type II, Follow-up  Lab Results  Component Value Date   HGBA1C 6.5 (H) 04/29/2022   HGBA1C 6.7 (H) 01/14/2022   HGBA1C 6.4 (A) 01/07/2021   Wt Readings from Last 3 Encounters:  08/03/22 189 lb 6.4 oz (85.9 kg)  08/03/22 189 lb (85.7 kg)  05/25/22 190 lb 3.2 oz (86.3 kg)   Last seen for diabetes 3 months ago.  He reports good compliance with treatment. He is not having side effects.  Symptoms: No fatigue No foot ulcerations  No appetite changes No nausea  No paresthesia of the feet  No polydipsia  No polyuria No visual disturbances   No vomiting     Home blood sugar records:  not checking at home   Episodes of hypoglycemia? No    Current insulin regiment: none  Most Recent Eye Exam: June 2023  Current exercise: none Current diet habits: well balanced  Pertinent Labs: Lab Results  Component Value Date   CHOL 115 01/14/2022   HDL 45 01/14/2022   LDLCALC 37 01/14/2022   TRIG 212 (H) 01/14/2022   CHOLHDL 2.6 01/14/2022   Lab Results  Component Value Date   NA 136 05/06/2022   K 3.6 05/06/2022   CREATININE 1.01  05/06/2022   GFRNONAA >60 05/06/2022     ---------------------------------------------------------------------------------------------------    Medications: Outpatient Medications Prior to Visit  Medication Sig   amLODipine (NORVASC) 5 MG tablet Take 1 tablet (5 mg total) by mouth daily.   aspirin EC 81 MG tablet Take 81 mg by mouth daily.   atorvastatin (LIPITOR) 40 MG tablet Take 1 tablet by mouth once daily   Carboxymethylcellulose Sodium (ARTIFICIAL TEARS OP) Apply to eye.   Cholecalciferol (VITAMIN D3) 20 MCG TABS Take 1 tablet by mouth daily.    clopidogrel (PLAVIX) 75 MG tablet Take 1 tablet (75 mg total) by mouth daily.    Cyanocobalamin (VITAMIN B12) 1000 MCG TBCR Take 1 tablet by mouth daily.   glucose blood (ONETOUCH VERIO) test strip Test TID. Use as instructed   Lancets (ONETOUCH ULTRASOFT) lancets Test TID as instructed   levothyroxine (SYNTHROID) 75 MCG tablet TAKE 1 TABLET BY MOUTH ONCE DAILY BEFORE BREAKFAST   losartan (COZAAR) 50 MG tablet Take 1 tablet (50 mg total) by mouth daily.   melatonin 3 MG TABS tablet Take 6 mg by mouth at bedtime.   meloxicam (MOBIC) 7.5 MG tablet Take 1 tablet (7.5 mg total) by mouth daily as needed for pain.   metFORMIN (GLUCOPHAGE-XR) 500 MG 24 hr tablet Take 1 tablet by mouth once daily with breakfast   Multiple Vitamins-Minerals (MULTIVITAMIN ADULT PO) Take 1 tablet by mouth daily.    nystatin cream (MYCOSTATIN) Apply topically 2 (two) times daily.   tamsulosin (FLOMAX) 0.4 MG CAPS capsule Take 1 capsule (0.4 mg total) by mouth daily after supper.   traZODone (DESYREL) 100 MG tablet Take 0.5-1 tablets (50-100 mg total) by mouth at bedtime.   No facility-administered medications prior to visit.    Review of Systems  Constitutional:  Negative for appetite change, chills and fever.  Respiratory:  Negative for chest tightness, shortness of breath and wheezing.   Cardiovascular:  Negative for chest pain and palpitations.  Gastrointestinal:  Negative for abdominal pain, nausea and vomiting.      Objective    BP 132/66 (BP Location: Right Arm, Patient Position: Sitting, Cuff Size: Large)   Pulse 74   Temp 98 F (36.7 C) (Oral)   Ht '5\' 9"'$  (1.753 m)   Wt 189 lb 6.4 oz (85.9 kg)   SpO2 98% Comment: room air  BMI 27.97 kg/m   Physical Exam   General: Appearance:    Well developed, well nourished male in no acute distress  Eyes:    PERRL, conjunctiva/corneas clear, EOM's intact       Lungs:     Clear to auscultation bilaterally, respirations unlabored  Heart:    Normal heart rate. Normal rhythm. No murmurs, rubs, or gallops.    MS:   All extremities are intact.   2+ bilateral leg edema, no erythema.   Neurologic:   Awake, alert, oriented x 3. No apparent focal neurological defect.        Results for orders placed or performed in visit on 08/03/22  POCT HgB A1C  Result Value Ref Range   Hemoglobin A1C 6.5 (A) 4.0 - 5.6 %   Est. average glucose Bld gHb Est-mCnc 140     Assessment & Plan     1. Diabetes mellitus without complication ([place]) Well controlled.  Continue current medications.    2. Primary hypertension Home SBP consistently elevated with some LE edema since triamterene/hctz put on hold.  - Renal function panel   Consider starting diuretic after reviewing  labs.   DM follow up in 4 months.      The entirety of the information documented in the History of Present Illness, Review of Systems and Physical Exam were personally obtained by me. Portions of this information were initially documented by the CMA and reviewed by me for thoroughness and accuracy.     Ernest Huh, MD  [facility] [phone] (phone) [phone] (fax)  [place]

## 2022-08-03 NOTE — Patient Instructions (Signed)
.   Please review the attached list of medications and notify my office if there are any errors.   . Please bring all of your medications to every appointment so we can make sure that our medication list is the same as yours.   

## 2022-08-04 ENCOUNTER — Encounter (INDEPENDENT_AMBULATORY_CARE_PROVIDER_SITE_OTHER): Payer: Self-pay | Admitting: Vascular Surgery

## 2022-08-04 LAB — RENAL FUNCTION PANEL
Albumin: 4.5 g/dL (ref 3.7–4.7)
BUN/Creatinine Ratio: 14 (ref 10–24)
BUN: 13 mg/dL (ref 8–27)
CO2: 24 mmol/L (ref 20–29)
Calcium: 10.1 mg/dL (ref 8.6–10.2)
Chloride: 100 mmol/L (ref 96–106)
Creatinine, Ser: 0.95 mg/dL (ref 0.76–1.27)
Glucose: 114 mg/dL — ABNORMAL HIGH (ref 70–99)
Phosphorus: 3.2 mg/dL (ref 2.8–4.1)
Potassium: 3.6 mmol/L (ref 3.5–5.2)
Sodium: 141 mmol/L (ref 134–144)
eGFR: 79 mL/min/{1.73_m2} (ref >59)

## 2022-08-06 MED ORDER — HYDROCHLOROTHIAZIDE 25 MG PO TABS: 25.0000 mg | ORAL_TABLET | Freq: Every day | ORAL | 1 refills | Status: DC

## 2022-08-06 NOTE — Addendum Note (Signed)
Addended by: Smitty Knudsen on: 08/06/2022 03:30 PM   Modules accepted: Orders

## 2022-08-07 ENCOUNTER — Ambulatory Visit (HOSPITAL_COMMUNITY)
Admission: RE | Admit: 2022-08-07 | Discharge: 2022-08-07 | Disposition: A | Discharge status: Home / Self Care | Payer: Medicare Other | Source: Ambulatory Visit | Attending: Neurology | Admitting: Neurology

## 2022-08-07 DIAGNOSIS — R404 Transient alteration of awareness: Secondary | ICD-10-CM | POA: Diagnosis not present

## 2022-08-07 DIAGNOSIS — R29818 Other symptoms and signs involving the nervous system: Secondary | ICD-10-CM | POA: Diagnosis not present

## 2022-08-07 DIAGNOSIS — J3489 Other specified disorders of nose and nasal sinuses: Secondary | ICD-10-CM | POA: Diagnosis not present

## 2022-08-07 DIAGNOSIS — G459 Transient cerebral ischemic attack, unspecified: Secondary | ICD-10-CM

## 2022-08-07 DIAGNOSIS — I6529 Occlusion and stenosis of unspecified carotid artery: Secondary | ICD-10-CM | POA: Diagnosis not present

## 2022-08-07 DIAGNOSIS — R930 Abnormal findings on diagnostic imaging of skull and head, not elsewhere classified: Secondary | ICD-10-CM | POA: Diagnosis not present

## 2022-08-07 HISTORY — DX: Transient cerebral ischemic attack, unspecified: G45.9

## 2022-08-11 DIAGNOSIS — D044 Carcinoma in situ of skin of scalp and neck: Secondary | ICD-10-CM | POA: Diagnosis not present

## 2022-08-14 DIAGNOSIS — S8002XA Contusion of left knee, initial encounter: Secondary | ICD-10-CM | POA: Diagnosis not present

## 2022-08-14 DIAGNOSIS — M25562 Pain in left knee: Secondary | ICD-10-CM | POA: Diagnosis not present

## 2022-08-14 DIAGNOSIS — M1712 Unilateral primary osteoarthritis, left knee: Secondary | ICD-10-CM | POA: Diagnosis not present

## 2022-08-14 DIAGNOSIS — E119 Type 2 diabetes mellitus without complications: Secondary | ICD-10-CM | POA: Diagnosis not present

## 2022-08-17 ENCOUNTER — Other Ambulatory Visit: Payer: Self-pay | Admitting: Family Medicine

## 2022-08-17 DIAGNOSIS — R413 Other amnesia: Secondary | ICD-10-CM | POA: Diagnosis not present

## 2022-08-17 DIAGNOSIS — E119 Type 2 diabetes mellitus without complications: Secondary | ICD-10-CM | POA: Diagnosis not present

## 2022-08-17 DIAGNOSIS — Z8673 Personal history of transient ischemic attack (TIA), and cerebral infarction without residual deficits: Secondary | ICD-10-CM | POA: Diagnosis not present

## 2022-08-17 DIAGNOSIS — E569 Vitamin deficiency, unspecified: Secondary | ICD-10-CM | POA: Diagnosis not present

## 2022-08-18 DIAGNOSIS — I10 Essential (primary) hypertension: Secondary | ICD-10-CM | POA: Diagnosis not present

## 2022-08-18 DIAGNOSIS — I6522 Occlusion and stenosis of left carotid artery: Secondary | ICD-10-CM | POA: Diagnosis not present

## 2022-08-18 DIAGNOSIS — I739 Peripheral vascular disease, unspecified: Secondary | ICD-10-CM | POA: Diagnosis not present

## 2022-08-24 ENCOUNTER — Other Ambulatory Visit: Payer: Self-pay | Admitting: Family Medicine

## 2022-08-25 NOTE — Telephone Encounter (Signed)
Rx- 08/17/22 #50- too soon (duplicate request) Requested Prescriptions  Pending Prescriptions Disp Refills   atorvastatin (LIPITOR) 40 MG tablet [Pharmacy Med Name: Atorvastatin Calcium 40 MG Oral Tablet] 90 tablet 0    Sig: Take 1 tablet by mouth once daily     Cardiovascular:  Antilipid - Statins Failed - 08/24/2022  5:34 PM      Failed - Lipid Panel in normal range within the last 12 months    Cholesterol, Total  Date Value Ref Range Status  01/14/2022 115 100 - 199 mg/dL Final   LDL Chol Calc (NIH)  Date Value Ref Range Status  01/14/2022 37 0 - 99 mg/dL Final   HDL  Date Value Ref Range Status  01/14/2022 45 >39 mg/dL Final   Triglycerides  Date Value Ref Range Status  01/14/2022 212 (H) 0 - 149 mg/dL Final         Passed - Patient is not pregnant      Passed - Valid encounter within last 12 months    Recent Outpatient Visits           3 weeks ago Diabetes mellitus without complication [facility])   [facility] Birdie Sons, MD   3 months ago Transient neurologic deficit   [facility] Birdie Sons, MD   3 months ago Renal artery stenosis [facility])   [facility] Birdie Sons, MD   3 months ago Insomnia, unspecified type   [facility] Birdie Sons, MD   4 months ago Primary hypertension   [place], Jake Church, DO       Future Appointments             In 2 months Diamantina Providence, Herbert Seta, MD [place]   In 3 months Fisher, Kirstie Peri, MD [facility], [place]

## 2022-08-28 DIAGNOSIS — J018 Other acute sinusitis: Secondary | ICD-10-CM | POA: Diagnosis not present

## 2022-09-03 ENCOUNTER — Other Ambulatory Visit: Payer: Self-pay | Admitting: Family Medicine

## 2022-09-03 DIAGNOSIS — G4700 Insomnia, unspecified: Secondary | ICD-10-CM

## 2022-09-18 DIAGNOSIS — J018 Other acute sinusitis: Secondary | ICD-10-CM | POA: Diagnosis not present

## 2022-09-24 ENCOUNTER — Other Ambulatory Visit: Payer: Self-pay | Admitting: Family Medicine

## 2022-09-24 NOTE — Telephone Encounter (Signed)
Requested Prescriptions  Pending Prescriptions Disp Refills   amLODipine (NORVASC) 5 MG tablet [Pharmacy Med Name: amLODIPine Besylate 5 MG Oral Tablet] 90 tablet 1    Sig: Take 1 tablet by mouth once daily     Cardiovascular: Calcium Channel Blockers 2 Passed - 09/24/2022  9:06 AM      Passed - Last BP in normal range    BP Readings from Last 1 Encounters:  08/03/22 132/66         Passed - Last Heart Rate in normal range    Pulse Readings from Last 1 Encounters:  08/03/22 74         Passed - Valid encounter within last 6 months    Recent Outpatient Visits           1 month ago Diabetes mellitus without complication [facility])   [facility] Birdie Sons, MD   4 months ago Transient neurologic deficit   [facility] Birdie Sons, MD   4 months ago Renal artery stenosis [facility])   [facility] Birdie Sons, MD   4 months ago Insomnia, unspecified type   [facility] Birdie Sons, MD   5 months ago Primary hypertension   [place], Jake Church, DO       Future Appointments             In 1 month Diamantina Providence, Herbert Seta, MD [place]   In 2 months Fisher, Kirstie Peri, MD [facility], [place]

## 2022-09-29 ENCOUNTER — Other Ambulatory Visit: Payer: Self-pay | Admitting: Family Medicine

## 2022-09-29 DIAGNOSIS — H2513 Age-related nuclear cataract, bilateral: Secondary | ICD-10-CM | POA: Diagnosis not present

## 2022-09-29 DIAGNOSIS — I10 Essential (primary) hypertension: Secondary | ICD-10-CM

## 2022-10-09 ENCOUNTER — Other Ambulatory Visit (INDEPENDENT_AMBULATORY_CARE_PROVIDER_SITE_OTHER): Payer: Self-pay | Admitting: Vascular Surgery

## 2022-10-09 ENCOUNTER — Other Ambulatory Visit: Payer: Self-pay | Admitting: *Deleted

## 2022-10-09 DIAGNOSIS — E119 Type 2 diabetes mellitus without complications: Secondary | ICD-10-CM

## 2022-10-15 DIAGNOSIS — R413 Other amnesia: Secondary | ICD-10-CM | POA: Diagnosis not present

## 2022-10-15 DIAGNOSIS — F5101 Primary insomnia: Secondary | ICD-10-CM | POA: Diagnosis not present

## 2022-10-15 DIAGNOSIS — G4733 Obstructive sleep apnea (adult) (pediatric): Secondary | ICD-10-CM | POA: Diagnosis not present

## 2022-10-15 DIAGNOSIS — E1142 Type 2 diabetes mellitus with diabetic polyneuropathy: Secondary | ICD-10-CM | POA: Diagnosis not present

## 2022-10-15 DIAGNOSIS — Z8673 Personal history of transient ischemic attack (TIA), and cerebral infarction without residual deficits: Secondary | ICD-10-CM | POA: Diagnosis not present

## 2022-10-16 ENCOUNTER — Telehealth: Payer: Self-pay

## 2022-10-16 NOTE — Progress Notes (Addendum)
Care Management & Coordination Services Pharmacy Team  Reason for Encounter: Appointment Reminder  Contacted patient to confirm in office appointment with Daron Offer, PharmD on 10/20/2022 at 11:00 am.  {US HC Outreach:[id number]}  Do you have any problems getting your medications? {yes/no:20286} If yes what types of problems are you experiencing? {Problems:27223}  What is your top health concern you would like to discuss at your upcoming visit?   Have you seen any other providers since your last visit with PCP? {yes/no:20286}   Chart review:  Recent office visits:  08/03/2022 Dr. Caryn Section MD (PCP) Start Hydrochlorothiazide 25 mg daily, Lab work completed - A1C- 6.5, return in 4 months 05/15/2022 Dr. Caryn Section MD (PCP) No medication changes noted, No Orders Placed 05/01/2022 Dr. Caryn Section MD (PCP)  Change trazodone to 1/2 tablet or whole tablet PRN 04/29/2022 Dr. Caryn Section MD (PCP) Start Trazodone 100 mg daily at bedtime 04/27/2022 Rory Percy DO (PCP Office)  Restart Amlodipine 5 mg daily 04/23/2022 Mikey Kirschner PA-C (PCP Office) No medication changes noted, Ambulatory referral to Sleep Studies , Return in about 1 week  04/22/2022 Rory Percy DO (PCP Office) Stop amlodipine, and restart Losartan, No Orders Placed Follow up in 1 month  Recent consult visits:  08/18/2022 Dr. Truett Perna MD (Cardiology) No Medication changes noted, return in 1 year 08/17/2022 Bethena Roys PA (Neurology) No Medication changes noted 08/14/2022 Dr. Posey Pronto MD  (Ortho Surgery)- Unable to see note 08/03/2022 Dr. Delana Meyer MD (Vascular Surgery) No medication Changes noted 07/15/2022 Dr. Manuella Ghazi MD (Neurology) Unable to see note 05/28/2022 Dr. Truett Perna MD (Cardiology)Increase losartan to 75 mg daily, Referral to Neurology  05/25/2022 Dr. Delana Meyer MD (Vascular Surgery) No medication Changes noted, Return in about 2 months  05/15/2022 Samara Deist DPM (Podiatry)No medication Changes noted, follow-up PRN  04/30/2022 Dr.  Diamantina Providence MD (Urology)No medication Changes noted 04/27/2022 Dr. Delana Meyer MD (Vascular Surgery) No medication Changes noted  Hospital visits:  Medication Reconciliation was completed by comparing discharge summary, patient's EMR and Pharmacy list, and upon discussion with patient.   Admitted to the hospital on 08/30/203 due to Coronary artery disease. Discharge date was 05/07/2022. Discharged from [place]?Medications Started at [facility] Discharge:?? -started None ID  Medication Changes at Hospital Discharge: -Changed None ID  Medications Discontinued at Hospital Discharge: -Stopped None ID  Medications that remain the same after Hospital Discharge:??  -All other medications will remain the same.    Admitted to the hospital on 05/06/2022 due to Elevated troponin I level . Discharge date was 05/06/2022. Discharged from [facility] [place]?Medications Started at [facility] Discharge:?? -started None ID  Medication Changes at Hospital Discharge: -Changed None ID  Medications Discontinued at Hospital Discharge: -Stopped None ID  Medications that remain the same after Hospital Discharge:??  -All other medications will remain the same.    Admitted to the hospital on 04/21/2022 due to Hypertension. Discharge date was 04/21/2022. Discharged from [place]?Medications Started at [facility] Discharge:?? -started tamsulosin 0.4 mg daily -started Amlodipine 5 mg daily  Medication Changes at Hospital Discharge: -Changed None ID  Medications Discontinued at Hospital Discharge: -Stopped Maxzide  Medications that remain the same after Hospital Discharge:??  -All other medications will remain the same.     Star Rating Drugs:  Medication: Atorvastatin 40 mg Last Fill: 08/25/2022 90 Day Supply at [facility]. Medication: Losartan 50 mg Last Fill: 08/13/2022 60 Day Supply at [facility]. Medication:  Metformin 500  mg Last Fill: 09/29/2022 90 Day Supply at [facility].  Care Gaps: Annual wellness visit in last year? Yes, last completed 11/05/2021 Diabetic Kidney evaluation - Urine ACR Colonoscopy COVID-19 Vaccine  If Diabetic: Last eye exam / retinopathy screening:last completed 04/03/2022 Last diabetic foot exam:unable to fine   [place] Pharmacist Assistant [phone]

## 2022-10-20 ENCOUNTER — Ambulatory Visit: Payer: Medicare Other

## 2022-10-20 VITALS — BP 130/68

## 2022-10-20 DIAGNOSIS — E039 Hypothyroidism, unspecified: Secondary | ICD-10-CM

## 2022-10-20 DIAGNOSIS — I10 Essential (primary) hypertension: Secondary | ICD-10-CM

## 2022-10-20 DIAGNOSIS — E782 Mixed hyperlipidemia: Secondary | ICD-10-CM

## 2022-10-20 DIAGNOSIS — G4700 Insomnia, unspecified: Secondary | ICD-10-CM

## 2022-10-20 DIAGNOSIS — I6521 Occlusion and stenosis of right carotid artery: Secondary | ICD-10-CM

## 2022-10-20 DIAGNOSIS — E119 Type 2 diabetes mellitus without complications: Secondary | ICD-10-CM

## 2022-10-20 NOTE — Patient Instructions (Addendum)
Visit Information It was great speaking with you today!  Please let me know if you have any questions about our visit.  Plan:  Pick up some Arthritis Tylenol Strength 650 mg. Start taking one tablet nightly.  I will look into the price of using [facility] to help manage and organize [place] Medications   Print copy of patient instructions, educational materials, and care plan provided in person.  Telephone follow up appointment with pharmacy team member scheduled for: 11/17/2022 at 3:00 PM  Junius Argyle, PharmD, [place], [place] Pharmacist Practitioner  [facility] [phone]

## 2022-10-20 NOTE — Progress Notes (Unsigned)
Care Management & Coordination Services Pharmacy Note  10/20/2022 Name:  [facility] Nakia Golliher. MRN:  [id number] DOB:  [birth year]  Summary: Patient presents for initial pharmacy consult. Patient is accompanied today by his daughter [name].   -Patient reports numbness in right arm that often happens overnight. States he discussed this with Dr. Caryn Section and it is believed to likely be arthritis. Patient was prescribed Meloxicam for management, which did improve patient's symptoms. He uses Meloxicam sparingly now.  -Patient interested in [facility] if price is similar. Patient does not have Part D insurance.   Recommendations/Changes made from today's visit:  -Counseled patient to monitor blood pressure once weekly and record. -Counseled on bleeding risks of antiplatelet agents with NSAID use.   Follow up plan: CPP follow-up 1 month    Subjective: Dezmon Herschell [place] Reo. is an 87 y.o. year old male who is a primary patient of Fisher, Kirstie Peri, MD.  The care coordination team was consulted for assistance with disease management and care coordination needs.    Engaged with patient face to face for initial visit.  Recent office visits: 08/03/2022 Dr. Caryn Section MD (PCP) Start Hydrochlorothiazide 25 mg daily, Lab work completed - A1C- 6.5, return in 4 months 05/15/2022 Dr. Caryn Section MD (PCP) No medication changes noted, No Orders Placed 05/01/2022 Dr. Caryn Section MD (PCP)  Change trazodone to 1/2 tablet or whole tablet PRN 04/29/2022 Dr. Caryn Section MD (PCP) Start Trazodone 100 mg daily at bedtime 04/27/2022 Rory Percy DO (PCP Office)  Restart Amlodipine 5 mg daily 04/23/2022 Mikey Kirschner PA-C (PCP Office) No medication changes noted, Ambulatory referral to Sleep Studies , Return in about 1 week  04/22/2022 Rory Percy DO (PCP Office) Stop amlodipine, and restart Losartan, No Orders Placed Follow up in 1 month  Recent consult visits: 08/18/2022 Dr. Truett Perna MD (Cardiology) No  Medication changes noted, return in 1 year 08/17/2022 Bethena Roys PA (Neurology) No Medication changes noted 08/14/2022 Dr. Posey Pronto MD  (Ortho Surgery)- Unable to see note 08/03/2022 Dr. Delana Meyer MD (Vascular Surgery) No medication Changes noted 07/15/2022 Dr. Manuella Ghazi MD (Neurology) Unable to see note 05/28/2022 Dr. Truett Perna MD (Cardiology)Increase losartan to 75 mg daily, Referral to Neurology  05/25/2022 Dr. Delana Meyer MD (Vascular Surgery) No medication Changes noted, Return in about 2 months  05/15/2022 Samara Deist DPM (Podiatry)No medication Changes noted, follow-up PRN  04/30/2022 Dr. Diamantina Providence MD (Urology)No medication Changes noted 04/27/2022 Dr. Delana Meyer MD (Vascular Surgery) No medication Changes noted  Hospital visits: Admitted to the hospital on 08/30/203 due to Coronary artery disease. Discharge date was 05/07/2022. Discharged from [facility].     Admitted to the hospital on 04/21/2022 due to Hypertension. Discharge date was 04/21/2022. Discharged from [facility].   Objective:  Lab Results  Component Value Date   CREATININE 0.95 08/03/2022   BUN 13 08/03/2022   EGFR 79 08/03/2022   GFRNONAA >60 05/06/2022   GFRAA >60 12/23/2019   NA 141 08/03/2022   K 3.6 08/03/2022   CALCIUM 10.1 08/03/2022   CO2 24 08/03/2022   GLUCOSE 114 (H) 08/03/2022    Lab Results  Component Value Date/Time   HGBA1C 6.5 (A) 08/03/2022 04:04 PM   HGBA1C 5.8 05/07/2022 12:00 AM   HGBA1C 6.5 (H) 04/29/2022 10:00 AM   HGBA1C 6.7 (H) 01/14/2022 10:52 AM   HGBA1C 5.6 11/09/2011 12:00 AM    Last diabetic Eye exam:  Lab Results  Component Value Date/Time   HMDIABEYEEXA No Retinopathy 04/03/2022 07:58 AM  Last diabetic Foot exam: No results found for: "HMDIABFOOTEX"   Lab Results  Component Value Date   CHOL 115 01/14/2022   HDL 45 01/14/2022   LDLCALC 37 01/14/2022   TRIG 212 (H) 01/14/2022   CHOLHDL 2.6 01/14/2022       Latest Ref Rng & Units 08/03/2022     4:29 PM 05/06/2022   12:08 PM 04/29/2022   10:00 AM  Hepatic Function  Total Protein 6.5 - 8.1 g/dL  7.8  7.3   Albumin 3.7 - 4.7 g/dL 4.5  4.2  4.6   AST 15 - 41 U/L  40  40   ALT 0 - 44 U/L  41  44   Alk Phosphatase 38 - 126 U/L  51  61   Total Bilirubin 0.3 - 1.2 mg/dL  1.1  1.2     Lab Results  Component Value Date/Time   TSH 3.410 04/29/2022 10:00 AM   TSH 8.600 (H) 01/14/2022 10:52 AM   FREET4 1.35 04/29/2022 10:00 AM   FREET4 1.11 01/14/2022 10:52 AM       Latest Ref Rng & Units 05/06/2022   12:08 PM 04/29/2022   10:00 AM 04/15/2022    4:36 AM  CBC  WBC 4.0 - 10.5 K/uL 7.4  8.6  12.5   Hemoglobin 13.0 - 17.0 g/dL 13.9  13.9  12.9   Hematocrit 39.0 - 52.0 % 40.5  41.4  37.5   Platelets 150 - 400 K/uL 340  339  238     No results found for: "VD25OH", "VITAMINB12"  Clinical ASCVD: Yes       04/22/2022    9:46 AM 11/05/2021    9:45 AM 01/07/2021    2:03 PM  Depression screen PHQ 2/9  Decreased Interest 0 0 0  Down, Depressed, Hopeless 1 0 0  PHQ - 2 Score 1 0 0  Altered sleeping 3  0  Tired, decreased energy 0  0  Change in appetite 3  0  Feeling bad or failure about yourself  0  0  Trouble concentrating 0  0  Moving slowly or fidgety/restless 0  0  Suicidal thoughts 0  0  PHQ-9 Score 7  0  Difficult doing work/chores Not difficult at all  Not difficult at all     Social History   Tobacco Use  Smoking Status Never   Passive exposure: Never  Smokeless Tobacco Never   BP Readings from Last 3 Encounters:  08/03/22 132/66  08/03/22 137/77  05/25/22 (!) 164/70   Pulse Readings from Last 3 Encounters:  08/03/22 74  08/03/22 78  05/25/22 82   Wt Readings from Last 3 Encounters:  08/03/22 189 lb 6.4 oz (85.9 kg)  08/03/22 189 lb (85.7 kg)  05/25/22 190 lb 3.2 oz (86.3 kg)   BMI Readings from Last 3 Encounters:  08/03/22 27.97 kg/m  08/03/22 27.91 kg/m  05/25/22 28.09 kg/m    No Known Allergies  Medications Reviewed Today     Reviewed by  Katha Cabal, MD (Physician) on 08/04/22 at 3  Med List Status: <None>   Medication Order Taking? Sig Documenting Provider Last Dose Status Informant  amLODipine (NORVASC) 5 MG tablet [id number] Yes Take 1 tablet (5 mg total) by mouth daily. Birdie Sons, MD Taking Active   aspirin EC 81 MG tablet [id number] Yes Take 81 mg by mouth daily. [provider] Taking Active   atorvastatin (LIPITOR) 40 MG tablet [id number] Yes Take 1 tablet by mouth  once daily Birdie Sons, MD Taking Active   Carboxymethylcellulose Sodium (ARTIFICIAL TEARS OP) [id number] Yes Apply to eye. [provider] Taking Active   Cholecalciferol (VITAMIN D3) 20 MCG TABS [id number] Yes Take 1 tablet by mouth daily.  [provider] Taking Active   clopidogrel (PLAVIX) 75 MG tablet [id number] Yes Take 1 tablet (75 mg total) by mouth daily. Schnier, Dolores Lory, MD Taking Active   Cyanocobalamin (VITAMIN B12) 1000 MCG TBCR [id number] Yes Take 1 tablet by mouth daily. [provider] Taking Active   glucose blood (ONETOUCH VERIO) test strip [id number] Yes Test TID. Use as instructed Mikey Kirschner, PA-C Taking Active   Lancets [facility] ULTRASOFT) lancets [id number] Yes Test TID as instructed Mikey Kirschner, PA-C Taking Active   levothyroxine (SYNTHROID) 75 MCG tablet [id number] Yes TAKE 1 TABLET BY MOUTH ONCE DAILY BEFORE BREAKFAST Birdie Sons, MD Taking Active   losartan (COZAAR) 50 MG tablet [id number] Yes Take 1 tablet (50 mg total) by mouth daily. Myles Gip, DO Taking Active   melatonin 3 MG TABS tablet [id number] Yes Take 6 mg by mouth at bedtime. [provider] Taking Active   meloxicam (MOBIC) 7.5 MG tablet [id number] Yes Take 1 tablet (7.5 mg total) by mouth daily as needed for pain. Birdie Sons, MD Taking Active   metFORMIN (GLUCOPHAGE-XR) 500 MG 24 hr tablet [id number] Yes Take 1 tablet by mouth once daily with breakfast Birdie Sons, MD Taking Active    Multiple Vitamins-Minerals (MULTIVITAMIN ADULT PO) [id number] Yes Take 1 tablet by mouth daily.  [provider] Taking Active            Med Note Josepha Pigg Nov 20, 2015  3:57 PM)    nystatin cream (MYCOSTATIN) [id number] Yes Apply topically 2 (two) times daily. [provider] Taking Active   tamsulosin (FLOMAX) 0.4 MG CAPS capsule [id number] Yes Take 1 capsule (0.4 mg total) by mouth daily after supper. Billey Co, MD Taking Active   traZODone (DESYREL) 100 MG tablet [id number] Yes Take 0.5-1 tablets (50-100 mg total) by mouth at bedtime. Birdie Sons, MD Taking Active             SDOH:  (Social Determinants of Health) assessments and interventions performed: Yes SDOH Interventions    Flowsheet Row Clinical Support from 11/05/2021 in [facility] from 04/04/2020 in [facility]  SDOH Interventions    Food Insecurity Interventions Intervention Not Indicated --  Housing Interventions Intervention Not Indicated --  Transportation Interventions Intervention Not Indicated --  Financial Strain Interventions Intervention Not Indicated --  Physical Activity Interventions Intervention Not Indicated Other (Comments)  [Recommend to start walking 3 days a week for at least 30 minutes at a time.]  Stress Interventions Intervention Not Indicated --  Social Connections Interventions Intervention Not Indicated --       Medication Assistance: None required.  Patient affirms current coverage meets needs.  Medication Access: Within the past 30 days, how often has patient missed a dose of medication? None Is a pillbox or other method used to improve adherence? Yes  Factors that may affect medication adherence? no barriers identified Are meds synced by current pharmacy? No  Are meds delivered by current pharmacy? No  Does patient experience delays in picking up medications due to  transportation concerns? No   [facility] Reviewed: Is patient disadvantaged to use [facility]?:  Unclear Current Rx insurance plan:  None Name and location of Current pharmacy:  [facility] [address], [place] [address] Phone: [phone] Fax: [phone]  [facility] services reviewed with patient today?: Yes  Patient requests to transfer care to [facility]?: Yes   Verbal consent obtained for [facility] enhanced pharmacy services (medication synchronization, adherence packaging, delivery coordination). A medication sync plan was created to allow patient to get all medications delivered once every 30 to 90 days per patient preference. Patient understands they have freedom to choose pharmacy and clinical pharmacist will coordinate care between all prescribers and [facility].  Compliance/Adherence/Medication fill history: Care Gaps: UACR Colonoscopy  Covid  AWV   Star-Rating Drugs: Medication: Atorvastatin 40 mg Last Fill: 08/25/2022 90 Day Supply at [facility]. Medication: Losartan 50 mg Last Fill: 08/13/2022 60 Day Supply at [facility]. Medication: Metformin 500 mg Last Fill: 09/29/2022 90 Day Supply at [facility]   Assessment/Plan   Hypertension (BP goal <130/80) -Controlled -Current treatment: Amlodipine 5 mg daily  HCTZ 25 mg daily  Losartan 75 mg daily  -Medications previously tried: NA  -Current home readings: Not monitoring as frequently recently.  -Denies hypotensive/hypertensive symptoms, although recently noted mild pain/pressure around his right temple. Resolved quickly after taking OTC pain medication. -Counseled patient to monitor blood pressure once weekly and record. -Recommended to continue current medication  Hyperlipidemia: (LDL goal < 55) -Controlled -History of PAD, Stenosis of left carotid artery s/p stent Aug 2023  -History of possible  TIA  -Current treatment: Atorvastatin 40 mg daily  -Current treatment: Aspirin 81 mg daily  Clopidogrel 75 mg daily  -Medications previously tried: NA  -Uses meloxicam less than 1x weekly  -Per patient, initially he was told he would be on clopidogrel for ~3 months after his stent, but on follow-up his vascular specialist decided it would be better to continue DAPT indefinitely.   -Recommended to continue current medication  Diabetes (A1c goal <7%) -Controlled -Current medications Metformin XR 500 mg daily  -Medications previously tried: NA  -Current home glucose readings: not monitoring at home.  -Denies hypoglycemic/hyperglycemic symptoms -Recommended to continue current medication  BPH (Goal: Improve symptom relief) -Uncontrolled -Current treatment  Tamsulosin 0.4 mg daily  -Medications previously tried: NA -Patient reports mild improvement in nocturia since starting tamsulosin. Still wakes up multiple times (every 1-2 hours) throughout the night to urinate.   -Recommended to continue current medication  Arm pain/numbness (Goal: Improve symptoms) -Not ideally controlled -Current treatment  Meloxicam 7.5 mg daily as needed -Medications previously tried: NA  -Patient reports numbness in right arm that often happens overnight. States he discussed this with Dr. Caryn Section and it is believed to likely be arthritis. Patient was prescribed Meloxicam for management, which did improve patient's symptoms. -Discussed bleeding risks of antiplatelet agents with NSAID use. Patient has been taking Meloxicam infrequently now.   -START Acetaminophen CR 650 mg nightly before bed.   Junius Argyle, PharmD, Para [place], CPP  Clinical Pharmacist Practitioner  [facility] [phone]

## 2022-10-21 MED ORDER — LOSARTAN POTASSIUM 50 MG PO TABS: 50.0000 mg | ORAL_TABLET | Freq: Every day | ORAL | 1 refills | Status: DC

## 2022-10-21 MED ORDER — TRAZODONE HCL 100 MG PO TABS: 100.0000 mg | ORAL_TABLET | Freq: Every day | ORAL | 1 refills | Status: DC

## 2022-10-21 MED ORDER — AMLODIPINE BESYLATE 5 MG PO TABS: 5.0000 mg | ORAL_TABLET | Freq: Every day | ORAL | 1 refills | Status: DC

## 2022-10-21 MED ORDER — CLOPIDOGREL BISULFATE 75 MG PO TABS: 75.0000 mg | ORAL_TABLET | Freq: Every day | ORAL | 1 refills | Status: DC

## 2022-10-21 MED ORDER — LEVOTHYROXINE SODIUM 75 MCG PO TABS: 75.0000 ug | ORAL_TABLET | Freq: Every day | ORAL | 1 refills | Status: DC

## 2022-10-21 MED ORDER — HYDROCHLOROTHIAZIDE 25 MG PO TABS: 25.0000 mg | ORAL_TABLET | Freq: Every day | ORAL | 1 refills | Status: DC

## 2022-10-21 MED ORDER — METFORMIN HCL ER 500 MG PO TB24: 500.0000 mg | ORAL_TABLET | Freq: Every day | ORAL | 1 refills | Status: DC

## 2022-10-21 MED ORDER — ATORVASTATIN CALCIUM 40 MG PO TABS: 40.0000 mg | ORAL_TABLET | Freq: Every day | ORAL | 1 refills | Status: DC

## 2022-11-04 ENCOUNTER — Other Ambulatory Visit: Payer: Self-pay | Admitting: Family Medicine

## 2022-11-04 DIAGNOSIS — I10 Essential (primary) hypertension: Secondary | ICD-10-CM

## 2022-11-04 NOTE — Telephone Encounter (Signed)
Refilled 10/21/22.

## 2022-11-05 ENCOUNTER — Ambulatory Visit (INDEPENDENT_AMBULATORY_CARE_PROVIDER_SITE_OTHER): Payer: Medicare Other | Admitting: Urology

## 2022-11-05 ENCOUNTER — Encounter: Payer: Self-pay | Admitting: Urology

## 2022-11-05 ENCOUNTER — Other Ambulatory Visit: Payer: Self-pay | Admitting: Family Medicine

## 2022-11-05 VITALS — BP 142/73 | HR 77 | Ht 69.0 in | Wt 190.1 lb

## 2022-11-05 DIAGNOSIS — I10 Essential (primary) hypertension: Secondary | ICD-10-CM

## 2022-11-05 DIAGNOSIS — R339 Retention of urine, unspecified: Secondary | ICD-10-CM

## 2022-11-05 DIAGNOSIS — N401 Enlarged prostate with lower urinary tract symptoms: Secondary | ICD-10-CM | POA: Diagnosis not present

## 2022-11-05 DIAGNOSIS — R351 Nocturia: Secondary | ICD-10-CM | POA: Diagnosis not present

## 2022-11-05 LAB — BLADDER SCAN AMB NON-IMAGING

## 2022-11-05 MED ORDER — TAMSULOSIN HCL 0.4 MG PO CAPS: 0.4000 mg | ORAL_CAPSULE | Freq: Every day | ORAL | 11 refills | Status: DC

## 2022-11-05 MED ORDER — FINASTERIDE 5 MG PO TABS: 5.0000 mg | ORAL_TABLET | Freq: Every day | ORAL | 3 refills | Status: DC

## 2022-11-05 NOTE — Progress Notes (Signed)
   11/05/2022 2:20 PM   Jamareon Herschell [place] Bayles. [birth year] [id number]  Reason for visit: Follow up BPH, nocturia, history of retention  HPI: Comorbid 87 year old male here again with his daughter today for the above issues.  She helps provide most of the history.  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  He has done well on the Flomax, with no recurrent episodes of urinary retention, and PVRs have been normal, including 64m today.  PSA was normal at 1.7 in May 2023, stable over the last 5 years, no further PSA screening required per AUA guideline recommendations.  His primary issue is chronic nocturia 2-4 times overnight.  He has a number of risk factors including hypertension on multiple medications, lower extremity edema, and consumes a decent amount of fluids in the evening.  He also has trouble with sleep quality, and was recently started on trazodone through his PCP with significant improvement.  He also had a sleep study which was borderline, and he opted to defer CPAP.  We reviewed that nocturia is multifactorial, and we reviewed behavioral strategies extensively.  He really denies any significant bothersome urinary symptoms during the day aside from some frequency.  He a CT was recently performed on 05/06/2022 for vascular follow-up.  I personally viewed and interpreted those images that show a 120 g prostate, no hydronephrosis, no other bladder abnormalities.  We again discussed that nocturia is multifactorial, and focused on behavioral strategies.  We discussed alternatives like finasteride, or even consideration of an outlet procedure.  I discouraged him from considering outlet procedure, as he really does well during the day and primary issue is nocturia that is likely multifactorial.  They were interested in a trial of finasteride and risk and benefits were discussed.  Flomax refilled,  addition of finasteride 5 mg daily RTC 6 months PVR and symptom check, if doing well spaced to 1 year follow-up   [place] [place] [phone]

## 2022-11-05 NOTE — Patient Instructions (Signed)

## 2022-11-05 NOTE — Telephone Encounter (Signed)
Patients daughter called checking status of refill of hydrochlorathzie, '25mg'$  he is all out of this med I gave 48-72hr turn around

## 2022-11-06 ENCOUNTER — Telehealth (INDEPENDENT_AMBULATORY_CARE_PROVIDER_SITE_OTHER): Payer: Self-pay

## 2022-11-06 ENCOUNTER — Telehealth: Payer: Self-pay

## 2022-11-06 DIAGNOSIS — H2513 Age-related nuclear cataract, bilateral: Secondary | ICD-10-CM | POA: Diagnosis not present

## 2022-11-06 DIAGNOSIS — E119 Type 2 diabetes mellitus without complications: Secondary | ICD-10-CM | POA: Diagnosis not present

## 2022-11-06 NOTE — Telephone Encounter (Signed)
Refilled 10/21/22 #30 with 1 refill.

## 2022-11-06 NOTE — Progress Notes (Signed)
Returning patient daughter call as requested from Clinical pharmacist.  Patient daughter states she requested [facility] because he is out, and [facility] does not have this at this moment. Patient daughter states she prefers not to have his medication held up. Patient daughter reports they thought the clinical pharmacist was going to fine out about pricing, and discuss this with them at the patient next appointment before they agree to transfer the patient medications to [place] daughter states they did not give the "go ahead and transfer yet".   Patient daughter states she does not think having the patient medications in packs is going to be good idea for him as it will be confusing because he has a lot of medication changes so she would like for him to  stay in vials.   Notified Clinical Pharmacist of above.   Per Clinical pharmacist, he is fine with staying in vials. Honestly it was a mistake on the pharmacy's part. I sent them prescriptions to check the price but we never did an onboarding form so I didn't realize they were even filling anything for him.   Patient daughter verbalized understanding, and states she would like to discuss medication prices first before they transfer his medications.Patient has a telephone visit on 11/17/2022 at 3:00 pm with the Clinical pharmacist.  [place] Pharmacist Assistant [phone]

## 2022-11-06 NOTE — Telephone Encounter (Signed)
Ernest James called for advice for her father whom fell today and on his knee today and landed forward and hit his head today on carpet. He was able to get up after a few mins. The daughter stated that he is walking fine and the redness is gone, but she is worried because Schnier had discussed some things with them about falls and taking blood thinners. The patient is currently on Plavix and 81 mg aspirin.  She also stated that Schnier knows about TIA symptoms the patients had in August, but has had any symptoms since December. Patient just wants advice on what to do.

## 2022-11-06 NOTE — Telephone Encounter (Signed)
If he fell on carpet he is likely fine but she should watch for any mental status changes or level of consciousness changes as well as any significant notable bruising.  If any of these are cause for concern over the weekend she should present to the emergency department

## 2022-11-06 NOTE — Telephone Encounter (Signed)
Called Patient and left a detailed voicemail.

## 2022-11-07 ENCOUNTER — Other Ambulatory Visit (INDEPENDENT_AMBULATORY_CARE_PROVIDER_SITE_OTHER): Payer: Self-pay | Admitting: Vascular Surgery

## 2022-11-07 DIAGNOSIS — I6521 Occlusion and stenosis of right carotid artery: Secondary | ICD-10-CM

## 2022-11-09 ENCOUNTER — Telehealth (INDEPENDENT_AMBULATORY_CARE_PROVIDER_SITE_OTHER): Payer: Self-pay

## 2022-11-09 NOTE — Telephone Encounter (Signed)
I spoke with patient daughter and they prefer our office continuing the medication refills. She did inform that her father has not receive refill from 2 weeks ago that was filled by different provider.

## 2022-11-09 NOTE — Telephone Encounter (Signed)
Ernest James called to follow up stating her dad may not have hit his head as hard as they thought he did . Ernest James didn't have any mental changes or bruising over the weekend.

## 2022-11-11 ENCOUNTER — Ambulatory Visit (INDEPENDENT_AMBULATORY_CARE_PROVIDER_SITE_OTHER): Payer: Medicare Other

## 2022-11-11 VITALS — BP 138/72 | Ht 69.5 in | Wt 190.1 lb

## 2022-11-11 DIAGNOSIS — Z7189 Other specified counseling: Secondary | ICD-10-CM

## 2022-11-11 DIAGNOSIS — H9193 Unspecified hearing loss, bilateral: Secondary | ICD-10-CM

## 2022-11-11 DIAGNOSIS — Z0000 Encounter for general adult medical examination without abnormal findings: Secondary | ICD-10-CM

## 2022-11-11 NOTE — Progress Notes (Signed)
Subjective:   Ernest James. is a 87 y.o. male who presents for Medicare Annual/Subsequent preventive examination with his daughter, [name].  Review of Systems     Cardiac Risk Factors include: advanced age (>9mn, >>36women);diabetes mellitus;hypertension;male gender;dyslipidemia;sedentary lifestyle     Objective:    Today's Vitals   11/11/22 1513  Weight: 190 lb 1.6 oz (86.2 kg)  Height: 5' 9.5" (1.765 m)   Body mass index is 27.67 kg/m.     11/11/2022    3:48 PM 05/06/2022   11:37 AM 04/21/2022    7:07 AM 04/15/2022   10:00 AM 01/09/2022   10:54 AM 11/05/2021    9:46 AM 08/06/2021    9:36 AM  Advanced Directives  Does Patient Have a Medical Advance Directive? Yes Yes No No Yes No No  Type of Advance Directive  Living will   Living will    Does patient want to make changes to medical advance directive?     No - Patient declined    Would patient like information on creating a medical advance directive?   No - Patient declined No - Patient declined No - Patient declined No - Patient declined     Current Medications (verified) Outpatient Encounter Medications as of 11/11/2022  Medication Sig   acetaminophen (TYLENOL) 500 MG tablet Take 500 mg by mouth as needed for mild pain.   acetaminophen (TYLENOL) 650 MG CR tablet Take 650 mg by mouth as needed for pain.   amLODipine (NORVASC) 5 MG tablet Take 1 tablet (5 mg total) by mouth daily.   aspirin EC 81 MG tablet Take 81 mg by mouth daily.   atorvastatin (LIPITOR) 40 MG tablet Take 1 tablet (40 mg total) by mouth daily.   Cholecalciferol (VITAMIN D3) 20 MCG TABS Take 1 tablet by mouth daily.    clopidogrel (PLAVIX) 75 MG tablet Take 1 tablet by mouth once daily   Cyanocobalamin (VITAMIN B12) 1000 MCG TBCR Take 1 tablet by mouth daily.   finasteride (PROSCAR) 5 MG tablet Take 1 tablet (5 mg total) by mouth daily.   hydrochlorothiazide (HYDRODIURIL) 25 MG tablet Take 1 tablet (25 mg total) by mouth daily.   ibuprofen  (ADVIL) 200 MG tablet Take 200 mg by mouth as needed.   levothyroxine (SYNTHROID) 75 MCG tablet Take 1 tablet (75 mcg total) by mouth daily before breakfast.   losartan (COZAAR) 50 MG tablet Take 1 tablet (50 mg total) by mouth daily.   metFORMIN (GLUCOPHAGE-XR) 500 MG 24 hr tablet Take 1 tablet (500 mg total) by mouth daily with breakfast.   polyethylene glycol powder (GLYCOLAX/MIRALAX) 17 GM/SCOOP powder Take 17 g by mouth daily as needed for mild constipation.   tamsulosin (FLOMAX) 0.4 MG CAPS capsule Take 1 capsule (0.4 mg total) by mouth daily after supper.   traZODone (DESYREL) 100 MG tablet Take 1 tablet (100 mg total) by mouth at bedtime.   Vitamin D, Ergocalciferol, (DRISDOL) 1.25 MG (50000 UNIT) CAPS capsule Take 50,000 Units by mouth every 7 (seven) days.   meloxicam (MOBIC) 7.5 MG tablet Take 1 tablet (7.5 mg total) by mouth daily as needed for pain. (Patient not taking: Reported on 11/11/2022)   nystatin cream (MYCOSTATIN) Apply topically 2 (two) times daily. (Patient not taking: Reported on 11/11/2022)   No facility-administered encounter medications on file as of 11/11/2022.    Allergies (verified) Patient has no known allergies.   History: Past Medical History:  Diagnosis Date   Arthritis  CAD (coronary artery disease)    Carotid artery stenosis    Celiac artery stenosis (HCC)    GERD (gastroesophageal reflux disease)    Hypertension    Phimosis    Pre-diabetes    Superior mesenteric artery stenosis [facility])    Past Surgical History:  Procedure Laterality Date   CAROTID PTA/STENT INTERVENTION Left 04/14/2022   Procedure: CAROTID PTA/STENT INTERVENTION;  Surgeon: Katha Cabal, MD;  Location: [facility];  Service: Cardiovascular;  Laterality: Left;   CIRCUMCISION     COLONOSCOPY WITH PROPOFOL N/A 03/31/2016   Procedure: COLONOSCOPY WITH PROPOFOL;  Surgeon: Lollie Sails, MD;  Location: [facility] ENDOSCOPY;  Service: Endoscopy;  Laterality: N/A;   MRI     SINUS  SURGERY  1994   on left side   TONSILLECTOMY     Family History  Problem Relation Age of Onset   Alzheimer's disease Mother    CAD Father    Diabetes Sister        type 2   Social History   Socioeconomic History   Marital status: Married    Spouse name: Not on file   Number of children: 3   Years of education: Not on file   Highest education level: Bachelor's degree (e.g., BA, AB, BS)  Occupational History   Occupation: Architectural technologist    Comment: retired  Tobacco Use   Smoking status: Never    Passive exposure: Never   Smokeless tobacco: Never  Vaping Use   Vaping Use: Never used  Substance and Sexual Activity   Alcohol use: No    Alcohol/week: 0.0 standard drinks of alcohol   Drug use: No   Sexual activity: Yes    Birth control/protection: None  Other Topics Concern   Not on file  Social History Narrative   Not on file   Social Determinants of Health   Financial Resource Strain: Low Risk  (11/11/2022)   Overall Financial Resource Strain (CARDIA)    Difficulty of Paying Living Expenses: Not hard at all  Food Insecurity: No Food Insecurity (11/11/2022)   Hunger Vital Sign    Worried About Running Out of Food in the Last Year: Never true    Ran Out of Food in the Last Year: Never true  Transportation Needs: No Transportation Needs (11/11/2022)   PRAPARE - Hydrologist (Medical): No    Lack of Transportation (Non-Medical): No  Physical Activity: Insufficiently Active (11/11/2022)   Exercise Vital Sign    Days of Exercise per Week: 2 days    Minutes of Exercise per Session: 20 min  Stress: No Stress Concern Present (11/11/2022)   [place]    Feeling of Stress : Not at all  Social Connections: Moderately Integrated (11/11/2022)   Social Connection and Isolation Panel [NHANES]    Frequency of Communication with Friends and Family: More than three times a week    Frequency of Social  Gatherings with Friends and Family: More than three times a week    Attends Religious Services: More than 4 times per year    Active Member of [employer] or Organizations: No    Attends Archivist Meetings: Never    Marital Status: Married    Tobacco Counseling Counseling given: Not Answered   Clinical Intake:  Pre-visit preparation completed: Yes  Pain : No/denies pain     BMI - recorded: 27.67 Nutritional Risks: None Diabetes: Yes CBG done?: No Did  pt. bring in CBG monitor from home?: No  How often do you need to have someone help you when you read instructions, pamphlets, or other written materials from your doctor or pharmacy?: 1 - Never  Diabetic?yes  Interpreter Needed?: No  Information entered by :: B.Elishah Ashmore,LPN   Activities of Daily Living    11/11/2022    3:48 PM 04/22/2022    9:46 AM  In your present state of health, do you have any difficulty performing the following activities:  Hearing? 1 1  Vision? 1 1  Difficulty concentrating or making decisions? 0 0  Comment memory not great   Walking or climbing stairs? 1 0  Dressing or bathing? 0 0  Doing errands, shopping? 0 0  Preparing Food and eating ? N   Using the Toilet? N   In the past six months, have you accidently leaked urine? N   Do you have problems with loss of bowel control? N   Managing your Medications? N   Managing your Finances? N   Housekeeping or managing your Housekeeping? N     Patient Care Team: Birdie Sons, MD as PCP - General (Family Medicine) Samara Deist, DPM as Referring Physician (Podiatry) Schnier, Dolores Lory, MD (Vascular Surgery) Reche Dixon, PA-C (Orthopedic Surgery) Birder Robson, MD as Referring Physician (Ophthalmology) Jonathon Bellows, MD as Consulting Physician (Gastroenterology) Germaine Pomfret, [facility] as Pharmacist (Pharmacist)  Indicate any recent Medical Services you may have received from other than [facility] providers in the past year (date may be  approximate).     Assessment:   This is a routine wellness examination for [place].  Hearing/Vision screen Hearing Screening - Comments:: Adequate hearing Vision Screening - Comments:: Needs cataract surgery Wears glasses cannot drive right now [facility] Georgette Dover  Dietary issues and exercise activities discussed: Current Exercise Habits: The patient does not participate in regular exercise at present, Exercise limited by: cardiac condition(s);neurologic condition(s)   Goals Addressed             This Visit's Progress    DIET - EAT MORE FRUITS AND VEGETABLES   Not on track    Exercise 3x per week (30 min per time)   Not on track    Recommend to exercise for 3 days a week for at least 30 minutes at a time.         Depression Screen    11/11/2022    3:34 PM 04/22/2022    9:46 AM 11/05/2021    9:45 AM 01/07/2021    2:03 PM 07/02/2020   11:29 AM 04/04/2020   11:12 AM 04/04/2019   11:27 AM  PHQ 2/9 Scores  PHQ - 2 Score 0 1 0 0 0 0 0  PHQ- 9 Score  7  0 0      Fall Risk    11/11/2022    3:18 PM 04/22/2022    9:46 AM 11/05/2021    9:47 AM 01/07/2021    2:04 PM 07/02/2020   11:29 AM  Fall Risk   Falls in the past year? 1 0 0 0 1  Number falls in past yr: 1 0 0 0 0  Injury with Fall? 0 0 0 0 0  Risk for fall due to : History of fall(s) No Fall Risks No Fall Risks    Follow up Education provided;Falls prevention discussed Falls evaluation completed Falls evaluation completed Falls evaluation completed Falls evaluation completed    FALL RISK PREVENTION PERTAINING TO THE HOME:  Any  stairs in or around the home? Yes  If so, are there any without handrails? Yes  Home free of loose throw rugs in walkways, pet beds, electrical cords, etc? Yes  Adequate lighting in your home to reduce risk of falls? Yes   ASSISTIVE DEVICES UTILIZED TO PREVENT FALLS:  Life alert? No  Use of a cane, walker or w/c? No cane as needed Grab bars in the bathroom? Yes  Shower chair or bench in  shower? Yes wife uses  Elevated toilet seat or a handicapped toilet? Yes  wife uses.ibuprofen dont need  TIMED UP AND GO:  Was the test performed? Yes .  Length of time to ambulate 10 feet: 13 sec.   Gait slow and steady without use of assistive device  Cognitive Function:        11/11/2022    3:49 PM 04/04/2019   11:33 AM 03/31/2018    8:43 AM  6CIT Screen  What Year? 0 points 0 points 0 points  What month? 0 points 0 points 0 points  What time? 0 points 0 points 0 points  Count back from 20 0 points 0 points 0 points  Months in reverse 0 points 0 points 0 points  Repeat phrase 0 points 0 points 2 points  Total Score 0 points 0 points 2 points    Immunizations Immunization History  Administered Date(s) Administered   Fluad Quad(high Dose 65+) 05/30/2019, 07/02/2020, 06/12/2021   Influenza Split 07/24/2011   Influenza, High Dose Seasonal PF 06/27/2015, 05/27/2016, 06/19/2017, 07/05/2018   Influenza,inj,Quad PF,6+ Mos 07/08/2014   PFIZER Comirnaty(Gray Top)Covid-19 Tri-Sucrose Vaccine 09/23/2019, 10/17/2019, 01/07/2021   Pneumococcal Conjugate-13 06/27/2015   Pneumococcal Polysaccharide-23 06/30/2007   Td 06/07/2001   Tdap 07/08/2014   Zoster Recombinat (Shingrix) 11/01/2020, 02/06/2021   Zoster, Live 02/16/2012    TDAP status: Up to date  Flu Vaccine status: Up to date  Pneumococcal vaccine status: Up to date  Covid-19 vaccine status: Completed vaccines  Qualifies for Shingles Vaccine? Yes   Zostavax completed Yes   Shingrix Completed?: Yes  Screening Tests Health Maintenance  Topic Date Due   Diabetic kidney evaluation - Urine ACR  Never done   COLONOSCOPY (Pts 45-46yr Insurance coverage will need to be confirmed)  03/31/2021   COVID-19 Vaccine (4 - 2023-24 season) 05/08/2022   INFLUENZA VACCINE  05/07/2023 (Originally 04/07/2022)   HEMOGLOBIN A1C  02/01/2023   OPHTHALMOLOGY EXAM  04/04/2023   Diabetic kidney evaluation - eGFR measurement  08/04/2023    Medicare Annual Wellness (AWV)  11/11/2023   DTaP/Tdap/Td (3 - Td or Tdap) 07/08/2024   Pneumonia Vaccine 85 Years old  Completed   Zoster Vaccines- Shingrix  Completed   HPV VACCINES  Aged Out    Health Maintenance  Health Maintenance Due  Topic Date Due   Diabetic kidney evaluation - Urine ACR  Never done   COLONOSCOPY (Pts 45-487yrInsurance coverage will need to be confirmed)  03/31/2021   COVID-19 Vaccine (4 - 2023-24 season) 05/08/2022    Colorectal cancer screening: No longer required.   Lung Cancer Screening: (Low Dose CT Chest recommended if Age 85-80ears, 30 pack-year currently smoking OR have quit w/in 15years.) does not qualify.   Lung Cancer Screening Referral: no  Additional Screening:  Hepatitis C Screening: does not qualify; Completed no  Vision Screening: Recommended annual ophthalmology exams for early detection of glaucoma and other disorders of the eye. Is the patient up to date with their annual eye exam?  Yes  Who  is the provider or what is the name of the office in which the patient attends annual eye exams? [facility] If pt is not established with a provider, would they like to be referred to a provider to establish care? No .   Dental Screening: Recommended annual dental exams for proper oral hygiene  Community Resource Referral / Chronic Care Management: CRR required this visit?  No   CCM required this visit?  No      Plan:     I have personally reviewed and noted the following in the patient's chart:   Medical and social history Use of alcohol, tobacco or illicit drugs  Current medications and supplements including opioid prescriptions. Patient is not currently taking opioid prescriptions. Functional ability and status Nutritional status Physical activity Advanced directives List of other physicians Hospitalizations, surgeries, and ER visits in previous 12 months Vitals Screenings to include cognitive, depression, and  falls Referrals and appointments  In addition, I have reviewed and discussed with patient certain preventive protocols, quality metrics, and best practice recommendations. A written personalized care plan for preventive services as well as general preventive health recommendations were provided to patient.     Roger Shelter, LPN   [id number]   Nurse Notes: pt visits today with daughter [name], who relays her concern of pt many falls over the last year and even 4-5 recently. Pt relays his balance is off at times and he does not pick up his feet as should at times. And really does not know why other times he falls. *Pt /daughter request for possibly PT to strength and improve gait. *Daughter also relays pt is having moments since 08/30/2022 that he cannot speak and what she describes as "maybe TIAs". She relays EMS was called, VS were stable and pt returned to normal self. She is consulting his cardiac and neuro doctors *Pt daughter also wants to know whether pt should receive another Covid booster and RSV vaccines.

## 2022-11-11 NOTE — Patient Instructions (Signed)
[name] , Thank you for taking time to come for your Medicare Wellness Visit. I appreciate your ongoing commitment to your health goals. Please review the following plan we discussed and let me know if I can assist you in the future.   These are the goals we discussed:  Goals      DIET - EAT MORE FRUITS AND VEGETABLES     Continue current diet plan of increasing fruits and vegetables in daily diet.      DIET - EAT MORE FRUITS AND VEGETABLES     Exercise 3x per week (30 min per time)     Recommend to exercise for 3 days a week for at least 30 minutes at a time.          This is a list of the screening recommended for you and due dates:  Health Maintenance  Topic Date Due   Yearly kidney health urinalysis for diabetes  Never done   Colon Cancer Screening  03/31/2021   COVID-19 Vaccine (4 - 2023-24 season) 05/08/2022   Flu Shot  05/07/2023*   Hemoglobin A1C  02/01/2023   Eye exam for diabetics  04/04/2023   Yearly kidney function blood test for diabetes  08/04/2023   Medicare Annual Wellness Visit  11/11/2023   DTaP/Tdap/Td vaccine (3 - Td or Tdap) 07/08/2024   Pneumonia Vaccine  Completed   Zoster (Shingles) Vaccine  Completed   HPV Vaccine  Aged Out  *Topic was postponed. The date shown is not the original due date.    Advanced directives: yes  Conditions/risks identified: high falls risk  Next appointment: Follow up in one year for your annual wellness visit. 11/16/2023 '@3'$ :15pm in person  Preventive Care 65 Years and Older, Male  Preventive care refers to lifestyle choices and visits with your health care provider that can promote health and wellness. What does preventive care include? A yearly physical exam. This is also called an annual well check. Dental exams once or twice a year. Routine eye exams. Ask your health care provider how often you should have your eyes checked. Personal lifestyle choices, including: Daily care of your teeth and gums. Regular  physical activity. Eating a healthy diet. Avoiding tobacco and drug use. Limiting alcohol use. Practicing safe sex. Taking low doses of aspirin every day. Taking vitamin and mineral supplements as recommended by your health care provider. What happens during an annual well check? The services and screenings done by your health care provider during your annual well check will depend on your age, overall health, lifestyle risk factors, and family history of disease. Counseling  Your health care provider may ask you questions about your: Alcohol use. Tobacco use. Drug use. Emotional well-being. Home and relationship well-being. Sexual activity. Eating habits. History of falls. Memory and ability to understand (cognition). Work and work Statistician. Screening  You may have the following tests or measurements: Height, weight, and BMI. Blood pressure. Lipid and cholesterol levels. These may be checked every 5 years, or more frequently if you are over 69 years old. Skin check. Lung cancer screening. You may have this screening every year starting at age 19 if you have a 30-pack-year history of smoking and currently smoke or have quit within the past 15 years. Fecal occult blood test (FOBT) of the stool. You may have this test every year starting at age 73. Flexible sigmoidoscopy or colonoscopy. You may have a sigmoidoscopy every 5 years or a colonoscopy every 10 years starting at age 70.  Prostate cancer screening. Recommendations will vary depending on your family history and other risks. Hepatitis C blood test. Hepatitis B blood test. Sexually transmitted disease (STD) testing. Diabetes screening. This is done by checking your blood sugar (glucose) after you have not eaten for a while (fasting). You may have this done every 1-3 years. Abdominal aortic aneurysm (AAA) screening. You may need this if you are a current or former smoker. Osteoporosis. You may be screened starting at age 6  if you are at high risk. Talk with your health care provider about your test results, treatment options, and if necessary, the need for more tests. Vaccines  Your health care provider may recommend certain vaccines, such as: Influenza vaccine. This is recommended every year. Tetanus, diphtheria, and acellular pertussis (Tdap, Td) vaccine. You may need a Td booster every 10 years. Zoster vaccine. You may need this after age 50. Pneumococcal 13-valent conjugate (PCV13) vaccine. One dose is recommended after age 16. Pneumococcal polysaccharide (PPSV23) vaccine. One dose is recommended after age 47. Talk to your health care provider about which screenings and vaccines you need and how often you need them. This information is not intended to replace advice given to you by your health care provider. Make sure you discuss any questions you have with your health care provider. Document Released: 09/20/2015 Document Revised: 05/13/2016 Document Reviewed: 06/25/2015 Elsevier Interactive Patient Education  2017 [place] Prevention in the Home Falls can cause injuries. They can happen to people of all ages. There are many things you can do to make your home safe and to help prevent falls. What can I do on the outside of my home? Regularly fix the edges of walkways and driveways and fix any cracks. Remove anything that might make you trip as you walk through a door, such as a raised step or threshold. Trim any bushes or trees on the path to your home. Use bright outdoor lighting. Clear any walking paths of anything that might make someone trip, such as rocks or tools. Regularly check to see if handrails are loose or broken. Make sure that both sides of any steps have handrails. Any raised decks and porches should have guardrails on the edges. Have any leaves, snow, or ice cleared regularly. Use sand or salt on walking paths during winter. Clean up any spills in your garage right away. This  includes oil or grease spills. What can I do in the bathroom? Use night lights. Install grab bars by the toilet and in the tub and shower. Do not use towel bars as grab bars. Use non-skid mats or decals in the tub or shower. If you need to sit down in the shower, use a plastic, non-slip stool. Keep the floor dry. Clean up any water that spills on the floor as soon as it happens. Remove soap buildup in the tub or shower regularly. Attach bath mats securely with double-sided non-slip rug tape. Do not have throw rugs and other things on the floor that can make you trip. What can I do in the bedroom? Use night lights. Make sure that you have a light by your bed that is easy to reach. Do not use any sheets or blankets that are too big for your bed. They should not hang down onto the floor. Have a firm chair that has side arms. You can use this for support while you get dressed. Do not have throw rugs and other things on the floor that can make you trip.  What can I do in the kitchen? Clean up any spills right away. Avoid walking on wet floors. Keep items that you use a lot in easy-to-reach places. If you need to reach something above you, use a strong step stool that has a grab bar. Keep electrical cords out of the way. Do not use floor polish or wax that makes floors slippery. If you must use wax, use non-skid floor wax. Do not have throw rugs and other things on the floor that can make you trip. What can I do with my stairs? Do not leave any items on the stairs. Make sure that there are handrails on both sides of the stairs and use them. Fix handrails that are broken or loose. Make sure that handrails are as long as the stairways. Check any carpeting to make sure that it is firmly attached to the stairs. Fix any carpet that is loose or worn. Avoid having throw rugs at the top or bottom of the stairs. If you do have throw rugs, attach them to the floor with carpet tape. Make sure that you  have a light switch at the top of the stairs and the bottom of the stairs. If you do not have them, ask someone to add them for you. What else can I do to help prevent falls? Wear shoes that: Do not have high heels. Have rubber bottoms. Are comfortable and fit you well. Are closed at the toe. Do not wear sandals. If you use a stepladder: Make sure that it is fully opened. Do not climb a closed stepladder. Make sure that both sides of the stepladder are locked into place. Ask someone to hold it for you, if possible. Clearly mark and make sure that you can see: Any grab bars or handrails. First and last steps. Where the edge of each step is. Use tools that help you move around (mobility aids) if they are needed. These include: Canes. Walkers. Scooters. Crutches. Turn on the lights when you go into a dark area. Replace any light bulbs as soon as they burn out. Set up your furniture so you have a clear path. Avoid moving your furniture around. If any of your floors are uneven, fix them. If there are any pets around you, be aware of where they are. Review your medicines with your doctor. Some medicines can make you feel dizzy. This can increase your chance of falling. Ask your doctor what other things that you can do to help prevent falls. This information is not intended to replace advice given to you by your health care provider. Make sure you discuss any questions you have with your health care provider. Document Released: 06/20/2009 Document Revised: 01/30/2016 Document Reviewed: 09/28/2014 Elsevier Interactive Patient Education  2017 [employer].

## 2022-11-14 ENCOUNTER — Other Ambulatory Visit: Payer: Self-pay | Admitting: Family Medicine

## 2022-11-14 DIAGNOSIS — E782 Mixed hyperlipidemia: Secondary | ICD-10-CM

## 2022-11-16 NOTE — Telephone Encounter (Signed)
Requested Prescriptions  Pending Prescriptions Disp Refills   atorvastatin (LIPITOR) 40 MG tablet [Pharmacy Med Name: Atorvastatin Calcium 40 MG Oral Tablet] 90 tablet 0    Sig: Take 1 tablet by mouth once daily     Cardiovascular:  Antilipid - Statins Failed - 11/14/2022  9:28 PM      Failed - Lipid Panel in normal range within the last 12 months    Cholesterol, Total  Date Value Ref Range Status  01/14/2022 115 100 - 199 mg/dL Final   LDL Chol Calc (NIH)  Date Value Ref Range Status  01/14/2022 37 0 - 99 mg/dL Final   HDL  Date Value Ref Range Status  01/14/2022 45 >39 mg/dL Final   Triglycerides  Date Value Ref Range Status  01/14/2022 212 (H) 0 - 149 mg/dL Final         Passed - Patient is not pregnant      Passed - Valid encounter within last 12 months    Recent Outpatient Visits           3 months ago Diabetes mellitus without complication ([place])   [place], MD   6 months ago Transient neurologic deficit   [place], Donald E, MD   6 months ago Renal artery stenosis [facility])   [place], Donald E, MD   6 months ago Insomnia, unspecified type   [facility] Birdie Sons, MD   6 months ago Primary hypertension   [place], Jake Church, DO       Future Appointments             In 2 weeks Fisher, Kirstie Peri, MD [facility]   In 5 months Diamantina Providence, Herbert Seta, MD [place]

## 2022-11-17 ENCOUNTER — Ambulatory Visit: Payer: Medicare Other

## 2022-11-17 DIAGNOSIS — E119 Type 2 diabetes mellitus without complications: Secondary | ICD-10-CM

## 2022-11-17 DIAGNOSIS — I10 Essential (primary) hypertension: Secondary | ICD-10-CM

## 2022-11-17 NOTE — Progress Notes (Signed)
Care Management & Coordination Services Pharmacy Note  11/17/2022 Name:  [facility] Ernest James. MRN:  [id number] DOB:  09-20-1935  Summary: Patient presents for follow-up pharmacy consult. Patient is accompanied today by his daughter [name].   Recommendations/Changes made from today's visit:  Continue current medications   Follow up plan: CPP follow-up 3 months   Subjective: Ernest James. is an 87 y.o. year old male who is a primary patient of Fisher, Kirstie Peri, MD.  The care coordination team was consulted for assistance with disease management and care coordination needs.    Engaged with patient face to face for initial visit.  Recent office visits: 08/03/2022 Dr. Caryn Section MD (PCP) Start Hydrochlorothiazide 25 mg daily, Lab work completed - A1C- 6.5, return in 4 months 05/15/2022 Dr. Caryn Section MD (PCP) No medication changes noted, No Orders Placed 05/01/2022 Dr. Caryn Section MD (PCP)  Change trazodone to 1/2 tablet or whole tablet PRN 04/29/2022 Dr. Caryn Section MD (PCP) Start Trazodone 100 mg daily at bedtime 04/27/2022 Rory Percy DO (PCP Office)  Restart Amlodipine 5 mg daily 04/23/2022 Mikey Kirschner PA-C (PCP Office) No medication changes noted, Ambulatory referral to Sleep Studies , Return in about 1 week  04/22/2022 Rory Percy DO (PCP Office) Stop amlodipine, and restart Losartan, No Orders Placed Follow up in 1 month  Recent consult visits:  11/05/22: Patient presented to Dr. Diamantina Providence (Urology). Finasteride 5 mg daily.  08/18/2022 Dr. Truett Perna MD (Cardiology) No Medication changes noted, return in 1 year 08/17/2022 Bethena Roys PA (Neurology) No Medication changes noted 08/14/2022 Dr. Posey Pronto MD  (Ortho Surgery)- Unable to see note 08/03/2022 Dr. Delana Meyer MD (Vascular Surgery) No medication Changes noted 07/15/2022 Dr. Manuella Ghazi MD (Neurology) Unable to see note 05/28/2022 Dr. Truett Perna MD (Cardiology)Increase losartan to 75 mg daily, Referral to Neurology  05/25/2022 Dr.  Delana Meyer MD (Vascular Surgery) No medication Changes noted, Return in about 2 months  05/15/2022 Samara Deist DPM (Podiatry)No medication Changes noted, follow-up PRN  04/30/2022 Dr. Diamantina Providence MD (Urology)No medication Changes noted 04/27/2022 Dr. Delana Meyer MD (Vascular Surgery) No medication Changes noted  Hospital visits: Admitted to the hospital on 08/30/203 due to Coronary artery disease. Discharge date was 05/07/2022. Discharged from [facility].     Admitted to the hospital on 04/21/2022 due to Hypertension. Discharge date was 04/21/2022. Discharged from [facility].   Objective:  Lab Results  Component Value Date   CREATININE 0.95 08/03/2022   BUN 13 08/03/2022   EGFR 79 08/03/2022   GFRNONAA >60 05/06/2022   GFRAA >60 12/23/2019   NA 141 08/03/2022   K 3.6 08/03/2022   CALCIUM 10.1 08/03/2022   CO2 24 08/03/2022   GLUCOSE 114 (H) 08/03/2022    Lab Results  Component Value Date/Time   HGBA1C 6.5 (A) 08/03/2022 04:04 PM   HGBA1C 5.8 05/07/2022 12:00 AM   HGBA1C 6.5 (H) 04/29/2022 10:00 AM   HGBA1C 6.7 (H) 01/14/2022 10:52 AM   HGBA1C 5.6 11/09/2011 12:00 AM    Last diabetic Eye exam:  Lab Results  Component Value Date/Time   HMDIABEYEEXA No Retinopathy 04/03/2022 07:58 AM    Last diabetic Foot exam: No results found for: "HMDIABFOOTEX"   Lab Results  Component Value Date   CHOL 115 01/14/2022   HDL 45 01/14/2022   LDLCALC 37 01/14/2022   TRIG 212 (H) 01/14/2022   CHOLHDL 2.6 01/14/2022       Latest Ref Rng & Units 08/03/2022    4:29 PM 05/06/2022   12:08 PM  04/29/2022   10:00 AM  Hepatic Function  Total Protein 6.5 - 8.1 g/dL  7.8  7.3   Albumin 3.7 - 4.7 g/dL 4.5  4.2  4.6   AST 15 - 41 U/L  40  40   ALT 0 - 44 U/L  41  44   Alk Phosphatase 38 - 126 U/L  51  61   Total Bilirubin 0.3 - 1.2 mg/dL  1.1  1.2     Lab Results  Component Value Date/Time   TSH 3.410 04/29/2022 10:00 AM   TSH 8.600 (H) 01/14/2022 10:52 AM    FREET4 1.35 04/29/2022 10:00 AM   FREET4 1.11 01/14/2022 10:52 AM       Latest Ref Rng & Units 05/06/2022   12:08 PM 04/29/2022   10:00 AM 04/15/2022    4:36 AM  CBC  WBC 4.0 - 10.5 K/uL 7.4  8.6  12.5   Hemoglobin 13.0 - 17.0 g/dL 13.9  13.9  12.9   Hematocrit 39.0 - 52.0 % 40.5  41.4  37.5   Platelets 150 - 400 K/uL 340  339  238     No results found for: "VD25OH", "VITAMINB12"  Clinical ASCVD: Yes       11/11/2022    3:34 PM 04/22/2022    9:46 AM 11/05/2021    9:45 AM  Depression screen PHQ 2/9  Decreased Interest 0 0 0  Down, Depressed, Hopeless 0 1 0  PHQ - 2 Score 0 1 0  Altered sleeping  3   Tired, decreased energy  0   Change in appetite  3   Feeling bad or failure about yourself   0   Trouble concentrating  0   Moving slowly or fidgety/restless  0   Suicidal thoughts  0   PHQ-9 Score  7   Difficult doing work/chores  Not difficult at all      Social History   Tobacco Use  Smoking Status Never   Passive exposure: Never  Smokeless Tobacco Never   BP Readings from Last 3 Encounters:  11/11/22 138/72  11/05/22 (!) 142/73  10/20/22 130/68   Pulse Readings from Last 3 Encounters:  11/05/22 77  08/03/22 74  08/03/22 78   Wt Readings from Last 3 Encounters:  11/11/22 190 lb 1.6 oz (86.2 kg)  11/05/22 190 lb 1.6 oz (86.2 kg)  08/03/22 189 lb 6.4 oz (85.9 kg)   BMI Readings from Last 3 Encounters:  11/11/22 27.67 kg/m  11/05/22 28.07 kg/m  08/03/22 27.97 kg/m    No Known Allergies  Medications Reviewed Today     Reviewed by Roger Shelter, LPN (Licensed Practical Nurse) on 11/11/22 at 1532  Med List Status: <None>   Medication Order Taking? Sig Documenting Provider Last Dose Status Informant  acetaminophen (TYLENOL) 500 MG tablet [id number] Yes Take 500 mg by mouth as needed for mild pain. [provider] Taking Active   acetaminophen (TYLENOL) 650 MG CR tablet [id number] Yes Take 650 mg by mouth as needed for pain. [provider] Taking Active            Med Note ([place]   Wed Nov 11, 2022  3:31 PM) Uses at night for pain  amLODipine (NORVASC) 5 MG tablet [id number] Yes Take 1 tablet (5 mg total) by mouth daily. Birdie Sons, MD Taking Active   aspirin EC 81 MG tablet [id number] Yes Take 81 mg by mouth daily. [provider] Taking Active  atorvastatin (LIPITOR) 40 MG tablet [id number] Yes Take 1 tablet (40 mg total) by mouth daily. Birdie Sons, MD Taking Active   Cholecalciferol (VITAMIN D3) 20 MCG TABS [id number] Yes Take 1 tablet by mouth daily.  [provider] Taking Active   clopidogrel (PLAVIX) 75 MG tablet [id number] Yes Take 1 tablet by mouth once daily Schnier, Dolores Lory, MD Taking Active   Cyanocobalamin (VITAMIN B12) 1000 MCG TBCR [id number] Yes Take 1 tablet by mouth daily. [provider] Taking Active   finasteride (PROSCAR) 5 MG tablet [id number] Yes Take 1 tablet (5 mg total) by mouth daily. Billey Co, MD Taking Active   hydrochlorothiazide (HYDRODIURIL) 25 MG tablet [id number] Yes Take 1 tablet (25 mg total) by mouth daily. Birdie Sons, MD Taking Active   ibuprofen (ADVIL) 200 MG tablet [id number] Yes Take 200 mg by mouth as needed. [provider] Taking Active   levothyroxine (SYNTHROID) 75 MCG tablet [id number] Yes Take 1 tablet (75 mcg total) by mouth daily before breakfast. Birdie Sons, MD Taking Active   losartan (COZAAR) 50 MG tablet [id number] Yes Take 1 tablet (50 mg total) by mouth daily. Birdie Sons, MD Taking Active            Med Note ([place]   Wed Nov 11, 2022  3:29 PM) Daughter sts cardiologist increased to 75mg   meloxicam (MOBIC) 7.5 MG tablet [id number] No Take 1 tablet (7.5 mg total) by mouth daily as needed for pain.  Patient not taking: Reported on 11/11/2022   Birdie Sons, MD Not Taking Active   metFORMIN (GLUCOPHAGE-XR) 500 MG 24 hr tablet [id number] Yes Take 1 tablet (500 mg total) by mouth daily with  breakfast. Birdie Sons, MD Taking Active   nystatin cream (MYCOSTATIN) [id number] No Apply topically 2 (two) times daily.  Patient not taking: Reported on 11/11/2022   [provider] Not Taking Active   polyethylene glycol powder (GLYCOLAX/MIRALAX) 17 GM/SCOOP powder [id number] Yes Take 17 g by mouth daily as needed for mild constipation. [provider] Taking Active   tamsulosin (FLOMAX) 0.4 MG CAPS capsule [id number] Yes Take 1 capsule (0.4 mg total) by mouth daily after supper. Billey Co, MD Taking Active   traZODone (DESYREL) 100 MG tablet [id number] Yes Take 1 tablet (100 mg total) by mouth at bedtime. Birdie Sons, MD Taking Active            Med Note ([place]   Wed Nov 11, 2022  3:29 PM) Pt takes 1/2 tablet as needed  Vitamin D, Ergocalciferol, (DRISDOL) 1.25 MG (50000 UNIT) CAPS capsule [id number] Yes Take 50,000 Units by mouth every 7 (seven) days. [provider] Taking Active             SDOH:  (Social Determinants of Health) assessments and interventions performed: Yes SDOH Interventions    Flowsheet Row Clinical Support from 11/11/2022 in [place] Coordination from 10/20/2022 in [place] from 11/05/2021 in [place] from 04/04/2020 in [place]  SDOH Interventions      Food Insecurity Interventions Intervention Not Indicated -- Intervention Not Indicated --  Housing Interventions Intervention Not Indicated -- Intervention Not Indicated --  Transportation Interventions Intervention Not Indicated Intervention Not Indicated Intervention Not Indicated --  Utilities Interventions Intervention Not Indicated -- -- --  Financial Strain Interventions Intervention Not Indicated Intervention Not Indicated Intervention Not  Indicated --  Physical Activity Interventions Intervention Not Indicated --  Intervention Not Indicated Other (Comments)  [Recommend to start walking 3 days a week for at least 30 minutes at a time.]  Stress Interventions Intervention Not Indicated -- Intervention Not Indicated --  Social Connections Interventions Intervention Not Indicated -- Intervention Not Indicated --       Medication Assistance: None required.  Patient affirms current coverage meets needs.  Medication Access: Within the past 30 days, how often has patient missed a dose of medication? None Is a pillbox or other method used to improve adherence? Yes  Factors that may affect medication adherence? no barriers identified Are meds synced by current pharmacy? No  Are meds delivered by current pharmacy? No  Does patient experience delays in picking up medications due to transportation concerns? No   [facility] Reviewed: Is patient disadvantaged to use [facility]?:  Unclear Current Rx insurance plan: None Name and location of Current pharmacy:  [facility] [address], [place] - [place] [address] [place] [address] Phone: [phone] Fax: [phone]  [facility] - [place], [place] - [address] [place] [address] Phone: [phone] Fax: [phone]  [facility] services reviewed with patient today?: Yes  Patient requests to transfer care to [facility]?: Yes   Verbal consent obtained for [facility] enhanced pharmacy services (medication synchronization, adherence packaging, delivery coordination). A medication sync plan was created to allow patient to get all medications delivered once every 30 to 90 days per patient preference. Patient understands they have freedom to choose pharmacy and clinical pharmacist will coordinate care between all prescribers and [facility].  Compliance/Adherence/Medication fill history: Care Gaps: UACR Colonoscopy  Covid  AWV    Star-Rating Drugs: Medication: Atorvastatin 40 mg Last Fill: 08/25/2022 90 Day Supply at [facility]. Medication: Losartan 50 mg Last Fill: 08/13/2022 60 Day Supply at [facility]. Medication: Metformin 500 mg Last Fill: 09/29/2022 90 Day Supply at [facility].   Assessment/Plan   Hypertension (BP goal <130/80) -Controlled -Current treatment: Amlodipine 5 mg daily  HCTZ 25 mg daily  Losartan 75 mg daily  -Medications previously tried: NA  -Current home readings: Not monitoring as frequently recently.  -Denies hypotensive/hypertensive symptoms -Recommended to continue current medication  Hyperlipidemia: (LDL goal < 55) -Controlled -History of PAD, Stenosis of left carotid artery s/p stent Aug 2023  -History of possible TIA  -Current treatment: Atorvastatin 40 mg daily  -Current treatment: Aspirin 81 mg daily  Clopidogrel 75 mg daily  -Medications previously tried: NA  -Uses meloxicam less than 1x weekly  -Recommended to continue current medication  Diabetes (A1c goal <7%) -Controlled -Current medications Metformin XR 500 mg daily  -Medications previously tried: NA  -Current home glucose readings: not monitoring at home.  -Denies hypoglycemic/hyperglycemic symptoms -Recommended to continue current medication  BPH (Goal: Improve symptom relief) -Uncontrolled -Current treatment  Tamsulosin 0.4 mg daily  -Medications previously tried: NA -Patient reports mild improvement in nocturia since starting tamsulosin. Still wakes up multiple times (every 1-2 hours) throughout the night to urinate.   -Recommended to continue current medication  Arm pain/numbness (Goal: Improve symptoms) -Not ideally controlled -Current treatment  Acetaminophen CR 650 mg  Meloxicam 7.5 mg daily as needed -Medications previously tried: NA  -Patient reports numbness in right arm that often happens overnight. States he discussed this with Dr. Caryn Section and it is believed to likely  be arthritis. Patient was  prescribed Meloxicam for management, which did improve patient's symptoms.   Junius Argyle, PharmD, [place], CPP  Clinical Pharmacist Practitioner  [facility] [phone]

## 2022-11-18 DIAGNOSIS — E569 Vitamin deficiency, unspecified: Secondary | ICD-10-CM | POA: Diagnosis not present

## 2022-11-18 DIAGNOSIS — Y92009 Unspecified place in unspecified non-institutional (private) residence as the place of occurrence of the external cause: Secondary | ICD-10-CM | POA: Diagnosis not present

## 2022-11-18 DIAGNOSIS — R202 Paresthesia of skin: Secondary | ICD-10-CM | POA: Diagnosis not present

## 2022-11-18 DIAGNOSIS — M25551 Pain in right hip: Secondary | ICD-10-CM | POA: Diagnosis not present

## 2022-11-18 DIAGNOSIS — R569 Unspecified convulsions: Secondary | ICD-10-CM | POA: Diagnosis not present

## 2022-11-18 DIAGNOSIS — M542 Cervicalgia: Secondary | ICD-10-CM | POA: Diagnosis not present

## 2022-11-18 DIAGNOSIS — W010XXA Fall on same level from slipping, tripping and stumbling without subsequent striking against object, initial encounter: Secondary | ICD-10-CM | POA: Diagnosis not present

## 2022-11-18 DIAGNOSIS — R413 Other amnesia: Secondary | ICD-10-CM | POA: Diagnosis not present

## 2022-11-18 DIAGNOSIS — Z8673 Personal history of transient ischemic attack (TIA), and cerebral infarction without residual deficits: Secondary | ICD-10-CM | POA: Diagnosis not present

## 2022-11-18 DIAGNOSIS — W19XXXA Unspecified fall, initial encounter: Secondary | ICD-10-CM | POA: Diagnosis not present

## 2022-11-19 DIAGNOSIS — M25551 Pain in right hip: Secondary | ICD-10-CM | POA: Diagnosis not present

## 2022-11-19 DIAGNOSIS — M542 Cervicalgia: Secondary | ICD-10-CM | POA: Diagnosis not present

## 2022-11-26 ENCOUNTER — Other Ambulatory Visit: Payer: Self-pay | Admitting: Student

## 2022-11-26 DIAGNOSIS — M542 Cervicalgia: Secondary | ICD-10-CM

## 2022-12-02 DIAGNOSIS — R200 Anesthesia of skin: Secondary | ICD-10-CM | POA: Diagnosis not present

## 2022-12-02 DIAGNOSIS — R202 Paresthesia of skin: Secondary | ICD-10-CM | POA: Diagnosis not present

## 2022-12-04 ENCOUNTER — Ambulatory Visit (INDEPENDENT_AMBULATORY_CARE_PROVIDER_SITE_OTHER): Payer: Medicare Other | Admitting: Family Medicine

## 2022-12-04 ENCOUNTER — Encounter: Payer: Self-pay | Admitting: Family Medicine

## 2022-12-04 VITALS — BP 134/55 | HR 72

## 2022-12-04 DIAGNOSIS — E119 Type 2 diabetes mellitus without complications: Secondary | ICD-10-CM | POA: Diagnosis not present

## 2022-12-04 DIAGNOSIS — R296 Repeated falls: Secondary | ICD-10-CM

## 2022-12-04 DIAGNOSIS — R2689 Other abnormalities of gait and mobility: Secondary | ICD-10-CM

## 2022-12-04 LAB — POCT GLYCOSYLATED HEMOGLOBIN (HGB A1C): Hemoglobin A1C: 6.1 % — AB (ref 4.0–5.6)

## 2022-12-04 NOTE — Patient Instructions (Signed)
.   Please review the attached list of medications and notify my office if there are any errors.   . Please bring all of your medications to every appointment so we can make sure that our medication list is the same as yours.   

## 2022-12-04 NOTE — Progress Notes (Unsigned)
I,[name],acting as a Education administrator for Lelon Huh, MD.,have documented all relevant documentation on the behalf of Lelon Huh, MD,as directed by  Lelon Huh, MD while in the presence of Lelon Huh, MD.   Established patient visit   Patient: Ernest James.   DOB: [birth year]   87 y.o. Male  MRN: [id number] Visit Date: 12/04/2022  Today's healthcare provider: Lelon Huh, MD   No chief complaint on file.  Subjective    HPI  Diabetes Mellitus Type II, Follow-up  Lab Results  Component Value Date   HGBA1C 6.5 (A) 08/03/2022   HGBA1C 5.8 05/07/2022   HGBA1C 6.5 (H) 04/29/2022   Wt Readings from Last 3 Encounters:  11/11/22 190 lb 1.6 oz (86.2 kg)  11/05/22 190 lb 1.6 oz (86.2 kg)  08/03/22 189 lb 6.4 oz (85.9 kg)   Last seen for diabetes 4 months ago.  Management since then includes continue current medications. Symptoms: No fatigue No foot ulcerations  No appetite changes No nausea  No paresthesia of the feet  No polydipsia  No polyuria No visual disturbances   No vomiting    Home blood sugar records:  not being checked Episodes of hypoglycemia? Yes {several months ago, restless} Current insulin regiment: {none} Most Recent Eye Exam: 04/03/22 ---------------------------------------------------------------------------------------------------   Medications: Outpatient Medications Prior to Visit  Medication Sig   acetaminophen (TYLENOL) 500 MG tablet Take 500 mg by mouth as needed for mild pain.   acetaminophen (TYLENOL) 650 MG CR tablet Take 650 mg by mouth as needed for pain.   amLODipine (NORVASC) 5 MG tablet Take 1 tablet (5 mg total) by mouth daily.   aspirin EC 81 MG tablet Take 81 mg by mouth daily.   atorvastatin (LIPITOR) 40 MG tablet Take 1 tablet by mouth once daily   Cholecalciferol (VITAMIN D3) 20 MCG TABS Take 1 tablet by mouth daily.    clopidogrel (PLAVIX) 75 MG tablet Take 1 tablet by mouth once daily   Cyanocobalamin  (VITAMIN B12) 1000 MCG TBCR Take 1 tablet by mouth daily.   finasteride (PROSCAR) 5 MG tablet Take 1 tablet (5 mg total) by mouth daily.   hydrochlorothiazide (HYDRODIURIL) 25 MG tablet Take 1 tablet (25 mg total) by mouth daily.   ibuprofen (ADVIL) 200 MG tablet Take 200 mg by mouth as needed.   levothyroxine (SYNTHROID) 75 MCG tablet Take 1 tablet (75 mcg total) by mouth daily before breakfast.   losartan (COZAAR) 50 MG tablet Take 1 tablet (50 mg total) by mouth daily.   meloxicam (MOBIC) 7.5 MG tablet Take 1 tablet (7.5 mg total) by mouth daily as needed for pain. (Patient not taking: Reported on 11/11/2022)   metFORMIN (GLUCOPHAGE-XR) 500 MG 24 hr tablet Take 1 tablet (500 mg total) by mouth daily with breakfast.   nystatin cream (MYCOSTATIN) Apply topically 2 (two) times daily. (Patient not taking: Reported on 11/11/2022)   polyethylene glycol powder (GLYCOLAX/MIRALAX) 17 GM/SCOOP powder Take 17 g by mouth daily as needed for mild constipation.   tamsulosin (FLOMAX) 0.4 MG CAPS capsule Take 1 capsule (0.4 mg total) by mouth daily after supper.   traZODone (DESYREL) 100 MG tablet Take 1 tablet (100 mg total) by mouth at bedtime.   Vitamin D, Ergocalciferol, (DRISDOL) 1.25 MG (50000 UNIT) CAPS capsule Take 50,000 Units by mouth every 7 (seven) days.   No facility-administered medications prior to visit.    Review of Systems  {Labs  Heme  Chem  Endocrine  Serology  Results Review (optional):23779}   Objective    There were no vitals taken for this visit. {Show previous vital signs (optional):23777}  Physical Exam  ***  No results found for any visits on 12/04/22.  Assessment & Plan     ***  No follow-ups on file.      {provider attestation***:1}   Lelon Huh, MD  [place] [phone] (phone) [phone] (fax)  Why

## 2022-12-06 LAB — MICROALBUMIN / CREATININE URINE RATIO
Creatinine, Urine: 32.2 mg/dL
Microalb/Creat Ratio: 22 mg/g creat (ref 0–29)
Microalbumin, Urine: 7.2 ug/mL

## 2022-12-08 ENCOUNTER — Ambulatory Visit
Admission: RE | Admit: 2022-12-08 | Discharge: 2022-12-08 | Disposition: A | Discharge status: Home / Self Care | Payer: Medicare Other | Source: Ambulatory Visit | Attending: Student | Admitting: Student

## 2022-12-08 DIAGNOSIS — M47812 Spondylosis without myelopathy or radiculopathy, cervical region: Secondary | ICD-10-CM | POA: Diagnosis not present

## 2022-12-08 DIAGNOSIS — M542 Cervicalgia: Secondary | ICD-10-CM | POA: Diagnosis not present

## 2022-12-08 DIAGNOSIS — R200 Anesthesia of skin: Secondary | ICD-10-CM | POA: Diagnosis not present

## 2022-12-12 ENCOUNTER — Other Ambulatory Visit: Payer: Self-pay | Admitting: Family Medicine

## 2022-12-12 DIAGNOSIS — I10 Essential (primary) hypertension: Secondary | ICD-10-CM

## 2022-12-29 DIAGNOSIS — H6123 Impacted cerumen, bilateral: Secondary | ICD-10-CM | POA: Diagnosis not present

## 2022-12-29 DIAGNOSIS — H903 Sensorineural hearing loss, bilateral: Secondary | ICD-10-CM | POA: Diagnosis not present

## 2022-12-31 ENCOUNTER — Other Ambulatory Visit: Payer: Self-pay | Admitting: Family Medicine

## 2022-12-31 DIAGNOSIS — E119 Type 2 diabetes mellitus without complications: Secondary | ICD-10-CM

## 2023-01-12 DIAGNOSIS — R296 Repeated falls: Secondary | ICD-10-CM | POA: Diagnosis not present

## 2023-01-12 DIAGNOSIS — M25671 Stiffness of right ankle, not elsewhere classified: Secondary | ICD-10-CM | POA: Diagnosis not present

## 2023-01-12 DIAGNOSIS — R262 Difficulty in walking, not elsewhere classified: Secondary | ICD-10-CM | POA: Diagnosis not present

## 2023-01-12 DIAGNOSIS — M25561 Pain in right knee: Secondary | ICD-10-CM | POA: Diagnosis not present

## 2023-01-12 DIAGNOSIS — M25672 Stiffness of left ankle, not elsewhere classified: Secondary | ICD-10-CM | POA: Diagnosis not present

## 2023-01-12 DIAGNOSIS — M25562 Pain in left knee: Secondary | ICD-10-CM | POA: Diagnosis not present

## 2023-01-14 DIAGNOSIS — Z8673 Personal history of transient ischemic attack (TIA), and cerebral infarction without residual deficits: Secondary | ICD-10-CM | POA: Diagnosis not present

## 2023-01-14 DIAGNOSIS — R569 Unspecified convulsions: Secondary | ICD-10-CM | POA: Diagnosis not present

## 2023-01-15 ENCOUNTER — Telehealth: Payer: Self-pay

## 2023-01-15 NOTE — Progress Notes (Signed)
Care Management & Coordination Services Pharmacy Team  Reason for Encounter: Diabetes  Contacted patient to discuss diabetes disease state.  Spoke with  Patient daughter   on 01/15/2023   Current antihyperglycemic regimen:  Metformin XR 500 mg daily    Patient verbally confirms he is taking the above medications as directed. Yes  What diet changes have been made to improve diabetes control?  What recent interventions/DTPs have been made to improve glycemic control:  None ID  Have there been any recent hospitalizations or ED visits since last visit with PharmD? No  Patient denies hypoglycemic symptoms, including Pale, Sweaty, Shaky, Hungry, Nervous/irritable, and Vision changes  Patient denies hyperglycemic symptoms, including blurry vision, excessive thirst, fatigue, polyuria, and weakness  How often are you checking your blood sugar?  Patient daughter states they do not check his blood sugar because they are unsure how to do this.I inform her they have a upcoming telephone appointment on 02/15/2023, and I could change it to a office appointment for Julious Payer, [employer] to assist.Patient daughter decline at this time as she states she has a friend that is a nurse who is going to help them, and if that is not successful she will return my call.  What are your blood sugars ranging?  None ID  During the week, how often does your blood glucose drop below 70?  Not checking blood sugars at home.  Are you checking your feet daily/regularly? Yes  Adherence Review: Is the patient currently on a STATIN medication? Yes Is the patient currently on ACE/ARB medication? Yes Does the patient have >5 day gap between last estimated fill dates? Yes  Star Rating Drugs:  Atorvastatin 40 mg Last Fill:  11/17/2022 90 Day Supply at [facility]. Losartan 50 mg Last Fill:  08/13/2022 60 Day Supply at [facility]. Metformin 500 mg Last Fill:  01/01/2023 90 Day Supply at [facility].    Care Gaps: Annual wellness visit in last year? Yes, last completed 11/11/2022 Colonoscopy COVID-19 Vaccine   If Diabetic: Last eye exam / retinopathy screening:last completed 04/03/2022 Last diabetic foot exam:unable to fine  Chart Updates:  Recent office visits:  12/04/2022 Dr. Sherrie Mustache MD (PCP) No Medication changes noted, lab work completed A1C- 6.1%, Ambulatory referral to Physical Therapy   Recent consult visits:  11/18/2022 Cristopher Peru (Neurology) Unable to see note  Hospital visits:  None in previous 6 months  Medications: Outpatient Encounter Medications as of 01/15/2023  Medication Sig Note   acetaminophen (TYLENOL) 500 MG tablet Take 500 mg by mouth as needed for mild pain.    acetaminophen (TYLENOL) 650 MG CR tablet Take 650 mg by mouth as needed for pain. 11/11/2022: Uses at night for pain   amLODipine (NORVASC) 5 MG tablet Take 1 tablet (5 mg total) by mouth daily.    aspirin EC 81 MG tablet Take 81 mg by mouth daily.    atorvastatin (LIPITOR) 40 MG tablet Take 1 tablet by mouth once daily    Cholecalciferol (VITAMIN D3) 20 MCG TABS Take 1 tablet by mouth daily.     clopidogrel (PLAVIX) 75 MG tablet Take 1 tablet by mouth once daily    Cyanocobalamin (VITAMIN B12) 1000 MCG TBCR Take 1 tablet by mouth daily.    finasteride (PROSCAR) 5 MG tablet Take 1 tablet (5 mg total) by mouth daily.    hydrochlorothiazide (HYDRODIURIL) 25 MG tablet Take 1 tablet by mouth once daily    ibuprofen (ADVIL) 200 MG tablet Take 200 mg by  mouth as needed.    levothyroxine (SYNTHROID) 75 MCG tablet Take 1 tablet (75 mcg total) by mouth daily before breakfast.    losartan (COZAAR) 50 MG tablet Take 1 tablet (50 mg total) by mouth daily. 11/11/2022: Daughter sts cardiologist increased to 75mg    meloxicam (MOBIC) 7.5 MG tablet Take 1 tablet (7.5 mg total) by mouth daily as needed for pain. (Patient not taking: Reported on 11/11/2022)    metFORMIN (GLUCOPHAGE-XR) 500 MG 24 hr tablet Take 1 tablet by  mouth once daily with breakfast    nystatin cream (MYCOSTATIN) Apply topically 2 (two) times daily. (Patient not taking: Reported on 11/11/2022)    polyethylene glycol powder (GLYCOLAX/MIRALAX) 17 GM/SCOOP powder Take 17 g by mouth daily as needed for mild constipation.    tamsulosin (FLOMAX) 0.4 MG CAPS capsule Take 1 capsule (0.4 mg total) by mouth daily after supper.    traZODone (DESYREL) 100 MG tablet Take 1 tablet (100 mg total) by mouth at bedtime. 11/11/2022: Pt takes 1/2 tablet as needed   Vitamin D, Ergocalciferol, (DRISDOL) 1.25 MG (50000 UNIT) CAPS capsule Take 50,000 Units by mouth every 7 (seven) days.    No facility-administered encounter medications on file as of 01/15/2023.    Recent Relevant Labs: Lab Results  Component Value Date/Time   HGBA1C 6.1 (A) 12/04/2022 02:19 PM   HGBA1C 128 12/04/2022 02:19 PM   HGBA1C 6.5 (A) 08/03/2022 04:04 PM   HGBA1C 5.8 05/07/2022 12:00 AM   HGBA1C 6.5 (H) 04/29/2022 10:00 AM   HGBA1C 6.7 (H) 01/14/2022 10:52 AM   HGBA1C 5.6 11/09/2011 12:00 AM    Kidney Function Lab Results  Component Value Date/Time   CREATININE 0.95 08/03/2022 04:29 PM   CREATININE 1.01 05/06/2022 12:08 PM   GFRNONAA >60 05/06/2022 12:08 PM   GFRAA >60 12/23/2019 01:52 PM    Bess[employer] Clinical Pharmacist Assistant [phone]

## 2023-01-19 DIAGNOSIS — M25672 Stiffness of left ankle, not elsewhere classified: Secondary | ICD-10-CM | POA: Diagnosis not present

## 2023-01-19 DIAGNOSIS — M25562 Pain in left knee: Secondary | ICD-10-CM | POA: Diagnosis not present

## 2023-01-19 DIAGNOSIS — R296 Repeated falls: Secondary | ICD-10-CM | POA: Diagnosis not present

## 2023-01-19 DIAGNOSIS — R262 Difficulty in walking, not elsewhere classified: Secondary | ICD-10-CM | POA: Diagnosis not present

## 2023-01-19 DIAGNOSIS — M25561 Pain in right knee: Secondary | ICD-10-CM | POA: Diagnosis not present

## 2023-01-19 DIAGNOSIS — M25671 Stiffness of right ankle, not elsewhere classified: Secondary | ICD-10-CM | POA: Diagnosis not present

## 2023-01-27 DIAGNOSIS — M25671 Stiffness of right ankle, not elsewhere classified: Secondary | ICD-10-CM | POA: Diagnosis not present

## 2023-01-27 DIAGNOSIS — M25672 Stiffness of left ankle, not elsewhere classified: Secondary | ICD-10-CM | POA: Diagnosis not present

## 2023-01-27 DIAGNOSIS — M25562 Pain in left knee: Secondary | ICD-10-CM | POA: Diagnosis not present

## 2023-01-27 DIAGNOSIS — R262 Difficulty in walking, not elsewhere classified: Secondary | ICD-10-CM | POA: Diagnosis not present

## 2023-01-27 DIAGNOSIS — M25561 Pain in right knee: Secondary | ICD-10-CM | POA: Diagnosis not present

## 2023-01-27 DIAGNOSIS — R296 Repeated falls: Secondary | ICD-10-CM | POA: Diagnosis not present

## 2023-01-28 DIAGNOSIS — E1142 Type 2 diabetes mellitus with diabetic polyneuropathy: Secondary | ICD-10-CM | POA: Diagnosis not present

## 2023-01-28 DIAGNOSIS — M2041 Other hammer toe(s) (acquired), right foot: Secondary | ICD-10-CM | POA: Diagnosis not present

## 2023-01-28 DIAGNOSIS — M79675 Pain in left toe(s): Secondary | ICD-10-CM | POA: Diagnosis not present

## 2023-01-28 DIAGNOSIS — B351 Tinea unguium: Secondary | ICD-10-CM | POA: Diagnosis not present

## 2023-01-28 DIAGNOSIS — M79674 Pain in right toe(s): Secondary | ICD-10-CM | POA: Diagnosis not present

## 2023-01-28 DIAGNOSIS — M2042 Other hammer toe(s) (acquired), left foot: Secondary | ICD-10-CM | POA: Diagnosis not present

## 2023-01-29 ENCOUNTER — Other Ambulatory Visit (INDEPENDENT_AMBULATORY_CARE_PROVIDER_SITE_OTHER): Payer: Self-pay | Admitting: Vascular Surgery

## 2023-01-29 DIAGNOSIS — M25672 Stiffness of left ankle, not elsewhere classified: Secondary | ICD-10-CM | POA: Diagnosis not present

## 2023-01-29 DIAGNOSIS — R296 Repeated falls: Secondary | ICD-10-CM | POA: Diagnosis not present

## 2023-01-29 DIAGNOSIS — I6523 Occlusion and stenosis of bilateral carotid arteries: Secondary | ICD-10-CM

## 2023-01-29 DIAGNOSIS — R262 Difficulty in walking, not elsewhere classified: Secondary | ICD-10-CM | POA: Diagnosis not present

## 2023-01-29 DIAGNOSIS — M25671 Stiffness of right ankle, not elsewhere classified: Secondary | ICD-10-CM | POA: Diagnosis not present

## 2023-01-29 DIAGNOSIS — M25562 Pain in left knee: Secondary | ICD-10-CM | POA: Diagnosis not present

## 2023-01-29 DIAGNOSIS — M25561 Pain in right knee: Secondary | ICD-10-CM | POA: Diagnosis not present

## 2023-02-03 DIAGNOSIS — M25671 Stiffness of right ankle, not elsewhere classified: Secondary | ICD-10-CM | POA: Diagnosis not present

## 2023-02-03 DIAGNOSIS — R262 Difficulty in walking, not elsewhere classified: Secondary | ICD-10-CM | POA: Diagnosis not present

## 2023-02-03 DIAGNOSIS — R296 Repeated falls: Secondary | ICD-10-CM | POA: Diagnosis not present

## 2023-02-03 DIAGNOSIS — M25672 Stiffness of left ankle, not elsewhere classified: Secondary | ICD-10-CM | POA: Diagnosis not present

## 2023-02-03 DIAGNOSIS — M25561 Pain in right knee: Secondary | ICD-10-CM | POA: Diagnosis not present

## 2023-02-03 DIAGNOSIS — M25562 Pain in left knee: Secondary | ICD-10-CM | POA: Diagnosis not present

## 2023-02-04 ENCOUNTER — Ambulatory Visit (INDEPENDENT_AMBULATORY_CARE_PROVIDER_SITE_OTHER): Payer: Medicare Other

## 2023-02-04 ENCOUNTER — Encounter (INDEPENDENT_AMBULATORY_CARE_PROVIDER_SITE_OTHER): Payer: Self-pay

## 2023-02-04 ENCOUNTER — Ambulatory Visit (INDEPENDENT_AMBULATORY_CARE_PROVIDER_SITE_OTHER): Payer: Medicare Other | Admitting: Vascular Surgery

## 2023-02-04 DIAGNOSIS — I6523 Occlusion and stenosis of bilateral carotid arteries: Secondary | ICD-10-CM | POA: Diagnosis not present

## 2023-02-09 ENCOUNTER — Telehealth: Payer: Self-pay

## 2023-02-09 DIAGNOSIS — M25561 Pain in right knee: Secondary | ICD-10-CM | POA: Diagnosis not present

## 2023-02-09 DIAGNOSIS — M25562 Pain in left knee: Secondary | ICD-10-CM | POA: Diagnosis not present

## 2023-02-09 DIAGNOSIS — R296 Repeated falls: Secondary | ICD-10-CM | POA: Diagnosis not present

## 2023-02-09 DIAGNOSIS — M25672 Stiffness of left ankle, not elsewhere classified: Secondary | ICD-10-CM | POA: Diagnosis not present

## 2023-02-09 DIAGNOSIS — R262 Difficulty in walking, not elsewhere classified: Secondary | ICD-10-CM | POA: Diagnosis not present

## 2023-02-09 DIAGNOSIS — M25671 Stiffness of right ankle, not elsewhere classified: Secondary | ICD-10-CM | POA: Diagnosis not present

## 2023-02-09 NOTE — Progress Notes (Signed)
Patient daughter states patient is going to have cataract extract surgery soon, and was inform the provider would like for him to start fish oil omega-3 2000 mg daily unless he was inform in the past not to take fish oil omega 3 .Patient daughter is asking if he can take fish oil omega - 3, and if  it is okay for him to take it  with his other medications.  Patient daughter states she also notice from an AVS that was given on 12/04/2022 when he had appointment with his PCP, that Vitamin D 1.25 capsule take by mouth every 7 days was listed on his med list.Patient daughter is asking if this is something he suppose to be taking as they were never aware of this.If so patient would need a prescription.  Notified Clinical pharmacist of above.  02/10/2023 Per Clinical pharmacist, Omega-3 can help prevent dry eye symptoms that occur after surgery and improve healing. However fish oil can increase risk of bleeding when on medications like aspirin and clopidogrel. I would recommend he start the fish oil supplement and take it daily for 1-2 months to help with his recovery. they should monitor for signs of bleeding and bruising and contact the doctor if he notes any serious bleeding.    As far as the Vitamin D2, I don't know who originally prescribed that for him but I don't see any compelling reason he needs to resume that. I would recommend he pick up a Vitamin D3 2000 units daily over the counter and resume taking that.   Patient daughter states the provider is wanting him to take Fish oil before his surgery for a measurement test due to his dry eyes.Patient daughter is asking if that is okay to do that as she wants to make sure it is.Notified Clinical pharmacist.  Per Clinical pharmacist, Yes that is fine.   Patient daughter Verbalized understanding.   Everlean Cherry Clinical Pharmacist Assistant [phone]

## 2023-02-10 ENCOUNTER — Encounter: Payer: Self-pay | Admitting: Anesthesiology

## 2023-02-12 DIAGNOSIS — R296 Repeated falls: Secondary | ICD-10-CM | POA: Diagnosis not present

## 2023-02-12 DIAGNOSIS — M25561 Pain in right knee: Secondary | ICD-10-CM | POA: Diagnosis not present

## 2023-02-12 DIAGNOSIS — M25672 Stiffness of left ankle, not elsewhere classified: Secondary | ICD-10-CM | POA: Diagnosis not present

## 2023-02-12 DIAGNOSIS — R262 Difficulty in walking, not elsewhere classified: Secondary | ICD-10-CM | POA: Diagnosis not present

## 2023-02-12 DIAGNOSIS — M25671 Stiffness of right ankle, not elsewhere classified: Secondary | ICD-10-CM | POA: Diagnosis not present

## 2023-02-12 DIAGNOSIS — M25562 Pain in left knee: Secondary | ICD-10-CM | POA: Diagnosis not present

## 2023-02-13 ENCOUNTER — Other Ambulatory Visit: Payer: Self-pay | Admitting: Family Medicine

## 2023-02-13 DIAGNOSIS — E782 Mixed hyperlipidemia: Secondary | ICD-10-CM

## 2023-02-15 DIAGNOSIS — M3501 Sicca syndrome with keratoconjunctivitis: Secondary | ICD-10-CM | POA: Diagnosis not present

## 2023-02-15 DIAGNOSIS — H02889 Meibomian gland dysfunction of unspecified eye, unspecified eyelid: Secondary | ICD-10-CM | POA: Diagnosis not present

## 2023-02-15 NOTE — Telephone Encounter (Signed)
Requested Prescriptions  Pending Prescriptions Disp Refills   atorvastatin (LIPITOR) 40 MG tablet [Pharmacy Med Name: Atorvastatin Calcium 40 MG Oral Tablet] 90 tablet 0    Sig: Take 1 tablet by mouth once daily     Cardiovascular:  Antilipid - Statins Failed - 02/13/2023 10:30 AM      Failed - Lipid Panel in normal range within the last 12 months    Cholesterol, Total  Date Value Ref Range Status  01/14/2022 115 100 - 199 mg/dL Final   LDL Chol Calc (NIH)  Date Value Ref Range Status  01/14/2022 37 0 - 99 mg/dL Final   HDL  Date Value Ref Range Status  01/14/2022 45 >39 mg/dL Final   Triglycerides  Date Value Ref Range Status  01/14/2022 212 (H) 0 - 149 mg/dL Final         Passed - Patient is not pregnant      Passed - Valid encounter within last 12 months    Recent Outpatient Visits           2 months ago Diabetes mellitus without complication (HCC)   [place] [facility]va Limes, [facility]   6 months ago Diabetes mellitus without complication [facility])   [place] [facility] Malva Limes, MD   9 months ago Transient neurologic deficit   [facility], [facility]   9 months ago Renal artery stenosis [facility])   [place] [facility] Malva Limes, MD   9 months ago Insomnia, unspecified type   [facility] Malva Limes, MD       Future Appointments             In 2 months Fisher, Demetrios Isaacs, MD [facility]   In 2 months Richardo Hanks, Laurette Schimke, MD [facility] [place]

## 2023-02-16 DIAGNOSIS — M25671 Stiffness of right ankle, not elsewhere classified: Secondary | ICD-10-CM | POA: Diagnosis not present

## 2023-02-16 DIAGNOSIS — M25561 Pain in right knee: Secondary | ICD-10-CM | POA: Diagnosis not present

## 2023-02-16 DIAGNOSIS — R296 Repeated falls: Secondary | ICD-10-CM | POA: Diagnosis not present

## 2023-02-16 DIAGNOSIS — R262 Difficulty in walking, not elsewhere classified: Secondary | ICD-10-CM | POA: Diagnosis not present

## 2023-02-16 DIAGNOSIS — M25562 Pain in left knee: Secondary | ICD-10-CM | POA: Diagnosis not present

## 2023-02-16 DIAGNOSIS — M25672 Stiffness of left ankle, not elsewhere classified: Secondary | ICD-10-CM | POA: Diagnosis not present

## 2023-02-19 DIAGNOSIS — M25672 Stiffness of left ankle, not elsewhere classified: Secondary | ICD-10-CM | POA: Diagnosis not present

## 2023-02-19 DIAGNOSIS — R296 Repeated falls: Secondary | ICD-10-CM | POA: Diagnosis not present

## 2023-02-19 DIAGNOSIS — M25562 Pain in left knee: Secondary | ICD-10-CM | POA: Diagnosis not present

## 2023-02-19 DIAGNOSIS — M25671 Stiffness of right ankle, not elsewhere classified: Secondary | ICD-10-CM | POA: Diagnosis not present

## 2023-02-19 DIAGNOSIS — M25561 Pain in right knee: Secondary | ICD-10-CM | POA: Diagnosis not present

## 2023-02-19 DIAGNOSIS — R262 Difficulty in walking, not elsewhere classified: Secondary | ICD-10-CM | POA: Diagnosis not present

## 2023-02-22 DIAGNOSIS — R296 Repeated falls: Secondary | ICD-10-CM | POA: Diagnosis not present

## 2023-02-22 DIAGNOSIS — R262 Difficulty in walking, not elsewhere classified: Secondary | ICD-10-CM | POA: Diagnosis not present

## 2023-02-22 DIAGNOSIS — M25562 Pain in left knee: Secondary | ICD-10-CM | POA: Diagnosis not present

## 2023-02-22 DIAGNOSIS — M25672 Stiffness of left ankle, not elsewhere classified: Secondary | ICD-10-CM | POA: Diagnosis not present

## 2023-02-22 DIAGNOSIS — M25671 Stiffness of right ankle, not elsewhere classified: Secondary | ICD-10-CM | POA: Diagnosis not present

## 2023-02-22 DIAGNOSIS — M25561 Pain in right knee: Secondary | ICD-10-CM | POA: Diagnosis not present

## 2023-02-23 ENCOUNTER — Ambulatory Visit: Admission: RE | Admit: 2023-02-23 | Payer: Medicare Other | Source: Ambulatory Visit | Admitting: Ophthalmology

## 2023-02-23 ENCOUNTER — Encounter: Admission: RE | Payer: Self-pay | Source: Ambulatory Visit

## 2023-02-23 SURGERY — PHACOEMULSIFICATION, CATARACT, WITH IOL INSERTION
Anesthesia: Topical | Laterality: Left

## 2023-02-26 DIAGNOSIS — M25671 Stiffness of right ankle, not elsewhere classified: Secondary | ICD-10-CM | POA: Diagnosis not present

## 2023-02-26 DIAGNOSIS — R296 Repeated falls: Secondary | ICD-10-CM | POA: Diagnosis not present

## 2023-02-26 DIAGNOSIS — M25562 Pain in left knee: Secondary | ICD-10-CM | POA: Diagnosis not present

## 2023-02-26 DIAGNOSIS — M25672 Stiffness of left ankle, not elsewhere classified: Secondary | ICD-10-CM | POA: Diagnosis not present

## 2023-02-26 DIAGNOSIS — M25561 Pain in right knee: Secondary | ICD-10-CM | POA: Diagnosis not present

## 2023-02-26 DIAGNOSIS — R262 Difficulty in walking, not elsewhere classified: Secondary | ICD-10-CM | POA: Diagnosis not present

## 2023-03-01 DIAGNOSIS — H02889 Meibomian gland dysfunction of unspecified eye, unspecified eyelid: Secondary | ICD-10-CM | POA: Diagnosis not present

## 2023-03-01 DIAGNOSIS — M3501 Sicca syndrome with keratoconjunctivitis: Secondary | ICD-10-CM | POA: Diagnosis not present

## 2023-03-02 DIAGNOSIS — M25671 Stiffness of right ankle, not elsewhere classified: Secondary | ICD-10-CM | POA: Diagnosis not present

## 2023-03-02 DIAGNOSIS — M25561 Pain in right knee: Secondary | ICD-10-CM | POA: Diagnosis not present

## 2023-03-02 DIAGNOSIS — M25672 Stiffness of left ankle, not elsewhere classified: Secondary | ICD-10-CM | POA: Diagnosis not present

## 2023-03-02 DIAGNOSIS — M25562 Pain in left knee: Secondary | ICD-10-CM | POA: Diagnosis not present

## 2023-03-02 DIAGNOSIS — R296 Repeated falls: Secondary | ICD-10-CM | POA: Diagnosis not present

## 2023-03-02 DIAGNOSIS — R262 Difficulty in walking, not elsewhere classified: Secondary | ICD-10-CM | POA: Diagnosis not present

## 2023-03-03 DIAGNOSIS — Z8673 Personal history of transient ischemic attack (TIA), and cerebral infarction without residual deficits: Secondary | ICD-10-CM | POA: Diagnosis not present

## 2023-03-03 DIAGNOSIS — G3184 Mild cognitive impairment, so stated: Secondary | ICD-10-CM | POA: Diagnosis not present

## 2023-03-03 DIAGNOSIS — M542 Cervicalgia: Secondary | ICD-10-CM | POA: Diagnosis not present

## 2023-03-05 DIAGNOSIS — M25562 Pain in left knee: Secondary | ICD-10-CM | POA: Diagnosis not present

## 2023-03-05 DIAGNOSIS — M25672 Stiffness of left ankle, not elsewhere classified: Secondary | ICD-10-CM | POA: Diagnosis not present

## 2023-03-05 DIAGNOSIS — M25671 Stiffness of right ankle, not elsewhere classified: Secondary | ICD-10-CM | POA: Diagnosis not present

## 2023-03-05 DIAGNOSIS — R262 Difficulty in walking, not elsewhere classified: Secondary | ICD-10-CM | POA: Diagnosis not present

## 2023-03-05 DIAGNOSIS — M25561 Pain in right knee: Secondary | ICD-10-CM | POA: Diagnosis not present

## 2023-03-05 DIAGNOSIS — R296 Repeated falls: Secondary | ICD-10-CM | POA: Diagnosis not present

## 2023-03-08 DIAGNOSIS — U071 COVID-19: Secondary | ICD-10-CM

## 2023-03-08 HISTORY — DX: COVID-19: U07.1

## 2023-03-09 ENCOUNTER — Ambulatory Visit: Admit: 2023-03-09 | Payer: Medicare Other | Admitting: Ophthalmology

## 2023-03-09 SURGERY — PHACOEMULSIFICATION, CATARACT, WITH IOL INSERTION
Anesthesia: Topical | Laterality: Right

## 2023-03-15 DIAGNOSIS — Z85828 Personal history of other malignant neoplasm of skin: Secondary | ICD-10-CM | POA: Diagnosis not present

## 2023-03-15 DIAGNOSIS — L821 Other seborrheic keratosis: Secondary | ICD-10-CM | POA: Diagnosis not present

## 2023-03-15 DIAGNOSIS — L570 Actinic keratosis: Secondary | ICD-10-CM | POA: Diagnosis not present

## 2023-03-15 DIAGNOSIS — Z08 Encounter for follow-up examination after completed treatment for malignant neoplasm: Secondary | ICD-10-CM | POA: Diagnosis not present

## 2023-03-15 DIAGNOSIS — D3612 Benign neoplasm of peripheral nerves and autonomic nervous system, upper limb, including shoulder: Secondary | ICD-10-CM | POA: Diagnosis not present

## 2023-03-15 DIAGNOSIS — D225 Melanocytic nevi of trunk: Secondary | ICD-10-CM | POA: Diagnosis not present

## 2023-03-16 ENCOUNTER — Other Ambulatory Visit: Payer: Self-pay | Admitting: Family Medicine

## 2023-03-16 DIAGNOSIS — M25672 Stiffness of left ankle, not elsewhere classified: Secondary | ICD-10-CM | POA: Diagnosis not present

## 2023-03-16 DIAGNOSIS — R262 Difficulty in walking, not elsewhere classified: Secondary | ICD-10-CM | POA: Diagnosis not present

## 2023-03-16 DIAGNOSIS — M25562 Pain in left knee: Secondary | ICD-10-CM | POA: Diagnosis not present

## 2023-03-16 DIAGNOSIS — R296 Repeated falls: Secondary | ICD-10-CM | POA: Diagnosis not present

## 2023-03-16 DIAGNOSIS — I10 Essential (primary) hypertension: Secondary | ICD-10-CM

## 2023-03-16 DIAGNOSIS — M25561 Pain in right knee: Secondary | ICD-10-CM | POA: Diagnosis not present

## 2023-03-16 DIAGNOSIS — M25671 Stiffness of right ankle, not elsewhere classified: Secondary | ICD-10-CM | POA: Diagnosis not present

## 2023-03-19 DIAGNOSIS — M25561 Pain in right knee: Secondary | ICD-10-CM | POA: Diagnosis not present

## 2023-03-19 DIAGNOSIS — M25562 Pain in left knee: Secondary | ICD-10-CM | POA: Diagnosis not present

## 2023-03-19 DIAGNOSIS — M25671 Stiffness of right ankle, not elsewhere classified: Secondary | ICD-10-CM | POA: Diagnosis not present

## 2023-03-19 DIAGNOSIS — R296 Repeated falls: Secondary | ICD-10-CM | POA: Diagnosis not present

## 2023-03-19 DIAGNOSIS — M25672 Stiffness of left ankle, not elsewhere classified: Secondary | ICD-10-CM | POA: Diagnosis not present

## 2023-03-19 DIAGNOSIS — R262 Difficulty in walking, not elsewhere classified: Secondary | ICD-10-CM | POA: Diagnosis not present

## 2023-03-23 DIAGNOSIS — R296 Repeated falls: Secondary | ICD-10-CM | POA: Diagnosis not present

## 2023-03-23 DIAGNOSIS — M25672 Stiffness of left ankle, not elsewhere classified: Secondary | ICD-10-CM | POA: Diagnosis not present

## 2023-03-23 DIAGNOSIS — M25561 Pain in right knee: Secondary | ICD-10-CM | POA: Diagnosis not present

## 2023-03-23 DIAGNOSIS — R262 Difficulty in walking, not elsewhere classified: Secondary | ICD-10-CM | POA: Diagnosis not present

## 2023-03-23 DIAGNOSIS — M25671 Stiffness of right ankle, not elsewhere classified: Secondary | ICD-10-CM | POA: Diagnosis not present

## 2023-03-23 DIAGNOSIS — M25562 Pain in left knee: Secondary | ICD-10-CM | POA: Diagnosis not present

## 2023-03-25 ENCOUNTER — Ambulatory Visit
Admission: EM | Admit: 2023-03-25 | Discharge: 2023-03-25 | Disposition: A | Discharge status: Home / Self Care | Payer: Medicare Other | Attending: Family Medicine | Admitting: Family Medicine

## 2023-03-25 DIAGNOSIS — U071 COVID-19: Secondary | ICD-10-CM | POA: Insufficient documentation

## 2023-03-25 LAB — SARS CORONAVIRUS 2 BY RT PCR: SARS Coronavirus 2 by RT PCR: POSITIVE — AB

## 2023-03-25 LAB — GROUP A STREP BY PCR: Group A Strep by PCR: NOT DETECTED

## 2023-03-25 MED ORDER — IPRATROPIUM BROMIDE 0.06 % NA SOLN: 2.0000 | Freq: Four times a day (QID) | NASAL | 12 refills | Status: DC

## 2023-03-25 MED ORDER — BENZONATATE 100 MG PO CAPS: 100.0000 mg | ORAL_CAPSULE | Freq: Three times a day (TID) | ORAL | 0 refills | Status: DC

## 2023-03-25 MED ORDER — PAXLOVID (300/100) 20 X 150 MG & 10 X 100MG PO TBPK: 3.0000 | ORAL_TABLET | Freq: Two times a day (BID) | ORAL | 0 refills | Status: DC

## 2023-03-25 MED ORDER — ALBUTEROL SULFATE HFA 108 (90 BASE) MCG/ACT IN AERS: 2.0000 | INHALATION_SPRAY | RESPIRATORY_TRACT | 0 refills | Status: DC | PRN

## 2023-03-25 MED ORDER — PAXLOVID (300/100) 20 X 150 MG & 10 X 100MG PO TBPK: 3.0000 | ORAL_TABLET | Freq: Two times a day (BID) | ORAL | 0 refills | Status: AC

## 2023-03-25 NOTE — Discharge Instructions (Addendum)
Your  test for COVID-19 was positive, meaning that you were infected with the novel coronavirus and could give the germ to others.  The recommendations suggest returning to normal activities when, for at least 24 hours, symptoms are improving overall, and if a fever was present, it has been gone without use of a fever-reducing medication.  I prescribed Paxlovid which can cause symptoms to return after completing it.  Take a home antigen test to see, if your symptoms come back. Drug interactions: Take your amlodipine every other day.  Hold your Flomax as finasteride.  If you start having urinary frequency, call your regular doctor.  I prescribed albuterol for chest tightness/shortness of breath.  Tessalon Perles/benzoate capsules are used for cough.  Ipratropium nasal spray for any nasal congestion. Paxlovid may cause you to have new symptoms.  Most people do really well with this however this is a possibility. You should wear a mask for the next 5 days to prevent the spread of disease. Please continue good preventive care measures, including:  frequent hand-washing, avoid touching your face, cover coughs/sneezes, stay out of crowds and keep a 6 foot distance from others.  Go to the nearest hospital emergency room if fever/cough/breathlessness are severe or illness seems like a threat to life.  If your were prescribed medication. Stop by the pharmacy to pick it up. You can take Tylenol and/or Ibuprofen as needed for fever reduction and pain relief.    For cough: honey 1/2 to 1 teaspoon (you can dilute the honey in water or another fluid).  You can also use guaifenesin and dextromethorphan for cough. You can use a humidifier for chest congestion and cough.  If you don't have a humidifier, you can sit in the bathroom with the hot shower running.      For sore throat: try warm salt water gargles, Mucinex sore throat cough drops or cepacol lozenges, throat spray, warm tea or water with lemon/honey, popsicles  or ice, or OTC cold relief medicine for throat discomfort. You can also purchase chloraseptic spray at the pharmacy or dollar store.   For congestion: take a daily anti-histamine like Zyrtec, Claritin, and a oral decongestant, such as pseudoephedrine.  You can also use Flonase 1-2 sprays in each nostril daily. Afrin is also a good option, if you do not have high blood pressure.    It is important to stay hydrated: drink plenty of fluids (water, gatorade/powerade/pedialyte, juices, or teas) to keep your throat moisturized and help further relieve irritation/discomfort.    Return or go to the Emergency Department if symptoms worsen or do not improve in the next few days

## 2023-03-25 NOTE — ED Triage Notes (Signed)
Pt c/o sore throat x1 day. Denies any fevers,chills or bodyaches.

## 2023-03-25 NOTE — ED Provider Notes (Signed)
[facility]    CSN: [id number] Arrival date & time: 03/25/23  1847      History   Chief Complaint Chief Complaint  Patient presents with   Sore Throat    HPI [facility] Ernest Raz. is a 87 y.o. male.   HPI  History obtained from the patient. Ernest James presents for scratchy throat when he got up this morning which has gotten progressively worse. He has a slight dry cough. No recent sick contacts. Denies fever, chills, body aches, nausea, abdominal pain, chest pain, shortness of breath, vomiting, diarrhea. Nothing taken for pain. Concerned he may have strep or COVID.  He has never had COVID.     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[facility])     Patient Active Problem List   Diagnosis Date Noted   Renal artery stenosis (HCC) 04/27/2022   Chronic mesenteric ischemia (HCC) 04/27/2022   Carotid stenosis, symptomatic w/o infarct, right 04/14/2022   OSA (obstructive sleep apnea) 01/24/2020   1st degree AV block 05/30/2019   Hyperlipidemia 12/16/2017   Insomnia 03/30/2017   BPH (benign prostatic hyperplasia) 05/27/2016   Carotid stenosis 11/27/2015   TIA (transient ischemic attack) 11/27/2015   Allergic rhinitis, seasonal 11/22/2015   Family history of early CAD 11/22/2015   Phimosis 01/29/2014   Diabetes mellitus without complication (HCC) 01/19/2013   Abnormal liver enzymes 03/24/2009   Pure hyperglyceridemia 03/20/2009   Hypothyroidism 09/18/2008   History of adenomatous polyp of colon 09/07/2004   Primary hypertension 02/16/2002   Esophagitis, reflux 02/16/2002    Past Surgical History:  Procedure Laterality Date   CAROTID PTA/STENT INTERVENTION Left 04/14/2022   Procedure: CAROTID PTA/STENT INTERVENTION;  Surgeon:  Renford Dills, MD;  Location: [facility] INVASIVE CV LAB;  Service: Cardiovascular;  Laterality: Left;   CIRCUMCISION     COLONOSCOPY WITH PROPOFOL N/A 03/31/2016   Procedure: COLONOSCOPY WITH PROPOFOL;  Surgeon: Christena Deem, MD;  Location: [facility] ENDOSCOPY;  Service: Endoscopy;  Laterality: N/A;   MRI     SINUS SURGERY  1994   on left side   TONSILLECTOMY         Home Medications    Prior to Admission medications   Medication Sig Start Date End Date Taking? Authorizing Provider  acetaminophen (TYLENOL) 500 MG tablet Take 500 mg by mouth as needed for mild pain.   Yes [provider]  acetaminophen (TYLENOL) 650 MG CR tablet Take 650 mg by mouth as needed for pain.   Yes [provider]  amLODipine (NORVASC) 5 MG tablet Take 1 tablet by mouth once daily 03/17/23  Yes Fisher, Demetrios Isaacs, MD  aspirin EC 81 MG tablet Take 81 mg by mouth daily.   Yes [provider]  atorvastatin (LIPITOR) 40 MG tablet Take 1 tablet by mouth once daily 02/15/23  Yes Fisher, Demetrios Isaacs, MD  Cholecalciferol (VITAMIN D3) 20 MCG TABS Take 1 tablet by mouth daily.    Yes [provider]  clopidogrel (PLAVIX) 75 MG tablet Take 1 tablet by mouth once daily 11/09/22  Yes Schnier, Latina Craver, MD  Cyanocobalamin (VITAMIN B12) 1000 MCG TBCR Take 1 tablet by mouth daily.   Yes [provider]  finasteride (PROSCAR) 5  MG tablet Take 1 tablet (5 mg total) by mouth daily. 11/05/22  Yes Sondra Come, MD  hydrochlorothiazide (HYDRODIURIL) 25 MG tablet Take 1 tablet by mouth once daily 12/13/22  Yes Fisher, Demetrios Isaacs, MD  ibuprofen (ADVIL) 200 MG tablet Take 200 mg by mouth as needed.   Yes [provider]  levothyroxine (SYNTHROID) 75 MCG tablet Take 1 tablet (75 mcg total) by mouth daily before breakfast. 10/21/22  Yes Fisher, Demetrios Isaacs, MD  losartan (COZAAR) 50 MG tablet Take 1 tablet (50 mg total) by mouth daily. 10/21/22  Yes Malva Limes, MD  loteprednol (LOTEMAX) 0.5 %  ophthalmic suspension 1 drop 2 (two) times daily. 03/15/23  Yes [provider]  metFORMIN (GLUCOPHAGE-XR) 500 MG 24 hr tablet Take 1 tablet by mouth once daily with breakfast 01/01/23  Yes Fisher, Demetrios Isaacs, MD  polyethylene glycol powder (GLYCOLAX/MIRALAX) 17 GM/SCOOP powder Take 17 g by mouth daily as needed for mild constipation.   Yes [provider]  tamsulosin (FLOMAX) 0.4 MG CAPS capsule Take 1 capsule (0.4 mg total) by mouth daily after supper. 11/05/22  Yes Sondra Come, MD  traZODone (DESYREL) 100 MG tablet Take 1 tablet (100 mg total) by mouth at bedtime. 10/21/22  Yes Malva Limes, MD  Vitamin D, Ergocalciferol, (DRISDOL) 1.25 MG (50000 UNIT) CAPS capsule Take 50,000 Units by mouth every 7 (seven) days.   Yes [provider]  albuterol (VENTOLIN HFA) 108 (90 Base) MCG/ACT inhaler Inhale 2 puffs into the lungs every 4 (four) hours as needed. 03/25/23   Admir Candelas, Seward Meth, DO  benzonatate (TESSALON) 100 MG capsule Take 1 capsule (100 mg total) by mouth every 8 (eight) hours. 03/25/23   Ashish Rossetti, Seward Meth, DO  ipratropium (ATROVENT) 0.06 % nasal spray Place 2 sprays into both nostrils 4 (four) times daily. 03/25/23   Katha Cabal, DO  nirmatrelvir & ritonavir (PAXLOVID, 300/100,) 20 x 150 MG & 10 x 100MG  TBPK Take 3 tablets by mouth 2 (two) times daily for 5 days. 03/25/23 03/30/23  Katha Cabal, DO    Family History Family History  Problem Relation Age of Onset   Alzheimer's disease Mother    CAD Father    Diabetes Sister        type 2    Social History Social History   Tobacco Use   Smoking status: Never    Passive exposure: Never   Smokeless tobacco: Never  Vaping Use   Vaping status: Never Used  Substance Use Topics   Alcohol use: No    Alcohol/week: 0.0 standard drinks of alcohol   Drug use: No     Allergies   Patient has no known allergies.   Review of Systems Review of Systems: negative unless otherwise stated in HPI.       Physical Exam Triage Vital Signs ED Triage Vitals  Encounter Vitals Group     BP 03/25/23 1856 (!) 159/77     Systolic BP Percentile --      Diastolic BP Percentile --      Pulse Rate 03/25/23 1856 70     Resp 03/25/23 1856 16     Temp 03/25/23 1856 98.7 F (37.1 C)     Temp Source 03/25/23 1856 Oral     SpO2 03/25/23 1856 98 %     Weight 03/25/23 1855 185 lb (83.9 kg)     Height 03/25/23 1855 5' 9.5" (1.765 m)     Head Circumference --  Peak Flow --      Pain Score 03/25/23 1901 0     Pain Loc --      Pain Education --      Exclude from Growth Chart --    No data found.  Updated Vital Signs BP (!) 159/77 (BP Location: Left Arm)   Pulse 70   Temp 98.7 F (37.1 C) (Oral)   Resp 16   Ht 5' 9.5" (1.765 m)   Wt 83.9 kg   SpO2 98%   BMI 26.93 kg/m   Visual Acuity Right Eye Distance:   Left Eye Distance:   Bilateral Distance:    Right Eye Near:   Left Eye Near:    Bilateral Near:     Physical Exam GEN:     alert, non-ill appearing elderly male in no distress    HENT:  mucus membranes moist, oropharyngeal without lesions or erythema, no tonsillar hypertrophy or exudates, no nasal discharge, bilateral TM normal EYES:   pupils equal and reactive, no scleral injection or discharge NECK:  normal ROM, no lymphadenopathy, no meningismus   RESP:  no increased work of breathing, clear to auscultation bilaterally CVS:   regular rate and rhythm Skin:   warm and dry, no rash on visible skin    UC Treatments / Results  Labs (all labs ordered are listed, but only abnormal results are displayed) Labs Reviewed  SARS CORONAVIRUS 2 BY RT PCR - Abnormal; Notable for the following components:      Result Value   SARS Coronavirus 2 by RT PCR POSITIVE (*)    All other components within normal limits  GROUP A STREP BY PCR    EKG   Radiology No results found.  Procedures Procedures (including critical care time)  Medications Ordered in UC Medications - No  data to display  Initial Impression / Assessment and Plan / UC Course  I have reviewed the triage vital signs and the nursing notes.  Pertinent labs & imaging results that were available during my care of the patient were reviewed by me and considered in my medical decision making (see chart for details).       Pt is a 87 y.o. male who presents for 1 day of respiratory symptoms. [place]amba is afebrile here without recent antipyretics. Satting well on room air. Overall pt is non-ill appearing, well hydrated, without respiratory distress. Pulmonary exam is unremarkable.  COVID testing obtained and is positive.  Child has never had COVID before.  Discussed antiviral therapy and he is interested in Paxlovid.  Reviewed his medication list and made appropriate medication adjustments.  Serum creatinine is normal.  Paxlovid sent to the pharmacy.  Prescribed Promethazine DM for cough, albuterol for bronchospasm, Atrovent nasal spray for nasal congestion.  Discussed symptomatic treatment.  Explained lack of efficacy of antibiotics in viral disease.  Typical duration of symptoms discussed.   Return and ED precautions given and voiced understanding. Discussed MDM, treatment plan and plan for follow-up with patient who agrees with plan.     Final Clinical Impressions(s) / UC Diagnoses   Final diagnoses:  COVID-19     Discharge Instructions      Your  test for COVID-19 was positive, meaning that you were infected with the novel coronavirus and could give the germ to others.  The recommendations suggest returning to normal activities when, for at least 24 hours, symptoms are improving overall, and if a fever was present, it has been gone without use of a fever-reducing  medication.  I prescribed Paxlovid which can cause symptoms to return after completing it.  Take a home antigen test to see, if your symptoms come back. Drug interactions: Take your amlodipine every other day.  Hold your Flomax as  finasteride.  If you start having urinary frequency, call your regular doctor.  I prescribed albuterol for chest tightness/shortness of breath.  Tessalon Perles/benzoate capsules are used for cough.  Ipratropium nasal spray for any nasal congestion. Paxlovid may cause you to have new symptoms.  Most people do really well with this however this is a possibility. You should wear a mask for the next 5 days to prevent the spread of disease. Please continue good preventive care measures, including:  frequent hand-washing, avoid touching your face, cover coughs/sneezes, stay out of crowds and keep a 6 foot distance from others.  Go to the nearest hospital emergency room if fever/cough/breathlessness are severe or illness seems like a threat to life.  If your were prescribed medication. Stop by the pharmacy to pick it up. You can take Tylenol and/or Ibuprofen as needed for fever reduction and pain relief.    For cough: honey 1/2 to 1 teaspoon (you can dilute the honey in water or another fluid).  You can also use guaifenesin and dextromethorphan for cough. You can use a humidifier for chest congestion and cough.  If you don't have a humidifier, you can sit in the bathroom with the hot shower running.      For sore throat: try warm salt water gargles, Mucinex sore throat cough drops or cepacol lozenges, throat spray, warm tea or water with lemon/honey, popsicles or ice, or OTC cold relief medicine for throat discomfort. You can also purchase chloraseptic spray at the pharmacy or dollar store.   For congestion: take a daily anti-histamine like Zyrtec, Claritin, and a oral decongestant, such as pseudoephedrine.  You can also use Flonase 1-2 sprays in each nostril daily. Afrin is also a good option, if you do not have high blood pressure.    It is important to stay hydrated: drink plenty of fluids (water, gatorade/powerade/pedialyte, juices, or teas) to keep your throat moisturized and help further relieve  irritation/discomfort.    Return or go to the Emergency Department if symptoms worsen or do not improve in the next few days      ED Prescriptions     Medication Sig Dispense Auth. Provider   nirmatrelvir & ritonavir (PAXLOVID, 300/100,) 20 x 150 MG & 10 x 100MG  TBPK Take 3 tablets by mouth 2 (two) times daily for 5 days. 30 tablet Jamison Soward, DO   ipratropium (ATROVENT) 0.06 % nasal spray Place 2 sprays into both nostrils 4 (four) times daily. 15 mL Elliana Bal, DO   benzonatate (TESSALON) 100 MG capsule Take 1 capsule (100 mg total) by mouth every 8 (eight) hours. 21 capsule Samin Milke, DO   albuterol (VENTOLIN HFA) 108 (90 Base) MCG/ACT inhaler Inhale 2 puffs into the lungs every 4 (four) hours as needed. 6.7 g Katha Cabal, DO      PDMP not reviewed this encounter.   Katha Cabal, DO 03/25/23 2013

## 2023-04-01 ENCOUNTER — Other Ambulatory Visit: Payer: Self-pay | Admitting: Family Medicine

## 2023-04-01 ENCOUNTER — Telehealth: Payer: Self-pay | Admitting: Family Medicine

## 2023-04-01 DIAGNOSIS — G4700 Insomnia, unspecified: Secondary | ICD-10-CM

## 2023-04-01 NOTE — Telephone Encounter (Signed)
Pt called back.  I told him the refill for trazadone was in the box for Dr. Sherrie Mustache but he is not in on Thursdays,  He said he can not sleep without it.  He wants to know if someone else will ok it.  CB#  [phone]

## 2023-04-02 NOTE — Telephone Encounter (Signed)
Prescription has been sent in  

## 2023-04-02 NOTE — Telephone Encounter (Signed)
Requested Prescriptions  Pending Prescriptions Disp Refills   traZODone (DESYREL) 100 MG tablet [Pharmacy Med Name: traZODone HCl 100 MG Oral Tablet] 30 tablet 0    Sig: TAKE 1 TABLET BY MOUTH AT BEDTIME     Psychiatry: Antidepressants - Serotonin Modulator Passed - 04/01/2023  3:27 PM      Passed - Valid encounter within last 6 months    Recent Outpatient Visits           3 months ago Diabetes mellitus without complication [facility])   [place] [facility] Malva Limes, MD   8 months ago Diabetes mellitus without complication [facility])   [place] [facility] Malva Limes, MD   10 months ago Transient neurologic deficit   [facility] L[facility], MD   11 months ago Renal artery stenosis [facility])   [facility] Malva Limes, MD   11 months ago Insomnia, unspecified type   [facility] Malva Limes, MD       Future Appointments             In 3 weeks Fisher, Demetrios Isaacs, MD [facility]   In 1 month Richardo Hanks, Laurette Schimke, MD [facility] [place]

## 2023-04-12 DIAGNOSIS — M25561 Pain in right knee: Secondary | ICD-10-CM | POA: Diagnosis not present

## 2023-04-12 DIAGNOSIS — R262 Difficulty in walking, not elsewhere classified: Secondary | ICD-10-CM | POA: Diagnosis not present

## 2023-04-12 DIAGNOSIS — R296 Repeated falls: Secondary | ICD-10-CM | POA: Diagnosis not present

## 2023-04-12 DIAGNOSIS — M25562 Pain in left knee: Secondary | ICD-10-CM | POA: Diagnosis not present

## 2023-04-12 DIAGNOSIS — M25672 Stiffness of left ankle, not elsewhere classified: Secondary | ICD-10-CM | POA: Diagnosis not present

## 2023-04-12 DIAGNOSIS — M25671 Stiffness of right ankle, not elsewhere classified: Secondary | ICD-10-CM | POA: Diagnosis not present

## 2023-04-13 ENCOUNTER — Other Ambulatory Visit: Payer: Self-pay | Admitting: Family Medicine

## 2023-04-13 DIAGNOSIS — E039 Hypothyroidism, unspecified: Secondary | ICD-10-CM

## 2023-04-13 NOTE — Telephone Encounter (Signed)
Medication Refill - Medication: levothyroxine (SYNTHROID) 75 MCG tablet   Has the patient contacted their pharmacy? yes (Agent: If no, request that the patient contact the pharmacy for the refill. If patient does not wish to contact the pharmacy document the reason why and proceed with request.) (Agent: If yes, when and what did the pharmacy advise?)contact pcp  Preferred Pharmacy (with phone number or street name):  [facility] [address] [place] - [address] Phone: [phone]  Fax: [phone]     Has the patient been seen for an appointment in the last year OR does the patient have an upcoming appointment? yes  Agent: Please be advised that RX refills may take up to 3 business days. We ask that you follow-up with your pharmacy.

## 2023-04-14 MED ORDER — LEVOTHYROXINE SODIUM 75 MCG PO TABS
75.0000 ug | ORAL_TABLET | Freq: Every day | ORAL | 0 refills | Status: DC
Start: 2023-04-14 — End: 2023-07-05

## 2023-04-14 NOTE — Telephone Encounter (Signed)
Requested Prescriptions  Pending Prescriptions Disp Refills   levothyroxine (SYNTHROID) 75 MCG tablet 90 tablet 0    Sig: Take 1 tablet (75 mcg total) by mouth daily before breakfast.     Endocrinology:  Hypothyroid Agents Passed - 04/13/2023  1:45 PM      Passed - TSH in normal range and within 360 days    TSH  Date Value Ref Range Status  04/29/2022 3.410 0.450 - 4.500 uIU/mL Final         Passed - Valid encounter within last 12 months    Recent Outpatient Visits           4 months ago Diabetes mellitus without complication [facility])   [place] [facility] Malva Limes, MD   8 months ago Diabetes mellitus without complication [facility])   [place] [facility] Malva Limes, MD   11 months ago Transient neurologic deficit   [facility] Malva Limes, MD   11 months ago Renal artery stenosis [facility])   [place] [facility] Malva Limes, MD   11 months ago Insomnia, unspecified type   [facility] Malva Limes, MD       Future Appointments             In 1 week Fisher, Demetrios Isaacs, MD [facility]   In 3 weeks Richardo Hanks, Laurette Schimke, MD [facility] [place]

## 2023-04-16 DIAGNOSIS — R296 Repeated falls: Secondary | ICD-10-CM | POA: Diagnosis not present

## 2023-04-16 DIAGNOSIS — M25671 Stiffness of right ankle, not elsewhere classified: Secondary | ICD-10-CM | POA: Diagnosis not present

## 2023-04-16 DIAGNOSIS — M25562 Pain in left knee: Secondary | ICD-10-CM | POA: Diagnosis not present

## 2023-04-16 DIAGNOSIS — R262 Difficulty in walking, not elsewhere classified: Secondary | ICD-10-CM | POA: Diagnosis not present

## 2023-04-16 DIAGNOSIS — M25561 Pain in right knee: Secondary | ICD-10-CM | POA: Diagnosis not present

## 2023-04-16 DIAGNOSIS — M25672 Stiffness of left ankle, not elsewhere classified: Secondary | ICD-10-CM | POA: Diagnosis not present

## 2023-04-20 DIAGNOSIS — M25562 Pain in left knee: Secondary | ICD-10-CM | POA: Diagnosis not present

## 2023-04-20 DIAGNOSIS — M25671 Stiffness of right ankle, not elsewhere classified: Secondary | ICD-10-CM | POA: Diagnosis not present

## 2023-04-20 DIAGNOSIS — M25561 Pain in right knee: Secondary | ICD-10-CM | POA: Diagnosis not present

## 2023-04-20 DIAGNOSIS — R296 Repeated falls: Secondary | ICD-10-CM | POA: Diagnosis not present

## 2023-04-20 DIAGNOSIS — M25672 Stiffness of left ankle, not elsewhere classified: Secondary | ICD-10-CM | POA: Diagnosis not present

## 2023-04-20 DIAGNOSIS — R262 Difficulty in walking, not elsewhere classified: Secondary | ICD-10-CM | POA: Diagnosis not present

## 2023-04-23 ENCOUNTER — Ambulatory Visit (INDEPENDENT_AMBULATORY_CARE_PROVIDER_SITE_OTHER): Payer: Medicare Other | Admitting: Family Medicine

## 2023-04-23 ENCOUNTER — Ambulatory Visit
Admission: RE | Admit: 2023-04-23 | Discharge: 2023-04-23 | Disposition: A | Discharge status: Home / Self Care | Payer: Medicare Other | Source: Ambulatory Visit | Attending: Family Medicine | Admitting: Family Medicine

## 2023-04-23 ENCOUNTER — Ambulatory Visit
Admission: RE | Admit: 2023-04-23 | Discharge: 2023-04-23 | Disposition: A | Discharge status: Home / Self Care | Payer: Medicare Other | Attending: Family Medicine | Admitting: Family Medicine

## 2023-04-23 VITALS — BP 160/80 | HR 66 | Ht 69.5 in | Wt 182.9 lb

## 2023-04-23 DIAGNOSIS — E119 Type 2 diabetes mellitus without complications: Secondary | ICD-10-CM | POA: Diagnosis not present

## 2023-04-23 DIAGNOSIS — E039 Hypothyroidism, unspecified: Secondary | ICD-10-CM

## 2023-04-23 DIAGNOSIS — M25561 Pain in right knee: Secondary | ICD-10-CM | POA: Diagnosis not present

## 2023-04-23 DIAGNOSIS — M438X6 Other specified deforming dorsopathies, lumbar region: Secondary | ICD-10-CM | POA: Diagnosis not present

## 2023-04-23 DIAGNOSIS — I10 Essential (primary) hypertension: Secondary | ICD-10-CM

## 2023-04-23 DIAGNOSIS — M545 Low back pain, unspecified: Secondary | ICD-10-CM | POA: Insufficient documentation

## 2023-04-23 DIAGNOSIS — E781 Pure hyperglyceridemia: Secondary | ICD-10-CM

## 2023-04-23 DIAGNOSIS — I701 Atherosclerosis of renal artery: Secondary | ICD-10-CM

## 2023-04-23 DIAGNOSIS — M25672 Stiffness of left ankle, not elsewhere classified: Secondary | ICD-10-CM | POA: Diagnosis not present

## 2023-04-23 DIAGNOSIS — M4316 Spondylolisthesis, lumbar region: Secondary | ICD-10-CM | POA: Diagnosis not present

## 2023-04-23 DIAGNOSIS — M25671 Stiffness of right ankle, not elsewhere classified: Secondary | ICD-10-CM | POA: Diagnosis not present

## 2023-04-23 DIAGNOSIS — M25562 Pain in left knee: Secondary | ICD-10-CM | POA: Diagnosis not present

## 2023-04-23 DIAGNOSIS — R262 Difficulty in walking, not elsewhere classified: Secondary | ICD-10-CM | POA: Diagnosis not present

## 2023-04-23 DIAGNOSIS — M47816 Spondylosis without myelopathy or radiculopathy, lumbar region: Secondary | ICD-10-CM | POA: Diagnosis not present

## 2023-04-23 DIAGNOSIS — R296 Repeated falls: Secondary | ICD-10-CM | POA: Diagnosis not present

## 2023-04-23 LAB — POCT GLYCOSYLATED HEMOGLOBIN (HGB A1C)
Est. average glucose Bld gHb Est-mCnc: 137
Hemoglobin A1C: 6.4 % — AB (ref 4.0–5.6)

## 2023-04-23 NOTE — Patient Instructions (Signed)
Please review the attached list of medications and notify my office if there are any errors.   Go to the [facility] on [address] for low back Xray  

## 2023-04-24 LAB — CBC
Hematocrit: 39 % (ref 37.5–51.0)
Hemoglobin: 13.6 g/dL (ref 13.0–17.7)
MCH: 30.8 pg (ref 26.6–33.0)
MCHC: 34.9 g/dL (ref 31.5–35.7)
MCV: 88 fL (ref 79–97)
Platelets: 287 10*3/uL (ref 150–450)
RBC: 4.41 x10E6/uL (ref 4.14–5.80)
RDW: 12.6 % (ref 11.6–15.4)
WBC: 8.1 10*3/uL (ref 3.4–10.8)

## 2023-04-24 LAB — LIPID PANEL
Chol/HDL Ratio: 2.5 ratio (ref 0.0–5.0)
Cholesterol, Total: 124 mg/dL (ref 100–199)
HDL: 50 mg/dL (ref >39)
LDL Chol Calc (NIH): 51 mg/dL (ref 0–99)
Triglycerides: 129 mg/dL (ref 0–149)
VLDL Cholesterol Cal: 23 mg/dL (ref 5–40)

## 2023-04-24 LAB — COMPREHENSIVE METABOLIC PANEL
ALT: 25 IU/L (ref 0–44)
AST: 29 IU/L (ref 0–40)
Albumin: 4.3 g/dL (ref 3.7–4.7)
Alkaline Phosphatase: 58 IU/L (ref 44–121)
BUN/Creatinine Ratio: 19 (ref 10–24)
BUN: 20 mg/dL (ref 8–27)
Bilirubin Total: 0.6 mg/dL (ref 0.0–1.2)
CO2: 26 mmol/L (ref 20–29)
Calcium: 10.5 mg/dL — ABNORMAL HIGH (ref 8.6–10.2)
Chloride: 99 mmol/L (ref 96–106)
Creatinine, Ser: 1.04 mg/dL (ref 0.76–1.27)
Globulin, Total: 2.8 g/dL (ref 1.5–4.5)
Glucose: 96 mg/dL (ref 70–99)
Potassium: 3.8 mmol/L (ref 3.5–5.2)
Sodium: 140 mmol/L (ref 134–144)
Total Protein: 7.1 g/dL (ref 6.0–8.5)
eGFR: 70 mL/min/{1.73_m2} (ref >59)

## 2023-04-24 LAB — T4, FREE: Free T4: 1.23 ng/dL (ref 0.82–1.77)

## 2023-04-24 LAB — TSH: TSH: 5.44 u[IU]/mL — ABNORMAL HIGH (ref 0.450–4.500)

## 2023-04-26 NOTE — Progress Notes (Signed)
Established patient visit   Patient: Ernest James.   DOB: [birth year]   87 y.o. Male  MRN: [id number] Visit Date: 04/23/2023  Today's healthcare provider: Mila Merry, MD   Chief Complaint  Patient presents with   Medical Management of Chronic Issues   Subjective    HPI  Discussed the use of AI scribe software for clinical note transcription with the patient, who gave verbal consent to proceed.  History of Present Illness   Patient present for routine follow up of chronic problems including diabetes, hypertension, hypertriglyceridemia, and hypothyroidism. Recently recovered from a mild case of COVID-19, which started on July 18th. The patient reported minor symptoms during the illness and has since fully recovered with no residual symptoms. The patient's diabetes appears to be well-controlled, with a recent HbA1c of 6.4, slightly up from the previous value of 6.1.  The patient has been attending physical therapy for balance issues. However, he has been experiencing persistent back pain, which he initially thought was muscular due to a decrease in pain during a period of reduced exercise. The pain sometimes radiates down the back of his leg, which can be particularly sharp during certain exercises.  The patient also reported persistent leg swelling following a carotid artery stent procedure. He has not experienced any chest pains, heart flutters, or shortness of breath. The patient's medications include Plavix and aspirin, which were prescribed following the stent procedure. He was previously on Meloxicam for suspected arthritis, but this was discontinued due to potential interactions with his current medications.  The patient's sleep has been slightly disturbed, but he does not report any significant issues with anxiety. He has not reported any new medications or changes in his medication regimen.       Medications: Outpatient Medications Prior to Visit   Medication Sig   acetaminophen (TYLENOL) 500 MG tablet Take 500 mg by mouth as needed for mild pain.   acetaminophen (TYLENOL) 650 MG CR tablet Take 650 mg by mouth as needed for pain.   albuterol (VENTOLIN HFA) 108 (90 Base) MCG/ACT inhaler Inhale 2 puffs into the lungs every 4 (four) hours as needed.   amLODipine (NORVASC) 5 MG tablet Take 1 tablet by mouth once daily   aspirin EC 81 MG tablet Take 81 mg by mouth daily.   atorvastatin (LIPITOR) 40 MG tablet Take 1 tablet by mouth once daily   benzonatate (TESSALON) 100 MG capsule Take 1 capsule (100 mg total) by mouth every 8 (eight) hours.   Cholecalciferol (VITAMIN D3) 20 MCG TABS Take 1 tablet by mouth daily.    clopidogrel (PLAVIX) 75 MG tablet Take 1 tablet by mouth once daily   Cyanocobalamin (VITAMIN B12) 1000 MCG TBCR Take 1 tablet by mouth daily.   hydrochlorothiazide (HYDRODIURIL) 25 MG tablet Take 1 tablet by mouth once daily   ibuprofen (ADVIL) 200 MG tablet Take 200 mg by mouth as needed.   ipratropium (ATROVENT) 0.06 % nasal spray Place 2 sprays into both nostrils 4 (four) times daily.   levothyroxine (SYNTHROID) 75 MCG tablet Take 1 tablet (75 mcg total) by mouth daily before breakfast.   losartan (COZAAR) 50 MG tablet Take 1 tablet (50 mg total) by mouth daily.   loteprednol (LOTEMAX) 0.5 % ophthalmic suspension 1 drop 2 (two) times daily.   metFORMIN (GLUCOPHAGE-XR) 500 MG 24 hr tablet Take 1 tablet by mouth once daily with breakfast   polyethylene glycol powder (GLYCOLAX/MIRALAX) 17 GM/SCOOP powder Take 17 g by mouth  daily as needed for mild constipation.   tamsulosin (FLOMAX) 0.4 MG CAPS capsule Take 1 capsule (0.4 mg total) by mouth daily after supper.   traZODone (DESYREL) 100 MG tablet TAKE 1 TABLET BY MOUTH AT BEDTIME   Vitamin D, Ergocalciferol, (DRISDOL) 1.25 MG (50000 UNIT) CAPS capsule Take 50,000 Units by mouth every 7 (seven) days.   finasteride (PROSCAR) 5 MG tablet Take 1 tablet (5 mg total) by mouth daily.  (Patient not taking: Reported on 04/23/2023)   No facility-administered medications prior to visit.    Review of Systems  Constitutional:  Negative for appetite change, chills and fever.  Respiratory:  Negative for chest tightness, shortness of breath and wheezing.   Cardiovascular:  Negative for chest pain and palpitations.  Gastrointestinal:  Negative for abdominal pain, nausea and vomiting.      Objective    BP (!) 160/80 (BP Location: Right Arm, Patient Position: Sitting, Cuff Size: Normal)   Pulse 66   Ht 5' 9.5" (1.765 m)   Wt 182 lb 14.4 oz (83 kg)   BMI 26.62 kg/m   Physical Exam   General: Appearance:    Well developed, well nourished male in no acute distress  Eyes:    PERRL, conjunctiva/corneas clear, EOM's intact       Lungs:     Clear to auscultation bilaterally, respirations unlabored  Heart:    Normal heart rate. Normal rhythm. No murmurs, rubs, or gallops.    MS:   All extremities are intact.    Neurologic:   Awake, alert, oriented x 3. No apparent focal neurological defect.        Lab Results  Component Value Date   HGBA1C 6.4 (A) 04/23/2023     Assessment & Plan     1. Diabetes mellitus without complication (HCC) Well controlled.  Continue current medications.    2. Primary hypertension Well controlled.  Continue current medications.    3. Renal artery stenosis (HCC) Asymptomatic. Compliant with medication.  Continue aggressive risk factor modification.    4. Hypothyroidism, unspecified type  - TSH - T4, free  5. Pure hyperglyceridemia He is tolerating atorvastatin well with no adverse effects.    - CBC - Comprehensive metabolic panel - Lipid panel  6. Low back pain without sciatica, unspecified back pain laterality, unspecified chronicity  - DG Lumbar Spine Complete; Future          Mila Merry, MD  [facility] [phone] (phone) [phone] (fax)  [facility]

## 2023-04-27 DIAGNOSIS — M25672 Stiffness of left ankle, not elsewhere classified: Secondary | ICD-10-CM | POA: Diagnosis not present

## 2023-04-27 DIAGNOSIS — M25671 Stiffness of right ankle, not elsewhere classified: Secondary | ICD-10-CM | POA: Diagnosis not present

## 2023-04-27 DIAGNOSIS — R262 Difficulty in walking, not elsewhere classified: Secondary | ICD-10-CM | POA: Diagnosis not present

## 2023-04-27 DIAGNOSIS — R296 Repeated falls: Secondary | ICD-10-CM | POA: Diagnosis not present

## 2023-04-27 DIAGNOSIS — M25561 Pain in right knee: Secondary | ICD-10-CM | POA: Diagnosis not present

## 2023-04-27 DIAGNOSIS — M25562 Pain in left knee: Secondary | ICD-10-CM | POA: Diagnosis not present

## 2023-04-30 ENCOUNTER — Telehealth: Payer: Self-pay

## 2023-04-30 NOTE — Telephone Encounter (Signed)
Copied from CRM [id number]. Topic: General - Call Back - No Documentation >> Apr 30, 2023 10:11 AM Macon Large wrote: Reason for CRM: Pt stated that he was returning a missed call that he had from the office. Cb# [phone]

## 2023-04-30 NOTE — Telephone Encounter (Signed)
I had called patient 08/19 but patient returned call and lab results were given. I do not see in his chart any other documentation on why they called.

## 2023-05-03 DIAGNOSIS — M2041 Other hammer toe(s) (acquired), right foot: Secondary | ICD-10-CM | POA: Diagnosis not present

## 2023-05-03 DIAGNOSIS — B351 Tinea unguium: Secondary | ICD-10-CM | POA: Diagnosis not present

## 2023-05-03 DIAGNOSIS — M79675 Pain in left toe(s): Secondary | ICD-10-CM | POA: Diagnosis not present

## 2023-05-03 DIAGNOSIS — M79674 Pain in right toe(s): Secondary | ICD-10-CM | POA: Diagnosis not present

## 2023-05-03 DIAGNOSIS — E1142 Type 2 diabetes mellitus with diabetic polyneuropathy: Secondary | ICD-10-CM | POA: Diagnosis not present

## 2023-05-03 DIAGNOSIS — M2042 Other hammer toe(s) (acquired), left foot: Secondary | ICD-10-CM | POA: Diagnosis not present

## 2023-05-04 DIAGNOSIS — R296 Repeated falls: Secondary | ICD-10-CM | POA: Diagnosis not present

## 2023-05-04 DIAGNOSIS — M25671 Stiffness of right ankle, not elsewhere classified: Secondary | ICD-10-CM | POA: Diagnosis not present

## 2023-05-04 DIAGNOSIS — R262 Difficulty in walking, not elsewhere classified: Secondary | ICD-10-CM | POA: Diagnosis not present

## 2023-05-04 DIAGNOSIS — M25561 Pain in right knee: Secondary | ICD-10-CM | POA: Diagnosis not present

## 2023-05-04 DIAGNOSIS — M25562 Pain in left knee: Secondary | ICD-10-CM | POA: Diagnosis not present

## 2023-05-04 DIAGNOSIS — M25672 Stiffness of left ankle, not elsewhere classified: Secondary | ICD-10-CM | POA: Diagnosis not present

## 2023-05-05 ENCOUNTER — Ambulatory Visit: Payer: Medicare Other | Admitting: Urology

## 2023-05-06 ENCOUNTER — Telehealth: Payer: Self-pay | Admitting: Cardiology

## 2023-05-06 NOTE — Telephone Encounter (Signed)
Called and spoke with patient and the patient's daughter said that the patient is seeing [facility] now.

## 2023-05-07 DIAGNOSIS — M25561 Pain in right knee: Secondary | ICD-10-CM | POA: Diagnosis not present

## 2023-05-07 DIAGNOSIS — M25562 Pain in left knee: Secondary | ICD-10-CM | POA: Diagnosis not present

## 2023-05-07 DIAGNOSIS — R296 Repeated falls: Secondary | ICD-10-CM | POA: Diagnosis not present

## 2023-05-07 DIAGNOSIS — M25672 Stiffness of left ankle, not elsewhere classified: Secondary | ICD-10-CM | POA: Diagnosis not present

## 2023-05-07 DIAGNOSIS — M25671 Stiffness of right ankle, not elsewhere classified: Secondary | ICD-10-CM | POA: Diagnosis not present

## 2023-05-07 DIAGNOSIS — R262 Difficulty in walking, not elsewhere classified: Secondary | ICD-10-CM | POA: Diagnosis not present

## 2023-05-11 ENCOUNTER — Other Ambulatory Visit: Payer: Self-pay | Admitting: Family Medicine

## 2023-05-11 DIAGNOSIS — E782 Mixed hyperlipidemia: Secondary | ICD-10-CM

## 2023-05-12 DIAGNOSIS — M25562 Pain in left knee: Secondary | ICD-10-CM | POA: Diagnosis not present

## 2023-05-12 DIAGNOSIS — R296 Repeated falls: Secondary | ICD-10-CM | POA: Diagnosis not present

## 2023-05-12 DIAGNOSIS — M25671 Stiffness of right ankle, not elsewhere classified: Secondary | ICD-10-CM | POA: Diagnosis not present

## 2023-05-12 DIAGNOSIS — R262 Difficulty in walking, not elsewhere classified: Secondary | ICD-10-CM | POA: Diagnosis not present

## 2023-05-12 DIAGNOSIS — M25672 Stiffness of left ankle, not elsewhere classified: Secondary | ICD-10-CM | POA: Diagnosis not present

## 2023-05-12 DIAGNOSIS — M25561 Pain in right knee: Secondary | ICD-10-CM | POA: Diagnosis not present

## 2023-05-13 NOTE — Telephone Encounter (Signed)
Requested Prescriptions  Pending Prescriptions Disp Refills   atorvastatin (LIPITOR) 40 MG tablet [Pharmacy Med Name: Atorvastatin Calcium 40 MG Oral Tablet] 90 tablet 3    Sig: Take 1 tablet by mouth once daily     Cardiovascular:  Antilipid - Statins Failed - 05/11/2023  6:35 PM      Failed - Lipid Panel in normal range within the last 12 months    Cholesterol, Total  Date Value Ref Range Status  04/23/2023 124 100 - 199 mg/dL Final   LDL Chol Calc (NIH)  Date Value Ref Range Status  04/23/2023 51 0 - 99 mg/dL Final   HDL  Date Value Ref Range Status  04/23/2023 50 >39 mg/dL Final   Triglycerides  Date Value Ref Range Status  04/23/2023 129 0 - 149 mg/dL Final         Passed - Patient is not pregnant      Passed - Valid encounter within last 12 months    Recent Outpatient Visits           2 weeks ago Diabetes mellitus without complication (HCC)   [place] [facility] Malva Limes, MD   5 months ago Diabetes mellitus without complication [facility])   [place] [facility] Malva Limes, MD   9 months ago Diabetes mellitus without complication [facility])   [place] [facility] Malva Limes, MD   12 months ago Transient neurologic deficit   [facility] Malva Limes, MD   1 year ago Renal artery stenosis [facility])   [place] [facility] Malva Limes, MD

## 2023-05-14 DIAGNOSIS — M25671 Stiffness of right ankle, not elsewhere classified: Secondary | ICD-10-CM | POA: Diagnosis not present

## 2023-05-14 DIAGNOSIS — R262 Difficulty in walking, not elsewhere classified: Secondary | ICD-10-CM | POA: Diagnosis not present

## 2023-05-14 DIAGNOSIS — M25561 Pain in right knee: Secondary | ICD-10-CM | POA: Diagnosis not present

## 2023-05-14 DIAGNOSIS — M25672 Stiffness of left ankle, not elsewhere classified: Secondary | ICD-10-CM | POA: Diagnosis not present

## 2023-05-14 DIAGNOSIS — R296 Repeated falls: Secondary | ICD-10-CM | POA: Diagnosis not present

## 2023-05-14 DIAGNOSIS — M25562 Pain in left knee: Secondary | ICD-10-CM | POA: Diagnosis not present

## 2023-05-18 DIAGNOSIS — M25562 Pain in left knee: Secondary | ICD-10-CM | POA: Diagnosis not present

## 2023-05-18 DIAGNOSIS — M25561 Pain in right knee: Secondary | ICD-10-CM | POA: Diagnosis not present

## 2023-05-18 DIAGNOSIS — R262 Difficulty in walking, not elsewhere classified: Secondary | ICD-10-CM | POA: Diagnosis not present

## 2023-05-18 DIAGNOSIS — M25671 Stiffness of right ankle, not elsewhere classified: Secondary | ICD-10-CM | POA: Diagnosis not present

## 2023-05-18 DIAGNOSIS — M25672 Stiffness of left ankle, not elsewhere classified: Secondary | ICD-10-CM | POA: Diagnosis not present

## 2023-05-18 DIAGNOSIS — R296 Repeated falls: Secondary | ICD-10-CM | POA: Diagnosis not present

## 2023-05-21 DIAGNOSIS — R262 Difficulty in walking, not elsewhere classified: Secondary | ICD-10-CM | POA: Diagnosis not present

## 2023-05-21 DIAGNOSIS — M25672 Stiffness of left ankle, not elsewhere classified: Secondary | ICD-10-CM | POA: Diagnosis not present

## 2023-05-21 DIAGNOSIS — M25562 Pain in left knee: Secondary | ICD-10-CM | POA: Diagnosis not present

## 2023-05-21 DIAGNOSIS — M25561 Pain in right knee: Secondary | ICD-10-CM | POA: Diagnosis not present

## 2023-05-21 DIAGNOSIS — M25671 Stiffness of right ankle, not elsewhere classified: Secondary | ICD-10-CM | POA: Diagnosis not present

## 2023-05-21 DIAGNOSIS — R296 Repeated falls: Secondary | ICD-10-CM | POA: Diagnosis not present

## 2023-05-25 DIAGNOSIS — R262 Difficulty in walking, not elsewhere classified: Secondary | ICD-10-CM | POA: Diagnosis not present

## 2023-05-25 DIAGNOSIS — R296 Repeated falls: Secondary | ICD-10-CM | POA: Diagnosis not present

## 2023-05-25 DIAGNOSIS — M25671 Stiffness of right ankle, not elsewhere classified: Secondary | ICD-10-CM | POA: Diagnosis not present

## 2023-05-25 DIAGNOSIS — M25561 Pain in right knee: Secondary | ICD-10-CM | POA: Diagnosis not present

## 2023-05-25 DIAGNOSIS — M25672 Stiffness of left ankle, not elsewhere classified: Secondary | ICD-10-CM | POA: Diagnosis not present

## 2023-05-25 DIAGNOSIS — M25562 Pain in left knee: Secondary | ICD-10-CM | POA: Diagnosis not present

## 2023-05-28 DIAGNOSIS — R296 Repeated falls: Secondary | ICD-10-CM | POA: Diagnosis not present

## 2023-05-28 DIAGNOSIS — M25672 Stiffness of left ankle, not elsewhere classified: Secondary | ICD-10-CM | POA: Diagnosis not present

## 2023-05-28 DIAGNOSIS — R262 Difficulty in walking, not elsewhere classified: Secondary | ICD-10-CM | POA: Diagnosis not present

## 2023-05-28 DIAGNOSIS — M25671 Stiffness of right ankle, not elsewhere classified: Secondary | ICD-10-CM | POA: Diagnosis not present

## 2023-05-28 DIAGNOSIS — M25561 Pain in right knee: Secondary | ICD-10-CM | POA: Diagnosis not present

## 2023-05-28 DIAGNOSIS — M25562 Pain in left knee: Secondary | ICD-10-CM | POA: Diagnosis not present

## 2023-05-29 ENCOUNTER — Other Ambulatory Visit: Payer: Self-pay | Admitting: Family Medicine

## 2023-05-29 DIAGNOSIS — G4700 Insomnia, unspecified: Secondary | ICD-10-CM

## 2023-06-01 DIAGNOSIS — M25561 Pain in right knee: Secondary | ICD-10-CM | POA: Diagnosis not present

## 2023-06-01 DIAGNOSIS — M25672 Stiffness of left ankle, not elsewhere classified: Secondary | ICD-10-CM | POA: Diagnosis not present

## 2023-06-01 DIAGNOSIS — R296 Repeated falls: Secondary | ICD-10-CM | POA: Diagnosis not present

## 2023-06-01 DIAGNOSIS — R262 Difficulty in walking, not elsewhere classified: Secondary | ICD-10-CM | POA: Diagnosis not present

## 2023-06-01 DIAGNOSIS — M25671 Stiffness of right ankle, not elsewhere classified: Secondary | ICD-10-CM | POA: Diagnosis not present

## 2023-06-01 DIAGNOSIS — M25562 Pain in left knee: Secondary | ICD-10-CM | POA: Diagnosis not present

## 2023-06-01 NOTE — Telephone Encounter (Signed)
Requested Prescriptions  Pending Prescriptions Disp Refills   traZODone (DESYREL) 100 MG tablet [Pharmacy Med Name: traZODone HCl 100 MG Oral Tablet] 90 tablet 1    Sig: TAKE 1 TABLET BY MOUTH AT BEDTIME     Psychiatry: Antidepressants - Serotonin Modulator Passed - 05/29/2023  2:05 PM      Passed - Valid encounter within last 6 months    Recent Outpatient Visits           1 month ago Diabetes mellitus without complication [facility])   [place] [facility] Malva Limes, MD   5 months ago Diabetes mellitus without complication [facility])   [facility] Malva Limes, MD   10 months ago Diabetes mellitus without complication [facility])   [place] [facility] Malva Limes, MD   1 year ago Transient neurologic deficit   [facility] Malva Limes, MD   1 year ago Renal artery stenosis [facility])   [place] [facility] Malva Limes, MD

## 2023-06-04 DIAGNOSIS — M25672 Stiffness of left ankle, not elsewhere classified: Secondary | ICD-10-CM | POA: Diagnosis not present

## 2023-06-04 DIAGNOSIS — R262 Difficulty in walking, not elsewhere classified: Secondary | ICD-10-CM | POA: Diagnosis not present

## 2023-06-04 DIAGNOSIS — R296 Repeated falls: Secondary | ICD-10-CM | POA: Diagnosis not present

## 2023-06-04 DIAGNOSIS — M25562 Pain in left knee: Secondary | ICD-10-CM | POA: Diagnosis not present

## 2023-06-04 DIAGNOSIS — M25671 Stiffness of right ankle, not elsewhere classified: Secondary | ICD-10-CM | POA: Diagnosis not present

## 2023-06-04 DIAGNOSIS — M25561 Pain in right knee: Secondary | ICD-10-CM | POA: Diagnosis not present

## 2023-06-08 DIAGNOSIS — R296 Repeated falls: Secondary | ICD-10-CM | POA: Diagnosis not present

## 2023-06-08 DIAGNOSIS — M25672 Stiffness of left ankle, not elsewhere classified: Secondary | ICD-10-CM | POA: Diagnosis not present

## 2023-06-08 DIAGNOSIS — R262 Difficulty in walking, not elsewhere classified: Secondary | ICD-10-CM | POA: Diagnosis not present

## 2023-06-08 DIAGNOSIS — M25671 Stiffness of right ankle, not elsewhere classified: Secondary | ICD-10-CM | POA: Diagnosis not present

## 2023-06-08 DIAGNOSIS — M25562 Pain in left knee: Secondary | ICD-10-CM | POA: Diagnosis not present

## 2023-06-08 DIAGNOSIS — M25561 Pain in right knee: Secondary | ICD-10-CM | POA: Diagnosis not present

## 2023-06-14 DIAGNOSIS — M25561 Pain in right knee: Secondary | ICD-10-CM | POA: Diagnosis not present

## 2023-06-14 DIAGNOSIS — M25672 Stiffness of left ankle, not elsewhere classified: Secondary | ICD-10-CM | POA: Diagnosis not present

## 2023-06-14 DIAGNOSIS — M25562 Pain in left knee: Secondary | ICD-10-CM | POA: Diagnosis not present

## 2023-06-14 DIAGNOSIS — M25671 Stiffness of right ankle, not elsewhere classified: Secondary | ICD-10-CM | POA: Diagnosis not present

## 2023-06-14 DIAGNOSIS — R262 Difficulty in walking, not elsewhere classified: Secondary | ICD-10-CM | POA: Diagnosis not present

## 2023-06-14 DIAGNOSIS — R296 Repeated falls: Secondary | ICD-10-CM | POA: Diagnosis not present

## 2023-06-17 DIAGNOSIS — H2513 Age-related nuclear cataract, bilateral: Secondary | ICD-10-CM | POA: Diagnosis not present

## 2023-06-17 DIAGNOSIS — E119 Type 2 diabetes mellitus without complications: Secondary | ICD-10-CM | POA: Diagnosis not present

## 2023-07-02 ENCOUNTER — Other Ambulatory Visit: Payer: Self-pay | Admitting: Family Medicine

## 2023-07-02 DIAGNOSIS — E039 Hypothyroidism, unspecified: Secondary | ICD-10-CM

## 2023-07-05 DIAGNOSIS — H2512 Age-related nuclear cataract, left eye: Secondary | ICD-10-CM | POA: Diagnosis not present

## 2023-07-08 DIAGNOSIS — Z23 Encounter for immunization: Secondary | ICD-10-CM | POA: Diagnosis not present

## 2023-07-21 ENCOUNTER — Encounter: Payer: Self-pay | Admitting: Ophthalmology

## 2023-07-23 ENCOUNTER — Other Ambulatory Visit (INDEPENDENT_AMBULATORY_CARE_PROVIDER_SITE_OTHER): Payer: Self-pay | Admitting: Vascular Surgery

## 2023-07-23 DIAGNOSIS — I6523 Occlusion and stenosis of bilateral carotid arteries: Secondary | ICD-10-CM

## 2023-07-26 NOTE — Discharge Instructions (Signed)

## 2023-07-27 ENCOUNTER — Encounter
Admission: RE | Disposition: A | Discharge status: Home / Self Care | Payer: Self-pay | Source: Home / Self Care | Attending: Ophthalmology

## 2023-07-27 ENCOUNTER — Other Ambulatory Visit: Payer: Self-pay

## 2023-07-27 ENCOUNTER — Ambulatory Visit
Admission: RE | Admit: 2023-07-27 | Discharge: 2023-07-27 | Disposition: A | Discharge status: Home / Self Care | Payer: Medicare Other | Attending: Ophthalmology | Admitting: Ophthalmology

## 2023-07-27 ENCOUNTER — Encounter: Payer: Self-pay | Admitting: Ophthalmology

## 2023-07-27 ENCOUNTER — Ambulatory Visit: Payer: Medicare Other | Admitting: Anesthesiology

## 2023-07-27 DIAGNOSIS — Z8616 Personal history of COVID-19: Secondary | ICD-10-CM | POA: Diagnosis not present

## 2023-07-27 DIAGNOSIS — I2510 Atherosclerotic heart disease of native coronary artery without angina pectoris: Secondary | ICD-10-CM | POA: Insufficient documentation

## 2023-07-27 DIAGNOSIS — H2512 Age-related nuclear cataract, left eye: Secondary | ICD-10-CM | POA: Insufficient documentation

## 2023-07-27 DIAGNOSIS — I10 Essential (primary) hypertension: Secondary | ICD-10-CM | POA: Insufficient documentation

## 2023-07-27 DIAGNOSIS — E1136 Type 2 diabetes mellitus with diabetic cataract: Secondary | ICD-10-CM | POA: Diagnosis not present

## 2023-07-27 DIAGNOSIS — Z87891 Personal history of nicotine dependence: Secondary | ICD-10-CM | POA: Insufficient documentation

## 2023-07-27 DIAGNOSIS — G4730 Sleep apnea, unspecified: Secondary | ICD-10-CM | POA: Insufficient documentation

## 2023-07-27 DIAGNOSIS — Z8673 Personal history of transient ischemic attack (TIA), and cerebral infarction without residual deficits: Secondary | ICD-10-CM | POA: Insufficient documentation

## 2023-07-27 DIAGNOSIS — Z7984 Long term (current) use of oral hypoglycemic drugs: Secondary | ICD-10-CM | POA: Diagnosis not present

## 2023-07-27 HISTORY — DX: Nonrheumatic mitral (valve) insufficiency: I34.0

## 2023-07-27 HISTORY — DX: Hypothyroidism, unspecified: E03.9

## 2023-07-27 HISTORY — DX: Presence of external hearing-aid: Z97.4

## 2023-07-27 HISTORY — DX: Other ill-defined heart diseases: I51.89

## 2023-07-27 HISTORY — PX: CATARACT EXTRACTION W/PHACO: SHX586

## 2023-07-27 HISTORY — DX: Sensorineural hearing loss, bilateral: H90.3

## 2023-07-27 HISTORY — DX: Repeated falls: R29.6

## 2023-07-27 HISTORY — DX: Mild cognitive impairment of uncertain or unknown etiology: G31.84

## 2023-07-27 LAB — GLUCOSE, CAPILLARY: Glucose-Capillary: 146 mg/dL — ABNORMAL HIGH (ref 70–99)

## 2023-07-27 SURGERY — PHACOEMULSIFICATION, CATARACT, WITH IOL INSERTION
Anesthesia: Monitor Anesthesia Care | Site: Eye | Laterality: Left

## 2023-07-27 MED ORDER — SIGHTPATH DOSE#1 BSS IO SOLN
INTRAOCULAR | Status: DC | PRN
  Administered 2023-07-27: 72 mL via OPHTHALMIC

## 2023-07-27 MED ORDER — TETRACAINE HCL 0.5 % OP SOLN
1.0000 [drp] | OPHTHALMIC | Status: DC | PRN
  Administered 2023-07-27 (×2): 1 [drp] via OPHTHALMIC

## 2023-07-27 MED ORDER — ARMC OPHTHALMIC DILATING DROPS
OPHTHALMIC | Status: AC
  Filled 2023-07-27: qty 0.5

## 2023-07-27 MED ORDER — BRIMONIDINE TARTRATE-TIMOLOL 0.2-0.5 % OP SOLN
OPHTHALMIC | Status: DC | PRN
  Administered 2023-07-27: 1 [drp] via OPHTHALMIC

## 2023-07-27 MED ORDER — ARMC OPHTHALMIC DILATING DROPS
1.0000 | OPHTHALMIC | Status: DC | PRN
  Administered 2023-07-27: 1 via OPHTHALMIC

## 2023-07-27 MED ORDER — TETRACAINE HCL 0.5 % OP SOLN
OPHTHALMIC | Status: AC
Start: 2023-07-27 — End: ?
  Filled 2023-07-27: qty 4

## 2023-07-27 MED ORDER — SIGHTPATH DOSE#1 BSS IO SOLN
INTRAOCULAR | Status: DC | PRN
  Administered 2023-07-27: 15 mL

## 2023-07-27 MED ORDER — MIDAZOLAM HCL 2 MG/2ML IJ SOLN
INTRAMUSCULAR | Status: AC
  Filled 2023-07-27: qty 2

## 2023-07-27 MED ORDER — FENTANYL CITRATE (PF) 100 MCG/2ML IJ SOLN
INTRAMUSCULAR | Status: DC | PRN
  Administered 2023-07-27: 25 ug via INTRAVENOUS

## 2023-07-27 MED ORDER — ARMC OPHTHALMIC DILATING DROPS
1.0000 | OPHTHALMIC | Status: DC | PRN
Start: 2023-07-27 — End: 2023-07-27
  Administered 2023-07-27 (×2): 1 via OPHTHALMIC

## 2023-07-27 MED ORDER — SIGHTPATH DOSE#1 NA CHONDROIT SULF-NA HYALURON 40-17 MG/ML IO SOLN
INTRAOCULAR | Status: DC | PRN
  Administered 2023-07-27: 1 mL via INTRAOCULAR

## 2023-07-27 MED ORDER — MOXIFLOXACIN HCL 0.5 % OP SOLN
OPHTHALMIC | Status: DC | PRN
  Administered 2023-07-27: .2 mL via OPHTHALMIC

## 2023-07-27 MED ORDER — TETRACAINE HCL 0.5 % OP SOLN
1.0000 [drp] | OPHTHALMIC | Status: DC | PRN
  Administered 2023-07-27: 1 [drp] via OPHTHALMIC

## 2023-07-27 MED ORDER — SIGHTPATH DOSE#1 BSS IO SOLN
INTRAOCULAR | Status: DC | PRN
  Administered 2023-07-27: 1 mL via INTRAMUSCULAR

## 2023-07-27 MED ORDER — MIDAZOLAM HCL 2 MG/2ML IJ SOLN
INTRAMUSCULAR | Status: DC | PRN
  Administered 2023-07-27: .5 mg via INTRAVENOUS

## 2023-07-27 MED ORDER — FENTANYL CITRATE (PF) 100 MCG/2ML IJ SOLN
INTRAMUSCULAR | Status: AC
  Filled 2023-07-27: qty 2

## 2023-07-27 SURGICAL SUPPLY — 12 items
ANGLE REVERSE CUT SHRT 25GA (CUTTER) ×1
CATARACT SUITE SIGHTPATH (MISCELLANEOUS) ×1
CYSTOTOME ANGL RVRS SHRT 25G (CUTTER) ×1 IMPLANT
FEE CATARACT SUITE SIGHTPATH (MISCELLANEOUS) ×1 IMPLANT
GLOVE BIOGEL PI IND STRL 8 (GLOVE) ×1 IMPLANT
GLOVE SURG LX STRL 8.0 MICRO (GLOVE) ×1 IMPLANT
LENS IOL TECNIS EYHANCE 25.5 (Intraocular Lens) IMPLANT
NDL FILTER BLUNT 18X1 1/2 (NEEDLE) ×1 IMPLANT
NEEDLE FILTER BLUNT 18X1 1/2 (NEEDLE) ×1
RING MALYGIN (MISCELLANEOUS) IMPLANT
SUT NYLON 10-0 (SUTURE) IMPLANT
SYR 3ML LL SCALE MARK (SYRINGE) ×1 IMPLANT

## 2023-07-27 NOTE — H&P (Signed)
[facility]   Primary Care Physician:  Malva Limes, MD Ophthalmologist: Dr. Maren Reamer  Pre-Procedure History & Physical: HPI:  Ernest Hurl Jomo Bogdanowicz. is a 87 y.o. male here for cataract surgery.   Past Medical History:  Diagnosis Date   Arthritis    CAD (coronary artery disease)    Carotid artery stenosis    Celiac artery stenosis (HCC)    COVID-19 03/2023   GERD (gastroesophageal reflux disease)    Grade I diastolic dysfunction    Hypertension    Hypothyroidism    Mild cognitive impairment    Mild cognitive impairment    Mild mitral regurgitation by prior echocardiogram    Phimosis    Pre-diabetes    Repeated falls    no injuries   Sensorineural hearing loss, bilateral    Superior mesenteric artery stenosis (HCC)    TIA (transient ischemic attack) 08/2022   per neurology - "Possible TIA"   Wears hearing aid in both ears     Past Surgical History:  Procedure Laterality Date   CAROTID PTA/STENT INTERVENTION Left 04/14/2022   Procedure: CAROTID PTA/STENT INTERVENTION;  Surgeon: Renford Dills, MD;  Location: [facility] INVASIVE CV LAB;  Service: Cardiovascular;  Laterality: Left;   CIRCUMCISION     COLONOSCOPY WITH PROPOFOL N/A 03/31/2016   Procedure: COLONOSCOPY WITH PROPOFOL;  Surgeon: Christena Deem, MD;  Location[facility] ENDOSCOPY;  Service: Endoscopy;  Laterality: N/A;   MRI     SINUS SURGERY  1994   on left side   TONSILLECTOMY      Prior to Admission medications   Medication Sig Start Date End Date Taking? Authorizing Provider  acetaminophen (TYLENOL) 500 MG tablet Take 500 mg by mouth as needed for mild pain.   Yes [provider]  acetaminophen (TYLENOL) 650 MG CR tablet Take 650 mg by mouth as needed for pain.   Yes [provider]  amLODipine (NORVASC) 5 MG tablet Take 1 tablet by mouth once daily 03/17/23  Yes Fisher, Demetrios Isaacs, MD  aspirin EC 81 MG tablet Take 81 mg by mouth daily.   Yes [provider]   atorvastatin (LIPITOR) 40 MG tablet Take 1 tablet by mouth once daily 05/13/23  Yes Fisher, Demetrios Isaacs, MD  carboxymethylcellulose (ARTIFICIAL TEARS) 1 % ophthalmic solution 1 drop.   Yes [provider]  Cholecalciferol (VITAMIN D3) 20 MCG TABS Take 50 mcg by mouth daily.   Yes [provider]  clopidogrel (PLAVIX) 75 MG tablet Take 1 tablet by mouth once daily 11/09/22  Yes Schnier, Latina Craver, MD  Cyanocobalamin (VITAMIN B12) 1000 MCG TBCR Take 1 tablet by mouth daily.   Yes [provider]  hydrochlorothiazide (HYDRODIURIL) 25 MG tablet Take 1 tablet by mouth once daily 12/13/22  Yes Fisher, Demetrios Isaacs, MD  ibuprofen (ADVIL) 200 MG tablet Take 200 mg by mouth as needed.   Yes [provider]  levothyroxine (SYNTHROID) 75 MCG tablet TAKE 1 TABLET BY MOUTH ONCE DAILY BEFORE BREAKFAST 07/05/23  Yes Malva Limes, MD  losartan (COZAAR) 50 MG tablet Take 1 tablet (50 mg total) by mouth daily. 10/21/22  Yes Malva Limes, MD  loteprednol (LOTEMAX) 0.5 % ophthalmic suspension 1 drop 2 (two) times daily. 03/15/23  Yes [provider]  metFORMIN (GLUCOPHAGE-XR) 500 MG 24 hr tablet Take 1 tablet by mouth once daily with breakfast 01/01/23  Yes Fisher, Demetrios Isaacs, MD  polyethylene glycol powder (GLYCOLAX/MIRALAX) 17 GM/SCOOP powder Take 17 g by mouth  daily as needed for mild constipation.   Yes [provider]  tamsulosin (FLOMAX) 0.4 MG CAPS capsule Take 1 capsule (0.4 mg total) by mouth daily after supper. 11/05/22  Yes Sondra Come, MD  traZODone (DESYREL) 100 MG tablet TAKE 1 TABLET BY MOUTH AT BEDTIME 06/01/23  Yes Malva Limes, MD  albuterol (VENTOLIN HFA) 108 (90 Base) MCG/ACT inhaler Inhale 2 puffs into the lungs every 4 (four) hours as needed. Patient not taking: Reported on 07/21/2023 03/25/23   Katha Cabal, DO  benzonatate (TESSALON) 100 MG capsule Take 1 capsule (100 mg total) by mouth every 8 (eight) hours. Patient not taking: Reported on  07/21/2023 03/25/23   Katha Cabal, DO  finasteride (PROSCAR) 5 MG tablet Take 1 tablet (5 mg total) by mouth daily. Patient not taking: Reported on 04/23/2023 11/05/22   Sondra Come, MD  ipratropium (ATROVENT) 0.06 % nasal spray Place 2 sprays into both nostrils 4 (four) times daily. Patient not taking: Reported on 07/21/2023 03/25/23   Katha Cabal, DO    Allergies as of 06/01/2023   (No Known Allergies)    Family History  Problem Relation Age of Onset   Alzheimer's disease Mother    CAD Father    Diabetes Sister        type 2    Social History   Socioeconomic History   Marital status: Married    Spouse name: Not on file   Number of children: 3   Years of education: Not on file   Highest education level: Bachelor's degree (e.g., BA, AB, BS)  Occupational History   Occupation: Occupational hygienist    Comment: retired  Tobacco Use   Smoking status: Never    Passive exposure: Never   Smokeless tobacco: Never   Tobacco comments:    Smoked a little as a teenager  Vaping Use   Vaping status: Never Used  Substance and Sexual Activity   Alcohol use: No    Alcohol/week: 0.0 standard drinks of alcohol   Drug use: No   Sexual activity: Yes    Birth control/protection: None  Other Topics Concern   Not on file  Social History Narrative   Not on file   Social Determinants of Health   Financial Resource Strain: Low Risk  (11/11/2022)   Overall Financial Resource Strain (CARDIA)    Difficulty of Paying Living Expenses: Not hard at all  Food Insecurity: No Food Insecurity (11/11/2022)   Hunger Vital Sign    Worried About Running Out of Food in the Last Year: Never true    Ran Out of Food in the Last Year: Never true  Transportation Needs: No Transportation Needs (11/11/2022)   PRAPARE - Administrator, Civil Service (Medical): No    Lack of Transportation (Non-Medical): No  Physical Activity: Insufficiently Active (11/11/2022)   Exercise Vital Sign    Days of Exercise  per Week: 2 days    Minutes of Exercise per Session: 20 min  Stress: No Stress Concern Present (11/11/2022)   [employer] of Occupational Health - Occupational Stress Questionnaire    Feeling of Stress : Not at all  Social Connections: Moderately Integrated (11/11/2022)   Social Connection and Isolation Panel [NHANES]    Frequency of Communication with Friends and Family: More than three times a week    Frequency of Social Gatherings with Friends and Family: More than three times a week    Attends Religious Services: More than 4 times per year  Active Member of Clubs or Organizations: No    Attends Banker Meetings: Never    Marital Status: Married  Catering manager Violence: Not At Risk (11/11/2022)   Humiliation, Afraid, Rape, and Kick questionnaire    Fear of Current or Ex-Partner: No    Emotionally Abused: No    Physically Abused: No    Sexually Abused: No    Review of Systems: See HPI, otherwise negative ROS  Physical Exam: BP (!) 161/71   Temp 98.8 F (37.1 C) (Temporal)   Resp 14   Ht 5' 9.02" (1.753 m)   Wt 82 kg   SpO2 97%   BMI 26.67 kg/m  General:   Alert, cooperative in NAD Head:  Normocephalic and atraumatic. Respiratory:  Normal work of breathing. Cardiovascular:  RRR  Impression/Plan: Ernest James Medici. is here for cataract surgery.  Risks, benefits, limitations, and alternatives regarding cataract surgery have been reviewed with the patient.  Questions have been answered.  All parties agreeable.   Galen Manila, MD  07/27/2023, 10:02 AM

## 2023-07-27 NOTE — Transfer of Care (Signed)
Immediate Anesthesia Transfer of Care Note  Patient: Ernest James.  Procedure(s) Performed: CATARACT EXTRACTION PHACO AND INTRAOCULAR LENS PLACEMENT (IOC) LEFT DIABETIC  6.83  00:46.8 (Left: Eye)  Patient Location: PACU  Anesthesia Type:MAC  Level of Consciousness: awake, alert , and oriented  Airway & Oxygen Therapy: Patient Spontanous Breathing  Post-op Assessment: Report given to RN, Post -op Vital signs reviewed and stable, and Patient moving all extremities X 4  Post vital signs: Reviewed and stable  Last Vitals:  Vitals Value Taken Time  BP    Temp    Pulse 73 07/27/23 1041  Resp 17 07/27/23 1041  SpO2 97 % 07/27/23 1041  Vitals shown include unfiled device data.  Last Pain:  Vitals:   07/27/23 0920  TempSrc: Temporal  PainSc: 0-No pain         Complications: No notable events documented.

## 2023-07-27 NOTE — Anesthesia Postprocedure Evaluation (Signed)
Anesthesia Post Note  Patient: Ernest James.  Procedure(s) Performed: CATARACT EXTRACTION PHACO AND INTRAOCULAR LENS PLACEMENT (IOC) LEFT DIABETIC  6.83  00:46.8 (Left: Eye)  Patient location during evaluation: PACU Anesthesia Type: MAC Level of consciousness: awake and alert Pain management: pain level controlled Vital Signs Assessment: post-procedure vital signs reviewed and stable Respiratory status: spontaneous breathing, nonlabored ventilation, respiratory function stable and patient connected to nasal cannula oxygen Cardiovascular status: stable and blood pressure returned to baseline Postop Assessment: no apparent nausea or vomiting Anesthetic complications: no   No notable events documented.   Last Vitals:  Vitals:   07/27/23 1045 07/27/23 1046  BP:  (!) 141/73  Pulse: 70 68  Resp: 19 (!) 25  Temp:    SpO2: 96% 96%    Last Pain:  Vitals:   07/27/23 1046  TempSrc:   PainSc: 0-No pain                 Ernest James

## 2023-07-27 NOTE — Anesthesia Preprocedure Evaluation (Addendum)
Anesthesia Evaluation  Patient identified by MRN, date of birth, ID band Patient awake    Reviewed: Allergy & Precautions, H&P , NPO status , Patient's Chart, lab work & pertinent test results  Airway Mallampati: III  TM Distance: <3 FB Neck ROM: Full   Comment: Scant 2 FB TMD Dental no notable dental hx. (+) Caps   Pulmonary sleep apnea    Pulmonary exam normal breath sounds clear to auscultation       Cardiovascular hypertension, + CAD and + Peripheral Vascular Disease  Normal cardiovascular exam+ dysrhythmias + Valvular Problems/Murmurs MR  Rhythm:Regular Rate:Normal  04-07-22 1. Left ventricular ejection fraction, by estimation, is 60 to 65%. The  left ventricle has normal function. The left ventricle has no regional  wall motion abnormalities. There is mild left ventricular hypertrophy.  Left ventricular diastolic parameters  are consistent with Grade I diastolic dysfunction (impaired relaxation).   2. Right ventricular systolic function is normal. The right ventricular  size is normal. Tricuspid regurgitation signal is inadequate for assessing  PA pressure.   3. The mitral valve is normal in structure. Mild mitral valve  regurgitation. No evidence of mitral stenosis.   4. The aortic valve was not well visualized. Aortic valve regurgitation  is mild. No aortic stenosis is present.   5. There is borderline dilatation of the aortic root, measuring 36 mm.   6. The inferior vena cava is normal in size with greater than 50%  respiratory variability, suggesting right atrial pressure of 3 mmHg.      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) COVID-19 Wears hearing aid in both ears--is not wearing them today  Repeated falls TIA (transient ischemic attack)  Mild cognitive impairment    Reproductive/Obstetrics negative OB ROS                             Anesthesia Physical Anesthesia Plan  ASA: 3  Anesthesia Plan: MAC   Post-op Pain Management:    Induction: Intravenous  PONV Risk Score and Plan:   Airway Management Planned: Natural Airway and Nasal Cannula  Additional Equipment:   Intra-op Plan:   Post-operative Plan:   Informed Consent: I have reviewed the patients History and Physical, chart, labs and discussed the procedure including the risks, benefits and alternatives for the proposed anesthesia with the patient or authorized representative who has indicated his/her understanding and acceptance.     Dental Advisory Given  Plan Discussed with: Anesthesiologist, CRNA and Surgeon  Anesthesia Plan Comments: (Patient consented for risks of anesthesia including but not limited to:  - adverse reactions to medications - damage to eyes, teeth, lips or other oral mucosa - nerve damage due to positioning  - sore throat or hoarseness - Damage to heart, brain, nerves, lungs, other parts of body or loss of life  Patient voiced understanding and assent.)       Anesthesia Quick Evaluation

## 2023-07-27 NOTE — Op Note (Signed)
PREOPERATIVE DIAGNOSIS:  Nuclear sclerotic cataract of the left eye.   POSTOPERATIVE DIAGNOSIS:  Nuclear sclerotic cataract of the left eye.   OPERATIVE PROCEDURE:ORPROCALL@   SURGEON:  Galen Manila, MD.   ANESTHESIA:  Anesthesiologist: Marisue Humble, MD CRNA: Lanell Matar, CRNA  1.      Managed anesthesia care. 2.     0.54ml of Shugarcaine was instilled following the paracentesis   COMPLICATIONS: Viscoelastic was used to raise the pupil margin.  A  Malyugin ring was placed as the pupil would not achieve sufficient pharmacologic dilation to undergo cataract extraction safely.( The ring was removed atraumatically following insertion of the IOL.)    TECHNIQUE:   Stop and chop   DESCRIPTION OF PROCEDURE:  The patient was examined and consented in the preoperative holding area where the aforementioned topical anesthesia was applied to the left eye and then brought back to the Operating Room where the left eye was prepped and draped in the usual sterile ophthalmic fashion and a lid speculum was placed. A paracentesis was created with the side port blade and the anterior chamber was filled with viscoelastic. A near clear corneal incision was performed with the steel keratome. A continuous curvilinear capsulorrhexis was performed with a cystotome followed by the capsulorrhexis forceps. Hydrodissection and hydrodelineation were carried out with BSS on a blunt cannula. The lens was removed in a stop and chop  technique and the remaining cortical material was removed with the irrigation-aspiration handpiece. The capsular bag was inflated with viscoelastic and the Technis [id number] lens was placed in the capsular bag without complication. The remaining viscoelastic was removed from the eye with the irrigation-aspiration handpiece. The wounds were hydrated. The anterior chamber was flushed with BSS and the eye was inflated to physiologic pressure. 0.52ml Vigamox was placed in the anterior chamber. The  wounds were found to be water tight. The eye was dressed with Combigan. The patient was given protective glasses to wear throughout the day and a shield with which to sleep tonight. The patient was also given drops with which to begin a drop regimen today and will follow-up with me in one day. Implant Name Type Inv. Item Serial No. Manufacturer Lot No. LRB No. Used Action  LENS IOL TECNIS EYHANCE 25.5 - [id number] Intraocular Lens LENS IOL TECNIS EYHANCE 25.5 [id number] SIGHTPATH  Left 1 Implanted    Procedure(s): CATARACT EXTRACTION PHACO AND INTRAOCULAR LENS PLACEMENT (IOC) LEFT DIABETIC  6.83  00:46.8 (Left)  Electronically signed: Galen Manila 07/27/2023 10:38 AM

## 2023-07-28 ENCOUNTER — Encounter: Payer: Self-pay | Admitting: Ophthalmology

## 2023-07-28 DIAGNOSIS — H2511 Age-related nuclear cataract, right eye: Secondary | ICD-10-CM | POA: Diagnosis not present

## 2023-07-30 ENCOUNTER — Telehealth (INDEPENDENT_AMBULATORY_CARE_PROVIDER_SITE_OTHER): Payer: Self-pay

## 2023-07-30 NOTE — Telephone Encounter (Addendum)
Ernest James called on her fathers behalf, wondering if could he have a an carotid ultrasound after having cataract surgery last week. His eye dr.  said it would be fine, but wanted to check with [place]. Also, wanted to know what 2 ultrasounds will he be receiving and a better understanding on if he will see Dr.Schneir or not Monday.   Per [facility]- Patient can have the ultrasound done, its only a 1 carotid ultrasound, and if it is good, he will be scheduled a 1 yr follow up.

## 2023-08-02 ENCOUNTER — Ambulatory Visit (INDEPENDENT_AMBULATORY_CARE_PROVIDER_SITE_OTHER): Payer: Medicare Other | Admitting: Vascular Surgery

## 2023-08-02 ENCOUNTER — Ambulatory Visit (INDEPENDENT_AMBULATORY_CARE_PROVIDER_SITE_OTHER): Payer: Medicare Other

## 2023-08-02 ENCOUNTER — Other Ambulatory Visit: Payer: Self-pay | Admitting: Family Medicine

## 2023-08-02 DIAGNOSIS — I6523 Occlusion and stenosis of bilateral carotid arteries: Secondary | ICD-10-CM

## 2023-08-02 DIAGNOSIS — I10 Essential (primary) hypertension: Secondary | ICD-10-CM

## 2023-08-04 NOTE — Discharge Instructions (Signed)

## 2023-08-09 ENCOUNTER — Telehealth (INDEPENDENT_AMBULATORY_CARE_PROVIDER_SITE_OTHER): Payer: Self-pay

## 2023-08-09 DIAGNOSIS — I6521 Occlusion and stenosis of right carotid artery: Secondary | ICD-10-CM

## 2023-08-09 MED ORDER — CLOPIDOGREL BISULFATE 75 MG PO TABS: 75.0000 mg | ORAL_TABLET | Freq: Every day | ORAL | 0 refills | Status: DC

## 2023-08-09 NOTE — Telephone Encounter (Addendum)
Patient called for a refill of Plavix. Rx sent to [facility] in [facility]

## 2023-08-10 ENCOUNTER — Encounter: Payer: Self-pay | Admitting: Ophthalmology

## 2023-08-10 ENCOUNTER — Ambulatory Visit: Payer: Medicare Other | Admitting: Anesthesiology

## 2023-08-10 ENCOUNTER — Ambulatory Visit
Admission: RE | Admit: 2023-08-10 | Discharge: 2023-08-10 | Disposition: A | Discharge status: Home / Self Care | Payer: Medicare Other | Source: Ambulatory Visit | Attending: Ophthalmology | Admitting: Ophthalmology

## 2023-08-10 ENCOUNTER — Other Ambulatory Visit: Payer: Self-pay

## 2023-08-10 ENCOUNTER — Encounter
Admission: RE | Disposition: A | Discharge status: Home / Self Care | Payer: Self-pay | Source: Ambulatory Visit | Attending: Ophthalmology

## 2023-08-10 DIAGNOSIS — R7303 Prediabetes: Secondary | ICD-10-CM | POA: Diagnosis not present

## 2023-08-10 DIAGNOSIS — G4730 Sleep apnea, unspecified: Secondary | ICD-10-CM | POA: Insufficient documentation

## 2023-08-10 DIAGNOSIS — K219 Gastro-esophageal reflux disease without esophagitis: Secondary | ICD-10-CM | POA: Insufficient documentation

## 2023-08-10 DIAGNOSIS — H2511 Age-related nuclear cataract, right eye: Secondary | ICD-10-CM | POA: Insufficient documentation

## 2023-08-10 DIAGNOSIS — E1036 Type 1 diabetes mellitus with diabetic cataract: Secondary | ICD-10-CM | POA: Diagnosis not present

## 2023-08-10 DIAGNOSIS — I10 Essential (primary) hypertension: Secondary | ICD-10-CM | POA: Insufficient documentation

## 2023-08-10 DIAGNOSIS — I2510 Atherosclerotic heart disease of native coronary artery without angina pectoris: Secondary | ICD-10-CM | POA: Diagnosis not present

## 2023-08-10 DIAGNOSIS — I739 Peripheral vascular disease, unspecified: Secondary | ICD-10-CM | POA: Insufficient documentation

## 2023-08-10 HISTORY — PX: CATARACT EXTRACTION W/PHACO: SHX586

## 2023-08-10 LAB — GLUCOSE, CAPILLARY: Glucose-Capillary: 133 mg/dL — ABNORMAL HIGH (ref 70–99)

## 2023-08-10 SURGERY — PHACOEMULSIFICATION, CATARACT, WITH IOL INSERTION
Anesthesia: Monitor Anesthesia Care | Site: Eye | Laterality: Right

## 2023-08-10 MED ORDER — ARMC OPHTHALMIC DILATING DROPS
1.0000 | OPHTHALMIC | Status: AC
  Administered 2023-08-10 (×3): 1 via OPHTHALMIC

## 2023-08-10 MED ORDER — TETRACAINE HCL 0.5 % OP SOLN
OPHTHALMIC | Status: AC
  Filled 2023-08-10: qty 4

## 2023-08-10 MED ORDER — FENTANYL CITRATE (PF) 100 MCG/2ML IJ SOLN
INTRAMUSCULAR | Status: DC | PRN
  Administered 2023-08-10: 25 ug via INTRAVENOUS

## 2023-08-10 MED ORDER — SIGHTPATH DOSE#1 BSS IO SOLN
INTRAOCULAR | Status: DC | PRN
  Administered 2023-08-10: 15 mL via INTRAOCULAR

## 2023-08-10 MED ORDER — MOXIFLOXACIN HCL 0.5 % OP SOLN
OPHTHALMIC | Status: DC | PRN
  Administered 2023-08-10: .2 mL via OPHTHALMIC

## 2023-08-10 MED ORDER — SIGHTPATH DOSE#1 BSS IO SOLN
INTRAOCULAR | Status: DC | PRN
  Administered 2023-08-10: 60 mL via OPHTHALMIC

## 2023-08-10 MED ORDER — FENTANYL CITRATE (PF) 100 MCG/2ML IJ SOLN
INTRAMUSCULAR | Status: AC
  Filled 2023-08-10: qty 2

## 2023-08-10 MED ORDER — MIDAZOLAM HCL 2 MG/2ML IJ SOLN
INTRAMUSCULAR | Status: AC
  Filled 2023-08-10: qty 2

## 2023-08-10 MED ORDER — SIGHTPATH DOSE#1 NA CHONDROIT SULF-NA HYALURON 40-17 MG/ML IO SOLN
INTRAOCULAR | Status: DC | PRN
  Administered 2023-08-10: 1 mL via INTRAOCULAR

## 2023-08-10 MED ORDER — TETRACAINE HCL 0.5 % OP SOLN
1.0000 [drp] | OPHTHALMIC | Status: AC
  Administered 2023-08-10 (×3): 1 [drp] via OPHTHALMIC

## 2023-08-10 MED ORDER — MIDAZOLAM HCL 2 MG/2ML IJ SOLN
INTRAMUSCULAR | Status: DC | PRN
  Administered 2023-08-10: .5 mg via INTRAVENOUS

## 2023-08-10 MED ORDER — SIGHTPATH DOSE#1 BSS IO SOLN
INTRAOCULAR | Status: DC | PRN
  Administered 2023-08-10: 2 mL

## 2023-08-10 SURGICAL SUPPLY — 14 items
ANGLE REVERSE CUT SHRT 25GA (CUTTER) ×1
CANNULA ANT/CHMB 27G (MISCELLANEOUS) IMPLANT
CANNULA ANT/CHMB 27GA (MISCELLANEOUS)
CATARACT SUITE SIGHTPATH (MISCELLANEOUS) ×1
CYSTOTOME ANGL RVRS SHRT 25G (CUTTER) ×1 IMPLANT
FEE CATARACT SUITE SIGHTPATH (MISCELLANEOUS) ×1 IMPLANT
GLOVE BIOGEL PI IND STRL 8 (GLOVE) ×1 IMPLANT
GLOVE SURG LX STRL 8.0 MICRO (GLOVE) ×1 IMPLANT
LENS IOL TECNIS EYHANCE 20.5 (Intraocular Lens) IMPLANT
NDL FILTER BLUNT 18X1 1/2 (NEEDLE) ×1 IMPLANT
NEEDLE FILTER BLUNT 18X1 1/2 (NEEDLE) ×1
RING MALYGIN (MISCELLANEOUS) IMPLANT
SUT NYLON 10-0 (SUTURE) IMPLANT
SYR 3ML LL SCALE MARK (SYRINGE) ×1 IMPLANT

## 2023-08-10 NOTE — Anesthesia Preprocedure Evaluation (Signed)
Anesthesia Evaluation  Patient identified by MRN, date of birth, ID band Patient awake    Reviewed: Allergy & Precautions, H&P , NPO status , Patient's Chart, lab work & pertinent test results, reviewed documented beta blocker date and time   Airway Mallampati: II  TM Distance: >3 FB Neck ROM: full    Dental no notable dental hx. (+) Teeth Intact   Pulmonary sleep apnea    Pulmonary exam normal breath sounds clear to auscultation       Cardiovascular Exercise Tolerance: Good hypertension, + CAD and + Peripheral Vascular Disease  + dysrhythmias  Rhythm:regular Rate:Normal     Neuro/Psych TIA negative psych ROS   GI/Hepatic Neg liver ROS,GERD  Medicated,,  Endo/Other  negative endocrine ROSdiabetes    Renal/GU      Musculoskeletal   Abdominal   Peds  Hematology negative hematology ROS (+)   Anesthesia Other Findings   Reproductive/Obstetrics negative OB ROS                             Anesthesia Physical Anesthesia Plan  ASA: 3  Anesthesia Plan: MAC   Post-op Pain Management:    Induction:   PONV Risk Score and Plan: 1  Airway Management Planned:   Additional Equipment:   Intra-op Plan:   Post-operative Plan:   Informed Consent: I have reviewed the patients History and Physical, chart, labs and discussed the procedure including the risks, benefits and alternatives for the proposed anesthesia with the patient or authorized representative who has indicated his/her understanding and acceptance.       Plan Discussed with: CRNA  Anesthesia Plan Comments:        Anesthesia Quick Evaluation

## 2023-08-10 NOTE — H&P (Signed)
[facility]   Primary Care Physician:  Malva Limes, MD Ophthalmologist: Dr. Druscilla Brownie  Pre-Procedure History & Physical: HPI:  Ernest Hurl Treye Ludington. is a 87 y.o. male here for cataract surgery.   Past Medical History:  Diagnosis Date   Arthritis    CAD (coronary artery disease)    Carotid artery stenosis    Celiac artery stenosis (HCC)    COVID-19 03/2023   GERD (gastroesophageal reflux disease)    Grade I diastolic dysfunction    Hypertension    Hypothyroidism    Mild cognitive impairment    Mild cognitive impairment    Mild mitral regurgitation by prior echocardiogram    Phimosis    Pre-diabetes    Repeated falls    no injuries   Sensorineural hearing loss, bilateral    Superior mesenteric artery stenosis (HCC)    TIA (transient ischemic attack) 08/2022   per neurology - "Possible TIA"   Wears hearing aid in both ears     Past Surgical History:  Procedure Laterality Date   CAROTID PTA/STENT INTERVENTION Left 04/14/2022   Procedure: CAROTID PTA/STENT INTERVENTION;  Surgeon: Renford Dills, MD;  Location: [facility] INVASIVE CV LAB;  Service: Cardiovascular;  Laterality: Left;   CATARACT EXTRACTION W/PHACO Left 07/27/2023   Procedure: CATARACT EXTRACTION PHACO AND INTRAOCULAR LENS PLACEMENT (IOC) LEFT DIABETIC  6.83  00:46.8;  Surgeon: Galen Manila, MD;  Location: [facility];  Service: Ophthalmology;  Laterality: Left;   CIRCUMCISION     COLONOSCOPY WITH PROPOFOL N/A 03/31/2016   Procedure: COLONOSCOPY WITH PROPOFOL;  Surgeon: Christena Deem, MD;  Location: [facility];  Service: Endoscopy;  Laterality: N/A;   MRI     SINUS SURGERY  1994   on left side   TONSILLECTOMY      Prior to Admission medications   Medication Sig Start Date End Date Taking? Authorizing Provider  acetaminophen (TYLENOL) 500 MG tablet Take 500 mg by mouth as needed for mild pain.   Yes [provider]  amLODipine (NORVASC) 5 MG tablet Take 1 tablet by  mouth once daily 03/17/23  Yes Fisher, Demetrios Isaacs, MD  aspirin EC 81 MG tablet Take 81 mg by mouth daily.   Yes [provider]  atorvastatin (LIPITOR) 40 MG tablet Take 1 tablet by mouth once daily 05/13/23  Yes Fisher, Demetrios Isaacs, MD  carboxymethylcellulose (ARTIFICIAL TEARS) 1 % ophthalmic solution 1 drop.   Yes [provider]  Cholecalciferol (VITAMIN D3) 20 MCG TABS Take 50 mcg by mouth daily.   Yes [provider]  clopidogrel (PLAVIX) 75 MG tablet Take 1 tablet (75 mg total) by mouth daily. 08/09/23  Yes Schnier, Latina Craver, MD  Cyanocobalamin (VITAMIN B12) 1000 MCG TBCR Take 1 tablet by mouth daily.   Yes [provider]  hydrochlorothiazide (HYDRODIURIL) 25 MG tablet Take 1 tablet by mouth once daily 12/13/22  Yes Fisher, Demetrios Isaacs, MD  ibuprofen (ADVIL) 200 MG tablet Take 200 mg by mouth as needed.   Yes [provider]  levothyroxine (SYNTHROID) 75 MCG tablet TAKE 1 TABLET BY MOUTH ONCE DAILY BEFORE BREAKFAST 07/05/23  Yes Malva Limes, MD  losartan (COZAAR) 50 MG tablet Take 1 tablet by mouth once daily 08/03/23  Yes Fisher, Demetrios Isaacs, MD  loteprednol (LOTEMAX) 0.5 % ophthalmic suspension 1 drop 2 (two) times daily. 03/15/23  Yes [provider]  metFORMIN (GLUCOPHAGE-XR) 500 MG 24 hr tablet Take 1 tablet by mouth once daily with breakfast 01/01/23  Yes Malva Limes, MD  polyethylene glycol powder (GLYCOLAX/MIRALAX) 17 GM/SCOOP powder Take 17 g by mouth daily as needed for mild constipation.   Yes [provider]  tamsulosin (FLOMAX) 0.4 MG CAPS capsule Take 1 capsule (0.4 mg total) by mouth daily after supper. 11/05/22  Yes Sondra Come, MD  traZODone (DESYREL) 100 MG tablet TAKE 1 TABLET BY MOUTH AT BEDTIME 06/01/23  Yes Malva Limes, MD  acetaminophen (TYLENOL) 650 MG CR tablet Take 650 mg by mouth as needed for pain.    [provider]  albuterol (VENTOLIN HFA) 108 (90 Base) MCG/ACT inhaler Inhale 2 puffs into the  lungs every 4 (four) hours as needed. Patient not taking: Reported on 07/21/2023 03/25/23   Katha Cabal, DO  benzonatate (TESSALON) 100 MG capsule Take 1 capsule (100 mg total) by mouth every 8 (eight) hours. Patient not taking: Reported on 07/21/2023 03/25/23   Katha Cabal, DO  finasteride (PROSCAR) 5 MG tablet Take 1 tablet (5 mg total) by mouth daily. Patient not taking: Reported on 04/23/2023 11/05/22   Sondra Come, MD  ipratropium (ATROVENT) 0.06 % nasal spray Place 2 sprays into both nostrils 4 (four) times daily. Patient not taking: Reported on 07/21/2023 03/25/23   Katha Cabal, DO    Allergies as of 06/01/2023   (No Known Allergies)    Family History  Problem Relation Age of Onset   Alzheimer's disease Mother    CAD Father    Diabetes Sister        type 2    Social History   Socioeconomic History   Marital status: Married    Spouse name: Not on file   Number of children: 3   Years of education: Not on file   Highest education level: Bachelor's degree (e.g., BA, AB, BS)  Occupational History   Occupation: Occupational hygienist    Comment: retired  Tobacco Use   Smoking status: Never    Passive exposure: Never   Smokeless tobacco: Never   Tobacco comments:    Smoked a little as a teenager  Vaping Use   Vaping status: Never Used  Substance and Sexual Activity   Alcohol use: No    Alcohol/week: 0.0 standard drinks of alcohol   Drug use: No   Sexual activity: Yes    Birth control/protection: None  Other Topics Concern   Not on file  Social History Narrative   Not on file   Social Determinants of Health   Financial Resource Strain: Low Risk  (11/11/2022)   Overall Financial Resource Strain (CARDIA)    Difficulty of Paying Living Expenses: Not hard at all  Food Insecurity: No Food Insecurity (11/11/2022)   Hunger Vital Sign    Worried About Running Out of Food in the Last Year: Never true    Ran Out of Food in the Last Year: Never true  Transportation Needs: No  Transportation Needs (11/11/2022)   PRAPARE - Administrator, Civil Service (Medical): No    Lack of Transportation (Non-Medical): No  Physical Activity: Insufficiently Active (11/11/2022)   Exercise Vital Sign    Days of Exercise per Week: 2 days    Minutes of Exercise per Session: 20 min  Stress: No Stress Concern Present (11/11/2022)   [employer] of Occupational Health - Occupational Stress Questionnaire    Feeling of Stress : Not at all  Social Connections: Moderately Integrated (11/11/2022)   Social Connection and Isolation Panel [NHANES]    Frequency of Communication  with Friends and Family: More than three times a week    Frequency of Social Gatherings with Friends and Family: More than three times a week    Attends Religious Services: More than 4 times per year    Active Member of [employer] or Organizations: No    Attends Banker Meetings: Never    Marital Status: Married  Catering manager Violence: Not At Risk (11/11/2022)   Humiliation, Afraid, Rape, and Kick questionnaire    Fear of Current or Ex-Partner: No    Emotionally Abused: No    Physically Abused: No    Sexually Abused: No    Review of Systems: See HPI, otherwise negative ROS  Physical Exam: BP (!) 154/74   Temp 99.7 F (37.6 C) (Temporal)   Resp 18   Ht 5' 9.02" (1.753 m)   Wt 82.4 kg   SpO2 95%   BMI 26.82 kg/m  General:   Alert, cooperative in NAD Head:  Normocephalic and atraumatic. Respiratory:  Normal work of breathing. Cardiovascular:  RRR  Impression/Plan: Ernest Hurl Boyce Medici. is here for cataract surgery.  Risks, benefits, limitations, and alternatives regarding cataract surgery have been reviewed with the patient.  Questions have been answered.  All parties agreeable.   Galen Manila, MD  08/10/2023, 11:24 AM

## 2023-08-10 NOTE — Transfer of Care (Signed)
Immediate Anesthesia Transfer of Care Note  Patient: [name].  Procedure(s) Performed: CATARACT EXTRACTION PHACO AND INTRAOCULAR LENS PLACEMENT (IOC) RIGHT  DIABETIC malyugin 8.13 00:58.0 (Right: Eye)  Patient Location: PACU  Anesthesia Type:MAC  Level of Consciousness: awake, alert , and oriented  Airway & Oxygen Therapy: Patient Spontanous Breathing  Post-op Assessment: Report given to RN and Post -op Vital signs reviewed and stable  Post vital signs: Reviewed and stable  Last Vitals:  Vitals Value Taken Time  BP    Temp    Pulse 65 08/10/23 1156  Resp 24 08/10/23 1156  SpO2 96 % 08/10/23 1156  Vitals shown include unfiled device data.  Last Pain:  Vitals:   08/10/23 1016  TempSrc: Temporal  PainSc: 0-No pain         Complications: No notable events documented.

## 2023-08-10 NOTE — Op Note (Signed)
PREOPERATIVE DIAGNOSIS:  Nuclear sclerotic cataract of the right eye.   POSTOPERATIVE DIAGNOSIS:  Cataract   OPERATIVE PROCEDURE:ORPROCALL@   SURGEON:  Ernest Manila, MD.   ANESTHESIA:  Anesthesiologist: Yevette Edwards, MD CRNA: Lanell Matar, CRNA  1.      Managed anesthesia care. 2.      0.8ml of Shugarcaine was instilled in the eye following the paracentesis.   COMPLICATIONS: Viscoelastic was used to raise the pupil margin.  A  Malyugin ring was placed as the pupil would not achieve sufficient pharmacologic dilation to undergo cataract extraction safely.( The ring was removed atraumatically following insertion of the IOL.)    TECHNIQUE:   Stop and chop   DESCRIPTION OF PROCEDURE:  The patient was examined and consented in the preoperative holding area where the aforementioned topical anesthesia was applied to the right eye and then brought back to the Operating Room where the right eye was prepped and draped in the usual sterile ophthalmic fashion and a lid speculum was placed. A paracentesis was created with the side port blade and the anterior chamber was filled with viscoelastic. A near clear corneal incision was performed with the steel keratome. A continuous curvilinear capsulorrhexis was performed with a cystotome followed by the capsulorrhexis forceps. Hydrodissection and hydrodelineation were carried out with BSS on a blunt cannula. The lens was removed in a stop and chop  technique and the remaining cortical material was removed with the irrigation-aspiration handpiece. The capsular bag was inflated with viscoelastic and the Technis ZCB00  lens was placed in the capsular bag without complication. The remaining viscoelastic was removed from the eye with the irrigation-aspiration handpiece. The wounds were hydrated. The anterior chamber was flushed with BSS and the eye was inflated to physiologic pressure. 0.64ml of Vigamox was placed in the anterior chamber. A single 10-0 nylon  suture was placed to ensure the closure of the main incision. The wounds were found to be water tight. The eye was dressed with Combigan. The patient was given protective glasses to wear throughout the day and a shield with which to sleep tonight. The patient was also given drops with which to begin a drop regimen today and will follow-up with me in one day. Implant Name Type Inv. Item Serial No. Manufacturer Lot No. LRB No. Used Action  LENS IOL TECNIS EYHANCE 20.5 - [id number] Intraocular Lens LENS IOL TECNIS EYHANCE 20.5 [id number] [employer]PATH  Right 1 Implanted   Procedure(s): CATARACT EXTRACTION PHACO AND INTRAOCULAR LENS PLACEMENT (IOC) RIGHT  DIABETIC malyugin 8.13 00:58.0 (Right)  Electronically signed: Galen James 08/10/2023 11:54 AM

## 2023-08-10 NOTE — Anesthesia Postprocedure Evaluation (Signed)
Anesthesia Post Note  Patient: Ernest James.  Procedure(s) Performed: CATARACT EXTRACTION PHACO AND INTRAOCULAR LENS PLACEMENT (IOC) RIGHT  DIABETIC malyugin 8.13 00:58.0 (Right: Eye)  Patient location during evaluation: PACU Anesthesia Type: MAC Level of consciousness: awake and alert Pain management: pain level controlled Vital Signs Assessment: post-procedure vital signs reviewed and stable Respiratory status: spontaneous breathing, nonlabored ventilation, respiratory function stable and patient connected to nasal cannula oxygen Cardiovascular status: stable and blood pressure returned to baseline Postop Assessment: no apparent nausea or vomiting Anesthetic complications: no   No notable events documented.   Last Vitals:  Vitals:   08/10/23 1016 08/10/23 1201  BP: (!) 154/74 139/69  Pulse:  63  Resp: 18 (!) 25  Temp: 37.6 C 36.6 C  SpO2: 95% 97%    Last Pain:  Vitals:   08/10/23 1201  TempSrc:   PainSc: 0-No pain                 Yevette Edwards

## 2023-08-14 ENCOUNTER — Encounter: Payer: Self-pay | Admitting: Ophthalmology

## 2023-08-17 DIAGNOSIS — L570 Actinic keratosis: Secondary | ICD-10-CM | POA: Diagnosis not present

## 2023-08-17 DIAGNOSIS — L308 Other specified dermatitis: Secondary | ICD-10-CM | POA: Diagnosis not present

## 2023-08-31 ENCOUNTER — Other Ambulatory Visit: Payer: Self-pay | Admitting: Family Medicine

## 2023-08-31 DIAGNOSIS — I10 Essential (primary) hypertension: Secondary | ICD-10-CM

## 2023-09-03 DIAGNOSIS — Z961 Presence of intraocular lens: Secondary | ICD-10-CM | POA: Diagnosis not present

## 2023-09-29 ENCOUNTER — Other Ambulatory Visit: Payer: Self-pay | Admitting: Family Medicine

## 2023-09-29 DIAGNOSIS — E039 Hypothyroidism, unspecified: Secondary | ICD-10-CM

## 2023-10-22 DIAGNOSIS — Z961 Presence of intraocular lens: Secondary | ICD-10-CM | POA: Diagnosis not present

## 2023-10-22 DIAGNOSIS — M3501 Sicca syndrome with keratoconjunctivitis: Secondary | ICD-10-CM | POA: Diagnosis not present

## 2023-10-22 DIAGNOSIS — H02889 Meibomian gland dysfunction of unspecified eye, unspecified eyelid: Secondary | ICD-10-CM | POA: Diagnosis not present

## 2023-12-01 DIAGNOSIS — M542 Cervicalgia: Secondary | ICD-10-CM | POA: Diagnosis not present

## 2023-12-01 DIAGNOSIS — M5030 Other cervical disc degeneration, unspecified cervical region: Secondary | ICD-10-CM | POA: Diagnosis not present

## 2023-12-01 DIAGNOSIS — M5412 Radiculopathy, cervical region: Secondary | ICD-10-CM | POA: Diagnosis not present

## 2023-12-01 DIAGNOSIS — R7309 Other abnormal glucose: Secondary | ICD-10-CM | POA: Diagnosis not present

## 2023-12-01 DIAGNOSIS — M720 Palmar fascial fibromatosis [Dupuytren]: Secondary | ICD-10-CM | POA: Diagnosis not present

## 2023-12-01 DIAGNOSIS — G5601 Carpal tunnel syndrome, right upper limb: Secondary | ICD-10-CM | POA: Diagnosis not present

## 2023-12-12 ENCOUNTER — Other Ambulatory Visit: Payer: Self-pay | Admitting: Family Medicine

## 2023-12-12 ENCOUNTER — Other Ambulatory Visit (INDEPENDENT_AMBULATORY_CARE_PROVIDER_SITE_OTHER): Payer: Self-pay | Admitting: Vascular Surgery

## 2023-12-12 DIAGNOSIS — I10 Essential (primary) hypertension: Secondary | ICD-10-CM

## 2023-12-12 DIAGNOSIS — I6521 Occlusion and stenosis of right carotid artery: Secondary | ICD-10-CM

## 2023-12-23 ENCOUNTER — Other Ambulatory Visit: Payer: Self-pay | Admitting: Urology

## 2023-12-23 DIAGNOSIS — N401 Enlarged prostate with lower urinary tract symptoms: Secondary | ICD-10-CM

## 2023-12-25 ENCOUNTER — Other Ambulatory Visit: Payer: Self-pay | Admitting: Family Medicine

## 2023-12-25 DIAGNOSIS — I10 Essential (primary) hypertension: Secondary | ICD-10-CM

## 2023-12-28 ENCOUNTER — Telehealth: Payer: Self-pay

## 2023-12-28 NOTE — Telephone Encounter (Signed)
 I don't see a request for this but apparently they are asking if ok to change manufacturer   Copied from CRM [id number]. Topic: General - Other >> Dec 28, 2023  1:40 PM Emylou G wrote: Reason for CRM: [facility] called [phone] levothyroxine  (SYNTHROID ) 75 MCG tablet checking status of fax.. to change from the euthyrox  to alvogen

## 2023-12-29 ENCOUNTER — Other Ambulatory Visit: Payer: Self-pay

## 2023-12-29 DIAGNOSIS — E039 Hypothyroidism, unspecified: Secondary | ICD-10-CM

## 2023-12-29 MED ORDER — LEVOTHYROXINE SODIUM 75 MCG PO TABS: 75.0000 ug | ORAL_TABLET | Freq: Every day | ORAL | 4 refills | Status: AC

## 2023-12-29 NOTE — Telephone Encounter (Signed)
 Done

## 2023-12-29 NOTE — Telephone Encounter (Signed)
 Yes, they may change manufacturer

## 2024-01-04 DIAGNOSIS — G5601 Carpal tunnel syndrome, right upper limb: Secondary | ICD-10-CM | POA: Diagnosis not present

## 2024-01-07 DIAGNOSIS — G5601 Carpal tunnel syndrome, right upper limb: Secondary | ICD-10-CM | POA: Diagnosis not present

## 2024-01-07 DIAGNOSIS — E1142 Type 2 diabetes mellitus with diabetic polyneuropathy: Secondary | ICD-10-CM | POA: Diagnosis not present

## 2024-01-19 ENCOUNTER — Ambulatory Visit (INDEPENDENT_AMBULATORY_CARE_PROVIDER_SITE_OTHER): Admitting: Urology

## 2024-01-19 VITALS — BP 175/80 | HR 69 | Wt 188.2 lb

## 2024-01-19 DIAGNOSIS — N401 Enlarged prostate with lower urinary tract symptoms: Secondary | ICD-10-CM | POA: Diagnosis not present

## 2024-01-19 DIAGNOSIS — R3911 Hesitancy of micturition: Secondary | ICD-10-CM

## 2024-01-19 DIAGNOSIS — R351 Nocturia: Secondary | ICD-10-CM | POA: Diagnosis not present

## 2024-01-19 DIAGNOSIS — R339 Retention of urine, unspecified: Secondary | ICD-10-CM | POA: Diagnosis not present

## 2024-01-19 LAB — BLADDER SCAN AMB NON-IMAGING

## 2024-01-19 MED ORDER — FINASTERIDE 5 MG PO TABS: 5.0000 mg | ORAL_TABLET | Freq: Every day | ORAL | 11 refills | Status: AC

## 2024-01-19 MED ORDER — TAMSULOSIN HCL 0.4 MG PO CAPS: 0.4000 mg | ORAL_CAPSULE | Freq: Every day | ORAL | 11 refills | Status: AC

## 2024-01-19 NOTE — Patient Instructions (Signed)

## 2024-01-19 NOTE — Progress Notes (Signed)
   01/19/2024 1:03 PM   Ernest James. Aug 11, 1936 [id number]  Reason for visit: Follow up nocturia, BPH, history of retention  HPI: Comorbid 88 year old male here again with his daughter for follow-up of nocturia.  She again provides most of the history.  I last saw him in February 2024 for similar issues.  He does have a history of retention after a vascular procedure in 2023, has been on Flomax  since that time.  PVRs have been normal since then.  PSA was normal at 1.7 in May 2020.,  Screening discontinued per guideline recommendations.  Prior CT in 2023 showed 120 g prostate, no hydronephrosis.  At our prior visit I had recommended considering adding finasteride , but he never started this medication.  He also has sleep apnea, but has deferred CPAP use.  He denies any major changes since our last visit.  Primary issue is nocturia 2-3 times overnight.  PVR is normal again today at 78ml.  He is on Flomax  alone, never started the finasteride .  He tries to minimize fluids prior to bedtime, has worked on lower extremity edema.  We again discussed nocturia strategies at length, as well as considering addition of finasteride .  He is not bothered enough to consider more aggressive treatments or outlet procedures, and I do not think he would benefit significantly from an outlet procedure based on his absence of symptoms during the day.  We also discussed considering CPAP use again.  He was interested in trying the finasteride .  Risk and benefits discussed.  -Continue Flomax , refilled -Start finasteride  -Nocturia strategies discussed at length -RTC 1 year PVR   Lawerence Pressman, MD  [facility] [address], [address] [place], [place] [address] [phone]

## 2024-01-23 ENCOUNTER — Other Ambulatory Visit: Payer: Self-pay | Admitting: Family Medicine

## 2024-01-23 DIAGNOSIS — E119 Type 2 diabetes mellitus without complications: Secondary | ICD-10-CM

## 2024-01-23 DIAGNOSIS — I10 Essential (primary) hypertension: Secondary | ICD-10-CM

## 2024-01-25 ENCOUNTER — Ambulatory Visit (INDEPENDENT_AMBULATORY_CARE_PROVIDER_SITE_OTHER)

## 2024-01-25 VITALS — BP 156/70 | Ht 69.5 in | Wt 187.4 lb

## 2024-01-25 DIAGNOSIS — Z0000 Encounter for general adult medical examination without abnormal findings: Secondary | ICD-10-CM

## 2024-01-25 NOTE — Patient Instructions (Addendum)
 Mr. Pritt , Thank you for taking time out of your busy schedule to complete your Annual Wellness Visit with me. I enjoyed our conversation and look forward to speaking with you again next year. I, as well as your care team,  appreciate your ongoing commitment to your health goals. Please review the following plan we discussed and let me know if I can assist you in the future.  Follow up Visits: Next Medicare AWV with our clinical staff:   01/31/25 @ 2:30 PM IN PERSON Have you seen your provider in the last 6 months (3 months if uncontrolled diabetes)? Yes   Clinician Recommendations:  Aim for 30 minutes of exercise or brisk walking, 6-8 glasses of water, and 5 servings of fruits and vegetables each day. TAKE CARE!      This is a list of the screening recommended for you and due dates:  Health Maintenance  Topic Date Due   Colon Cancer Screening  03/31/2021   COVID-19 Vaccine (4 - 2024-25 season) 05/09/2023   Hemoglobin A1C  10/24/2023   Eye exam for diabetics  11/06/2023   Yearly kidney health urinalysis for diabetes  12/04/2023   Flu Shot  04/07/2024   Yearly kidney function blood test for diabetes  04/22/2024   DTaP/Tdap/Td vaccine (3 - Td or Tdap) 07/08/2024   Medicare Annual Wellness Visit  01/24/2025   Pneumonia Vaccine  Completed   Zoster (Shingles) Vaccine  Completed   HPV Vaccine  Aged Out   Meningitis B Vaccine  Aged Out    Advanced directives: (ACP Link)Information on Advanced Care Planning can be found at [employer] Advance Health Care Directives Advance Health Care Directives. [email or web address]  Advance Care Planning is important because it:  [x]  Makes sure you receive the medical care that is consistent with your values, goals, and preferences  [x]  It provides guidance to your family and loved ones and reduces their decisional burden about whether or not they are making the right decisions based on your wishes.  Follow the link provided in your after  visit summary or read over the paperwork we have mailed to you to help you started getting your Advance Directives in place. If you need assistance in completing these, please reach out to us  so that we can help you!

## 2024-01-25 NOTE — Progress Notes (Signed)
 Subjective:   Ernest James. is a 88 y.o. who presents for a Medicare Wellness preventive visit.  As a reminder, Annual Wellness Visits don't include a physical exam, and some assessments may be limited, especially if this visit is performed virtually. We may recommend an in-person follow-up visit with your provider if needed.  Visit Complete: In person   Persons Participating in Visit: Patient.  AWV Questionnaire: No: Patient Medicare AWV questionnaire was not completed prior to this visit.  Cardiac Risk Factors include: advanced age (>64men, >39 women);dyslipidemia;diabetes mellitus;hypertension;male gender;sedentary lifestyle     Objective:     Today's Vitals   01/25/24 1354  BP: (!) 156/70  Weight: 187 lb 6.4 oz (85 kg)  Height: 5' 9.5" (1.765 m)   Body mass index is 27.28 kg/m.     01/25/2024    2:10 PM 08/10/2023   10:08 AM 07/27/2023    9:10 AM 11/11/2022    3:48 PM 05/06/2022   11:37 AM 04/21/2022    7:07 AM 04/15/2022   10:00 AM  Advanced Directives  Does Patient Have a Medical Advance Directive? No  Yes Yes Yes No No  Type of Advance Directive  Living will;Healthcare Power of [employer] Power of [place];Living will  Living will    Copy of Healthcare Power of Attorney in Chart?  No - copy requested No - copy requested      Would patient like information on creating a medical advance directive? No - Patient declined     No - Patient declined No - Patient declined    Current Medications (verified) Outpatient Encounter Medications as of 01/25/2024  Medication Sig   acetaminophen  (TYLENOL ) 650 MG CR tablet Take 650 mg by mouth as needed for pain.   amLODipine  (NORVASC ) 5 MG tablet Take 1 tablet by mouth once daily   aspirin  EC 81 MG tablet Take 81 mg by mouth daily.   atorvastatin  (LIPITOR) 40 MG tablet Take 1 tablet by mouth once daily   carboxymethylcellulose (ARTIFICIAL TEARS) 1 % ophthalmic solution Apply 1 drop to eye 4 (four) times  daily.   Cholecalciferol (VITAMIN D3) 20 MCG TABS Take 2,000 Int'l Units by mouth daily.   clopidogrel  (PLAVIX ) 75 MG tablet Take 1 tablet by mouth once daily   Cyanocobalamin (VITAMIN B12) 1000 MCG TBCR Take 1 tablet by mouth daily.   finasteride  (PROSCAR ) 5 MG tablet Take 1 tablet (5 mg total) by mouth daily.   hydrochlorothiazide  (HYDRODIURIL ) 25 MG tablet Take 1 tablet by mouth once daily   levothyroxine  (SYNTHROID ) 75 MCG tablet Take 1 tablet (75 mcg total) by mouth daily before breakfast.   losartan  (COZAAR ) 50 MG tablet Take 75 mg by mouth daily.   metFORMIN  (GLUCOPHAGE -XR) 500 MG 24 hr tablet Take 1 tablet by mouth once daily with breakfast   polyethylene glycol powder (GLYCOLAX/MIRALAX) 17 GM/SCOOP powder Take 17 g by mouth daily as needed for mild constipation.   tamsulosin  (FLOMAX ) 0.4 MG CAPS capsule Take 1 capsule (0.4 mg total) by mouth daily after supper.   traZODone  (DESYREL ) 100 MG tablet TAKE 1 TABLET BY MOUTH AT BEDTIME (Patient taking differently: Take 50 mg by mouth at bedtime.)   triamcinolone  cream (KENALOG ) 0.1 % Apply 1 Application topically 2 (two) times daily.   omega-3 acid ethyl esters (LOVAZA) 1 g capsule Take 2 g by mouth 2 (two) times daily. (Patient not taking: Reported on 01/25/2024)   No facility-administered encounter medications on file as of 01/25/2024.  Allergies (verified) Patient has no known allergies.   History: Past Medical History:  Diagnosis Date   Arthritis    CAD (coronary artery disease)    Carotid artery stenosis    Celiac artery stenosis (HCC)    COVID-19 03/2023   GERD (gastroesophageal reflux disease)    Grade I diastolic dysfunction    Hypertension    Hypothyroidism    Mild cognitive impairment    Mild cognitive impairment    Mild mitral regurgitation by prior echocardiogram    Phimosis    Pre-diabetes    Repeated falls    no injuries   Sensorineural hearing loss, bilateral    Superior mesenteric artery stenosis (HCC)     TIA (transient ischemic attack) 08/2022   per neurology - "Possible TIA"   Wears hearing aid in both ears    Past Surgical History:  Procedure Laterality Date   CAROTID PTA/STENT INTERVENTION Left 04/14/2022   Procedure: CAROTID PTA/STENT INTERVENTION;  Surgeon: Jackquelyn Mass, MD;  Location: [facility] INVASIVE CV LAB;  Service: Cardiovascular;  Laterality: Left;   CATARACT EXTRACTION W/PHACO Left 07/27/2023   Procedure: CATARACT EXTRACTION PHACO AND INTRAOCULAR LENS PLACEMENT (IOC) LEFT DIABETIC  6.83  00:46.8;  Surgeon: Clair Crews, MD;  Location: [facility];  Service: Ophthalmology;  Laterality: Left;   CATARACT EXTRACTION W/PHACO Right 08/10/2023   Procedure: CATARACT EXTRACTION PHACO AND INTRAOCULAR LENS PLACEMENT (IOC) RIGHT  DIABETIC malyugin 8.13 00:58.0;  Surgeon: Clair Crews, MD;  Location: [facility];  Service: Ophthalmology;  Laterality: Right;   CIRCUMCISION     COLONOSCOPY WITH PROPOFOL  N/A 03/31/2016   Procedure: COLONOSCOPY WITH PROPOFOL ;  Surgeon: Deveron Fly, MD;  Location: [facility] END[facility]COPY;  Service: Endoscopy;  Laterality: N/A;   MRI     SINUS SURGERY  1994   on left side   TONSILLECTOMY     Family History  Problem Relation Age of Onset   Alzheimer's disease Mother    CAD Father    Diabetes Sister        type 2   Social History   Socioeconomic History   Marital status: Married    Spouse name: Not on file   Number of children: 3   Years of education: Not on file   Highest education level: Bachelor's degree (e.g., BA, AB, BS)  Occupational History   Occupation: Occupational hygienist    Comment: retired  Tobacco Use   Smoking status: Never    Passive exposure: Never   Smokeless tobacco: Never   Tobacco comments:    Smoked a little as a teenager  Vaping Use   Vaping status: Never Used  Substance and Sexual Activity   Alcohol use: No    Alcohol/week: 0.0 standard drinks of alcohol   Drug use: No   Sexual activity: Yes    Birth  control/protection: None  Other Topics Concern   Not on file  Social History Narrative   Not on file   Social Drivers of Health   Financial Resource Strain: Low Risk  (01/25/2024)   Overall Financial Resource Strain (CARDIA)    Difficulty of Paying Living Expenses: Not hard at all  Food Insecurity: No Food Insecurity (01/25/2024)   Hunger Vital Sign    Worried About Running Out of Food in the Last Year: Never true    Ran Out of Food in the Last Year: Never true  Transportation Needs: No Transportation Needs (01/25/2024)   PRAPARE - Administrator, Civil Service (Medical): No  Lack of Transportation (Non-Medical): No  Physical Activity: Insufficiently Active (01/25/2024)   Exercise Vital Sign    Days of Exercise per Week: 2 days    Minutes of Exercise per Session: 20 min  Stress: No Stress Concern Present (01/25/2024)   [employer] of Occupational Health - Occupational Stress Questionnaire    Feeling of Stress : Only a little  Social Connections: Socially Integrated (01/25/2024)   Social Connection and Isolation Panel [NHANES]    Frequency of Communication with Friends and Family: Three times a week    Frequency of Social Gatherings with Friends and Family: Once a week    Attends Religious Services: More than 4 times per year    Active Member of [employer] or Organizations: Yes    Attends Engineer, structural: More than 4 times per year    Marital Status: Married    Tobacco Counseling Counseling given: Not Answered Tobacco comments: Smoked a little as a teenager    Clinical Intake:  Pre-visit preparation completed: Yes  Pain : No/denies pain     BMI - recorded: 27.28 Nutritional Status: BMI 25 -29 Overweight Nutritional Risks: None Diabetes: Yes CBG done?: No Did pt. bring in CBG monitor from home?: No  Lab Results  Component Value Date   HGBA1C 6.4 (A) 04/23/2023   HGBA1C 6.1 (A) 12/04/2022   HGBA1C 6.5 (A) 08/03/2022     How often do  you need to have someone help you when you read instructions, pamphlets, or other written materials from your doctor or pharmacy?: 1 - Never  Interpreter Needed?: No  Information entered by :: Dellie Fergusson, LPN   Activities of Daily Living    01/25/2024    2:21 PM 08/10/2023   10:07 AM  In your present state of health, do you have any difficulty performing the following activities:  Hearing? 1 1  Vision? 0 0  Difficulty concentrating or making decisions? 0 0  Walking or climbing stairs? 1   Dressing or bathing? 0   Doing errands, shopping? 0   Preparing Food and eating ? N   Using the Toilet? N   In the past six months, have you accidently leaked urine? N   Do you have problems with loss of bowel control? N   Managing your Medications? N   Comment daughter double checks meds behind him   Managing your Finances? N   Housekeeping or managing your Housekeeping? N     Patient Care Team: Lamon Pillow, MD as PCP - General (Family Medicine) Anell Baptist, DPM as Referring Physician (Podiatry) Schnier, Ninette Basque, MD (Vascular Surgery) Bert Britain, PA-C (Orthopedic Surgery) Clair Crews, MD as Referring Physician (Ophthalmology) Luke Salaam, MD as Consulting Physician (Gastroenterology)  Indicate any recent Medical Services you may have received from other than [facility] providers in the past year (date may be approximate).     Assessment:    This is a routine wellness examination for [place].  Hearing/Vision screen Hearing Screening - Comments:: WEARS AIDS, BOTH EARS Vision Screening - Comments:: WEARS GLASSES ALL DAY- DR.PORFILIO   Goals Addressed             This Visit's Progress    DIET - REDUCE SUGAR INTAKE         Depression Screen     01/25/2024    2:07 PM 04/23/2023    4:02 PM 12/04/2022    2:14 PM 11/11/2022    3:34 PM 04/22/2022    9:46 AM 11/05/2021  9:45 AM 01/07/2021    2:03 PM  PHQ 2/9 Scores  PHQ - 2 Score 0 0 0 0 1 0 0  PHQ- 9 Score 0  0  7  0     Fall Risk     01/25/2024    2:11 PM 04/23/2023    4:02 PM 12/04/2022    2:14 PM 11/11/2022    3:18 PM 04/22/2022    9:46 AM  Fall Risk   Falls in the past year? 1 0 1 1 0  Number falls in past yr: 1 1 1 1  0  Injury with Fall? 0 1 0 0 0  Risk for fall due to : Impaired vision;Impaired balance/gait History of fall(s) History of fall(s) History of fall(s) No Fall Risks  Follow up Falls prevention discussed;Falls evaluation completed Falls evaluation completed Falls evaluation completed Education provided;Falls prevention discussed Falls evaluation completed    MEDICARE RISK AT HOME:  Medicare Risk at Home Any stairs in or around the home?: Yes If so, are there any without handrails?: No Home free of loose throw rugs in walkways, pet beds, electrical cords, etc?: Yes Adequate lighting in your home to reduce risk of falls?: Yes Life alert?: No Use of a cane, walker or w/c?: Yes (occasionally) Grab bars in the bathroom?: Yes Shower chair or bench in shower?: Yes Elevated toilet seat or a handicapped toilet?: Yes  TIMED UP AND GO:  Was the test performed?  Yes  Length of time to ambulate 10 feet: 5 sec Gait slow and steady without use of assistive device  Cognitive Function: 6CIT completed        01/25/2024    2:26 PM 11/11/2022    3:49 PM 04/04/2019   11:33 AM 03/31/2018    8:43 AM  6CIT Screen  What Year? 0 points 0 points 0 points 0 points  What month? 0 points 0 points 0 points 0 points  What time? 0 points 0 points 0 points 0 points  Count back from 20 0 points 0 points 0 points 0 points  Months in reverse 0 points 0 points 0 points 0 points  Repeat phrase 0 points 0 points 0 points 2 points  Total Score 0 points 0 points 0 points 2 points    Immunizations Immunization History  Administered Date(s) Administered   Fluad Quad(high Dose 65+) 05/30/2019, 07/02/2020, 06/12/2021   Influenza Split 07/24/2011   Influenza, High Dose Seasonal PF 06/27/2015, 05/27/2016,  06/19/2017, 07/05/2018   Influenza,inj,Quad PF,6+ Mos 07/08/2014   PFIZER Comirnaty(Gray Top)Covid-19 Tri-Sucrose Vaccine 09/23/2019, 10/17/2019, 01/07/2021   Pneumococcal Conjugate-13 06/27/2015   Pneumococcal Polysaccharide-23 06/30/2007   Td 06/07/2001   Tdap 07/08/2014   Zoster Recombinant(Shingrix) 11/01/2020, 02/06/2021   Zoster, Live 02/16/2012    Screening Tests Health Maintenance  Topic Date Due   Colonoscopy  03/31/2021   COVID-19 Vaccine (4 - 2024-25 season) 05/09/2023   HEMOGLOBIN A1C  10/24/2023   OPHTHALMOLOGY EXAM  11/06/2023   Diabetic kidney evaluation - Urine ACR  12/04/2023   INFLUENZA VACCINE  04/07/2024   Diabetic kidney evaluation - eGFR measurement  04/22/2024   DTaP/Tdap/Td (3 - Td or Tdap) 07/08/2024   Medicare Annual Wellness (AWV)  01/24/2025   Pneumonia Vaccine 68+ Years old  Completed   Zoster Vaccines- Shingrix  Completed   HPV VACCINES  Aged Out   Meningococcal B Vaccine  Aged Out    Health Maintenance  Health Maintenance Due  Topic Date Due   Colonoscopy  03/31/2021  COVID-19 Vaccine (4 - 2024-25 season) 05/09/2023   HEMOGLOBIN A1C  10/24/2023   OPHTHALMOLOGY EXAM  11/06/2023   Diabetic kidney evaluation - Urine ACR  12/04/2023   Health Maintenance Items Addressed: Needs another eye exam (has appt in July); ok on shots  Additional Screening:  Vision Screening: Recommended annual ophthalmology exams for early detection of glaucoma and other disorders of the eye.  Dental Screening: Recommended annual dental exams for proper oral hygiene  Community Resource Referral / Chronic Care Management: CRR required this visit?  No   CCM required this visit?  No   Plan:    I have personally reviewed and noted the following in the patient's chart:   Medical and social history Use of alcohol, tobacco or illicit drugs  Current medications and supplements including opioid prescriptions. Patient is not currently taking opioid  prescriptions. Functional ability and status Nutritional status Physical activity Advanced directives List of other physicians Hospitalizations, surgeries, and ER visits in previous 12 months Vitals Screenings to include cognitive, depression, and falls Referrals and appointments  In addition, I have reviewed and discussed with patient certain preventive protocols, quality metrics, and best practice recommendations. A written personalized care plan for preventive services as well as general preventive health recommendations were provided to patient.   Pinky Bright, LPN   [id number]   After Visit Summary: (In Person-Declined) Patient declined AVS at this time.  Notes: Nothing significant to report at this time.

## 2024-01-31 ENCOUNTER — Other Ambulatory Visit: Payer: Self-pay | Admitting: Family Medicine

## 2024-01-31 DIAGNOSIS — E119 Type 2 diabetes mellitus without complications: Secondary | ICD-10-CM

## 2024-02-11 DIAGNOSIS — M79675 Pain in left toe(s): Secondary | ICD-10-CM | POA: Diagnosis not present

## 2024-02-11 DIAGNOSIS — M109 Gout, unspecified: Secondary | ICD-10-CM | POA: Diagnosis not present

## 2024-02-17 DIAGNOSIS — H02889 Meibomian gland dysfunction of unspecified eye, unspecified eyelid: Secondary | ICD-10-CM | POA: Diagnosis not present

## 2024-02-17 DIAGNOSIS — M3501 Sicca syndrome with keratoconjunctivitis: Secondary | ICD-10-CM | POA: Diagnosis not present

## 2024-02-17 DIAGNOSIS — H35719 Central serous chorioretinopathy, unspecified eye: Secondary | ICD-10-CM | POA: Diagnosis not present

## 2024-02-17 DIAGNOSIS — E119 Type 2 diabetes mellitus without complications: Secondary | ICD-10-CM | POA: Diagnosis not present

## 2024-02-29 DIAGNOSIS — M79674 Pain in right toe(s): Secondary | ICD-10-CM | POA: Diagnosis not present

## 2024-02-29 DIAGNOSIS — M79675 Pain in left toe(s): Secondary | ICD-10-CM | POA: Diagnosis not present

## 2024-02-29 DIAGNOSIS — B351 Tinea unguium: Secondary | ICD-10-CM | POA: Diagnosis not present

## 2024-03-13 ENCOUNTER — Encounter: Admitting: Family Medicine

## 2024-03-30 DIAGNOSIS — G5601 Carpal tunnel syndrome, right upper limb: Secondary | ICD-10-CM | POA: Diagnosis not present

## 2024-04-20 ENCOUNTER — Other Ambulatory Visit: Payer: Self-pay | Admitting: Family Medicine

## 2024-04-20 DIAGNOSIS — I10 Essential (primary) hypertension: Secondary | ICD-10-CM

## 2024-04-20 NOTE — Telephone Encounter (Signed)
 Patient needs an appointment for chronic issues with Dr.Fisher

## 2024-05-10 ENCOUNTER — Other Ambulatory Visit: Payer: Self-pay | Admitting: Family Medicine

## 2024-05-10 ENCOUNTER — Ambulatory Visit (INDEPENDENT_AMBULATORY_CARE_PROVIDER_SITE_OTHER)

## 2024-05-10 ENCOUNTER — Ambulatory Visit: Payer: Self-pay | Admitting: Family Medicine

## 2024-05-10 ENCOUNTER — Ambulatory Visit
Admission: EM | Admit: 2024-05-10 | Discharge: 2024-05-10 | Disposition: A | Discharge status: Home / Self Care | Attending: Family Medicine | Admitting: Family Medicine

## 2024-05-10 DIAGNOSIS — S5002XA Contusion of left elbow, initial encounter: Secondary | ICD-10-CM

## 2024-05-10 DIAGNOSIS — M19022 Primary osteoarthritis, left elbow: Secondary | ICD-10-CM | POA: Diagnosis not present

## 2024-05-10 DIAGNOSIS — Z043 Encounter for examination and observation following other accident: Secondary | ICD-10-CM | POA: Diagnosis not present

## 2024-05-10 DIAGNOSIS — G4700 Insomnia, unspecified: Secondary | ICD-10-CM

## 2024-05-10 DIAGNOSIS — I10 Essential (primary) hypertension: Secondary | ICD-10-CM | POA: Diagnosis not present

## 2024-05-10 NOTE — Discharge Instructions (Signed)
 On my review of your xray images, you did not have any fractures or dislocated bones. The radiologist has not yet read your xray. If it is significantly abnormal or urgent, someone will contact you.  You should see your results in MyChart.   Monitor your blood pressures at home. Call your primary care provider, if they remain elevated.

## 2024-05-10 NOTE — ED Provider Notes (Signed)
 [facility]    CSN: [id number] Arrival date & time: 05/10/24  0841      History   Chief Complaint Chief Complaint  Patient presents with   Fall    HPI  HPI [facility] Ms Ernest James. is a 88 y.o. male.    History provided by patient and her daughter  Ernest James presents after a fall a week ago.  He was outside a store and tripped on the barrier in front of the car.  He has known balance  problems. The people in the store came out to help him up.  Denies hitting his head.  He landed on his left forearm.  There was a lot of swelling and bruising. Now, he has warmth to the left forearm and elbow.  The pain is getting better every day.  Royale takes blood thinners (Plavix  and aspirin ). Daughter is concerned for a blood clot and infection.     He has carpal tunnel surgery for his right arm on Monday.       Past Medical History:  Diagnosis Date   Arthritis    CAD (coronary artery disease)    Carotid artery stenosis    Celiac artery stenosis (HCC)    COVID-19 03/2023   GERD (gastroesophageal reflux disease)    Grade I diastolic dysfunction    Hypertension    Hypothyroidism    Mild cognitive impairment    Mild cognitive impairment    Mild mitral regurgitation by prior echocardiogram    Phimosis    Pre-diabetes    Repeated falls    no injuries   Sensorineural hearing loss, bilateral    Superior mesenteric artery stenosis (HCC)    TIA (transient ischemic attack) 08/2022   per neurology - Possible TIA   Wears hearing aid in both ears     Patient Active Problem List   Diagnosis Date Noted   Renal artery stenosis (HCC) 04/27/2022   Chronic mesenteric ischemia (HCC) 04/27/2022   Carotid stenosis, symptomatic w/o infarct, right 04/14/2022   OSA (obstructive sleep apnea) 01/24/2020   1st degree AV block 05/30/2019   Hyperlipidemia 12/16/2017   Insomnia 03/30/2017   BPH (benign prostatic hyperplasia) 05/27/2016   Carotid stenosis 11/27/2015   TIA  (transient ischemic attack) 11/27/2015   Allergic rhinitis, seasonal 11/22/2015   Family history of early CAD 11/22/2015   Phimosis 01/29/2014   Diabetes mellitus without complication (HCC) 01/19/2013   Abnormal liver enzymes 03/24/2009   Pure hyperglyceridemia 03/20/2009   Hypothyroidism 09/18/2008   History of adenomatous polyp of colon 09/07/2004   Primary hypertension 02/16/2002   Esophagitis, reflux 02/16/2002    Past Surgical History:  Procedure Laterality Date   CAROTID PTA/STENT INTERVENTION Left 04/14/2022   Procedure: CAROTID PTA/STENT INTERVENTION;  Surgeon: Jama Cordella MATSU, MD;  Location: [facility] INVASIVE CV LAB;  Service: Cardiovascular;  Laterality: Left;   CATARACT EXTRACTION W/PHACO Left 07/27/2023   Procedure: CATARACT EXTRACTION PHACO AND INTRAOCULAR LENS PLACEMENT (IOC) LEFT DIABETIC  6.83  00:46.8;  Surgeon: Jaye Fallow, MD;  Location: [facility];  Service: Ophthalmology;  Laterality: Left;   CATARACT EXTRACTION W/PHACO Right 08/10/2023   Procedure: CATARACT EXTRACTION PHACO AND INTRAOCULAR LENS PLACEMENT (IOC) RIGHT  DIABETIC malyugin 8.13 00:58.0;  Surgeon: Jaye Fallow, MD;  Location: [facility];  Service: Ophthalmology;  Laterality: Right;   CIRCUMCISION     COLONOSCOPY WITH PROPOFOL  N/A 03/31/2016   Procedure: COLONOSCOPY WITH PROPOFOL ;  Surgeon: Gladis RAYMOND Mariner, MD;  Location: [facility];  Service: Endoscopy;  Laterality: N/A;   MRI     SINUS SURGERY  1994   on left side   TONSILLECTOMY         Home Medications    Prior to Admission medications   Medication Sig Start Date End Date Taking? Authorizing Provider  amLODipine  (NORVASC ) 5 MG tablet Take 1 tablet by mouth once daily 12/26/23  Yes Gasper Nancyann BRAVO, MD  aspirin  EC 81 MG tablet Take 81 mg by mouth daily.   Yes [provider]  atorvastatin  (LIPITOR) 40 MG tablet Take 1 tablet by mouth once daily 05/13/23  Yes Fisher, Nancyann BRAVO, MD  clopidogrel  (PLAVIX ) 75 MG  tablet Take 1 tablet by mouth once daily 12/13/23  Yes Schnier, Cordella MATSU, MD  finasteride  (PROSCAR ) 5 MG tablet Take 1 tablet (5 mg total) by mouth daily. 01/19/24  Yes Francisca Redell BROCKS, MD  hydrochlorothiazide  (HYDRODIURIL ) 25 MG tablet Take 1 tablet by mouth once daily 04/20/24  Yes Gasper Nancyann BRAVO, MD  levothyroxine  (SYNTHROID ) 75 MCG tablet Take 1 tablet (75 mcg total) by mouth daily before breakfast. 12/29/23  Yes Gasper Nancyann BRAVO, MD  losartan  (COZAAR ) 50 MG tablet Take 75 mg by mouth daily. 04/01/18  Yes [provider]  metFORMIN  (GLUCOPHAGE -XR) 500 MG 24 hr tablet Take 1 tablet by mouth once daily with breakfast 01/31/24  Yes Fisher, Nancyann BRAVO, MD  tamsulosin  (FLOMAX ) 0.4 MG CAPS capsule Take 1 capsule (0.4 mg total) by mouth daily after supper. 01/19/24  Yes Francisca Redell BROCKS, MD  traZODone  (DESYREL ) 100 MG tablet TAKE 1 TABLET BY MOUTH AT BEDTIME Patient taking differently: Take 50 mg by mouth at bedtime. 06/01/23  Yes Gasper Nancyann BRAVO, MD  acetaminophen  (TYLENOL ) 650 MG CR tablet Take 650 mg by mouth as needed for pain.    [provider]  carboxymethylcellulose (ARTIFICIAL TEARS) 1 % ophthalmic solution Apply 1 drop to eye 4 (four) times daily.    [provider]  Cholecalciferol (VITAMIN D3) 20 MCG TABS Take 2,000 Int'l Units by mouth daily.    [provider]  Cyanocobalamin (VITAMIN B12) 1000 MCG TBCR Take 1 tablet by mouth daily.    [provider]  omega-3 acid ethyl esters (LOVAZA) 1 g capsule Take 2 g by mouth 2 (two) times daily. Patient not taking: Reported on 01/25/2024    [provider]  polyethylene glycol powder (GLYCOLAX/MIRALAX) 17 GM/SCOOP powder Take 17 g by mouth daily as needed for mild constipation.    [provider]  triamcinolone  cream (KENALOG ) 0.1 % Apply 1 Application topically 2 (two) times daily. 09/11/23   [provider]    Family History Family History  Problem Relation Age of Onset    Alzheimer's disease Mother    CAD Father    Diabetes Sister        type 2    Social History Social History   Tobacco Use   Smoking status: Never    Passive exposure: Never   Smokeless tobacco: Never   Tobacco comments:    Smoked a little as a teenager  Vaping Use   Vaping status: Never Used  Substance Use Topics   Alcohol use: No    Alcohol/week: 0.0 standard drinks of alcohol   Drug use: No     Allergies   Patient has no known allergies.   Review of Systems Review of Systems: :negative unless otherwise stated in HPI.      Physical Exam Triage Vital Signs ED Triage Vitals  Encounter Vitals Group     BP 05/10/24 0905 (!) 172/80     Girls Systolic BP Percentile --      Girls Diastolic BP Percentile --      Boys Systolic BP Percentile --      Boys Diastolic BP Percentile --      Pulse Rate 05/10/24 0905 70     Resp 05/10/24 0905 17     Temp 05/10/24 0905 98.6 F (37 C)     Temp Source 05/10/24 0905 Oral     SpO2 05/10/24 0905 95 %     Weight 05/10/24 0904 190 lb (86.2 kg)     Height --      Head Circumference --      Peak Flow --      Pain Score 05/10/24 0904 2     Pain Loc --      Pain Education --      Exclude from Growth Chart --    No data found.  Updated Vital Signs BP (!) 166/81 (BP Location: Right Arm)   Pulse 70   Temp 98.6 F (37 C) (Oral)   Resp 17   Wt 86.2 kg   SpO2 95%   BMI 27.66 kg/m   Visual Acuity Right Eye Distance:   Left Eye Distance:   Bilateral Distance:    Right Eye Near:   Left Eye Near:    Bilateral Near:     Physical Exam GEN: well appearing male in no acute distress  CVS: well perfused, regular rate and rhythm RESP: speaking in full sentences without pause, no respiratory distress, clear bilaterally  MSK:  Left upper extremity: Inspection yields no evidence of bony deformity, effusion. He has erythema with large ecchymosis. Active and passive ROM intact in flexion/extension/supination/pronation. Strength 5/5  throughout. No TTP at the medial or lateral epicondyle. Mild olecranon tenderness. No pain with finger/wrist extension against resistance. No pain with gripping or finger/wrist flexion against resistance. No evidence of pain or laxity at the UCL. Normal ROM of left wrist. No metacarpal bony TTP. Normal flexion and extension of fingers and thumb. No bony shoulder tenderness.     UC Treatments / Results  Labs (all labs ordered are listed, but only abnormal results are displayed) Labs Reviewed - No data to display  EKG   Radiology DG Elbow Complete Left Result Date: 05/10/2024 CLINICAL DATA:  Fall, bruising. EXAM: LEFT ELBOW - COMPLETE 3+ VIEW COMPARISON:  None Available. FINDINGS: No acute osseous or joint abnormality. Degenerative changes the elbow joint. Soft tissue swelling over the olecranon. IMPRESSION: 1. Soft tissue swelling over the olecranon. No fracture or joint effusion. 2. Degenerative changes in the elbow joint. Electronically Signed   By: Newell Eke M.D.   On: 05/10/2024 10:41     Procedures Procedures (including critical care time)  Medications Ordered in UC Medications - No data to display  Initial Impression / Assessment and Plan / UC Course  I have reviewed the triage vital signs and the nursing notes.  Pertinent labs & imaging results that were available during my care of the patient were reviewed by me and considered in my medical decision making (see chart for details).      Pt is a 88 y.o.  male with 1 week of left forearm and elbow bruising with warmth and resolving pain. He is afebrile and satting well on room air.  He is hypertensive here.  Pt takes his blood pressure medication. Daughter notes that one of  his BP medications was lowered. Pt advised to follow up with his PCP.  BP 172/80 and after seating was 166/81.    On exam, pt has tenderness at olecranon concerning for possible fracture after his fall.   Obtained left elbow plain films.  Personally  interpreted by me were unremarkable for fracture or dislocation. He does have some age related changes of the elbow and possible some vascular calcifications. Reassurance provided as films reviewed with patient and his daughter.  Patient aware the radiologist has not read his xray and is comfortable with the preliminary read by me. Will review radiologist read when available and call patient if a change in plan is warranted.  Pt agreeable to this plan prior to discharge.   Patient to gradually return to normal activities, as tolerated and continue ordinary activities within the limits permitted by pain.Warm compresses and Voltaren  gel.  Tylenol  PRN.   Patient to follow up with orthopedic provider, if symptoms do not improve with conservative treatment.  Return and ED precautions given. Understanding voiced. Discussed MDM, treatment plan and plan for follow-up with patient and his daughter who agree with plan.   Radiologist report reviewed and no change in plan warranted.   Final Clinical Impressions(s) / UC Diagnoses   Final diagnoses:  Contusion of left elbow, initial encounter  Elevated blood pressure reading with diagnosis of hypertension     Discharge Instructions      On my review of your xray images, you did not have any fractures or dislocated bones. The radiologist has not yet read your xray. If it is significantly abnormal or urgent, someone will contact you.  You should see your results in MyChart.   Monitor your blood pressures at home. Call your primary care provider, if they remain elevated.       ED Prescriptions   None    PDMP not reviewed this encounter.   Sabastien Tyler, DO 05/10/24 1717

## 2024-05-10 NOTE — ED Triage Notes (Signed)
 Patient states that he fell about a week ago. Injuring his left forearm and elbow. Patient did not hit his head. Patient is on blood thinners. Patient states that the area is warm to the touch.

## 2024-05-18 ENCOUNTER — Other Ambulatory Visit: Payer: Self-pay | Admitting: Family Medicine

## 2024-05-18 DIAGNOSIS — E782 Mixed hyperlipidemia: Secondary | ICD-10-CM

## 2024-05-24 ENCOUNTER — Ambulatory Visit (INDEPENDENT_AMBULATORY_CARE_PROVIDER_SITE_OTHER): Admitting: Family Medicine

## 2024-05-24 ENCOUNTER — Encounter: Payer: Self-pay | Admitting: Family Medicine

## 2024-05-24 VITALS — BP 134/65 | HR 70 | Ht 69.0 in | Wt 193.5 lb

## 2024-05-24 DIAGNOSIS — E782 Mixed hyperlipidemia: Secondary | ICD-10-CM

## 2024-05-24 DIAGNOSIS — E1159 Type 2 diabetes mellitus with other circulatory complications: Secondary | ICD-10-CM | POA: Diagnosis not present

## 2024-05-24 DIAGNOSIS — I10 Essential (primary) hypertension: Secondary | ICD-10-CM | POA: Diagnosis not present

## 2024-05-24 DIAGNOSIS — I6521 Occlusion and stenosis of right carotid artery: Secondary | ICD-10-CM | POA: Diagnosis not present

## 2024-05-24 DIAGNOSIS — Z1331 Encounter for screening for depression: Secondary | ICD-10-CM | POA: Diagnosis not present

## 2024-05-24 DIAGNOSIS — R2689 Other abnormalities of gait and mobility: Secondary | ICD-10-CM | POA: Diagnosis not present

## 2024-05-24 DIAGNOSIS — G5601 Carpal tunnel syndrome, right upper limb: Secondary | ICD-10-CM | POA: Diagnosis not present

## 2024-05-24 DIAGNOSIS — E781 Pure hyperglyceridemia: Secondary | ICD-10-CM | POA: Diagnosis not present

## 2024-05-24 DIAGNOSIS — M542 Cervicalgia: Secondary | ICD-10-CM | POA: Diagnosis not present

## 2024-05-24 DIAGNOSIS — I152 Hypertension secondary to endocrine disorders: Secondary | ICD-10-CM | POA: Diagnosis not present

## 2024-05-24 DIAGNOSIS — E039 Hypothyroidism, unspecified: Secondary | ICD-10-CM | POA: Diagnosis not present

## 2024-05-24 DIAGNOSIS — E119 Type 2 diabetes mellitus without complications: Secondary | ICD-10-CM

## 2024-05-24 MED ORDER — CLOPIDOGREL BISULFATE 75 MG PO TABS: 75.0000 mg | ORAL_TABLET | Freq: Every day | ORAL | 3 refills | Status: DC

## 2024-05-24 NOTE — Patient Instructions (Signed)
 Ernest James  Please review the attached list of medications and notify my office if there are any errors.   . Please bring all of your medications to every appointment so we can make sure that our medication list is the same as yours.

## 2024-05-24 NOTE — Progress Notes (Signed)
 Established patient visit   Patient: Ernest James.   DOB: [birth year]   88 y.o. Male  MRN: [id number] Visit Date: 05/24/2024  Today's healthcare provider: Nancyann Perry, MD   Chief Complaint  Patient presents with   Medical Management of Chronic Issues    Patient reports taking all medications as prescribed with no side effects to report. He reports upcoming carpal tunnel surgery on right hand. He reports no symptoms. No smoking. He reports he does not monitor blood sugar at home but he does monitor blood pressure at home on occasions. Diabetic eye exam requested.   Subjective    Discussed the use of AI scribe software for clinical note transcription with the patient, who gave verbal consent to proceed.  History of Present Illness   Ernest Herschell Dj Senteno. is an 88 year old male who presents for follow up hypertension, hyperlipidemia, hypothyroidism and type 2 diabetes and reporting balance issues.  He has been experiencing ongoing balance difficulties, resulting in several falls, the most recent of which occurred three weeks ago. During this fall, he sustained injuries to his arm and elbow, leading to swelling and subcutaneous bleeding. He was not using his cane at the time and tripped over a concrete barrier outside a store.  He has been in contact with his neurologist, who discussed exercises to improve his balance and referred him to physical therapy, particularly focusing on balance issues. He notes feeling more stable when carrying something or pulling a trash can.  He is currently taking medications for blood pressure, thyroid , and cholesterol, including atorvastatin , with no reported issues. He also takes Plavix  and aspirin , preferring a 90-day supply of Plavix . He occasionally checks his blood pressure at home, noting it is often lower when sitting for a while. A recent reading at urgent care following his fall was 160/90, which he attributes to a possible  'white coat complex'.  He underwent carotid artery surgery in the past, which initially caused swelling that has since resolved. He routinely sees his cardiologist but has not seen him in a long time.       Medications: Outpatient Medications Prior to Visit  Medication Sig   acetaminophen  (TYLENOL ) 650 MG CR tablet Take 650 mg by mouth as needed for pain.   amLODipine  (NORVASC ) 5 MG tablet Take 1 tablet by mouth once daily   aspirin  EC 81 MG tablet Take 81 mg by mouth daily.   atorvastatin  (LIPITOR) 40 MG tablet Take 1 tablet by mouth once daily   carboxymethylcellulose (ARTIFICIAL TEARS) 1 % ophthalmic solution Apply 1 drop to eye 4 (four) times daily.   Cholecalciferol (VITAMIN D3) 20 MCG TABS Take 2,000 Int'l Units by mouth daily.   Cyanocobalamin (VITAMIN B12) 1000 MCG TBCR Take 1 tablet by mouth daily.   finasteride  (PROSCAR ) 5 MG tablet Take 1 tablet (5 mg total) by mouth daily.   hydrochlorothiazide  (HYDRODIURIL ) 25 MG tablet Take 1 tablet by mouth once daily   levothyroxine  (SYNTHROID ) 75 MCG tablet Take 1 tablet (75 mcg total) by mouth daily before breakfast.   losartan  (COZAAR ) 50 MG tablet Take 75 mg by mouth daily.   metFORMIN  (GLUCOPHAGE -XR) 500 MG 24 hr tablet Take 1 tablet by mouth once daily with breakfast   polyethylene glycol powder (GLYCOLAX/MIRALAX) 17 GM/SCOOP powder Take 17 g by mouth daily as needed for mild constipation.   tamsulosin  (FLOMAX ) 0.4 MG CAPS capsule Take 1 capsule (0.4 mg total) by mouth daily after supper.  traZODone  (DESYREL ) 100 MG tablet Take 1 tablet (100 mg total) by mouth at bedtime.   [DISCONTINUED] clopidogrel  (PLAVIX ) 75 MG tablet Take 1 tablet by mouth once daily   omega-3 acid ethyl esters (LOVAZA) 1 g capsule Take 2 g by mouth 2 (two) times daily. (Patient not taking: Reported on 05/24/2024)   triamcinolone  cream (KENALOG ) 0.1 % Apply 1 Application topically 2 (two) times daily. (Patient not taking: Reported on 05/24/2024)   No  facility-administered medications prior to visit.   Review of Systems  Constitutional:  Negative for appetite change, chills and fever.  Respiratory:  Negative for chest tightness, shortness of breath and wheezing.   Cardiovascular:  Negative for chest pain and palpitations.  Gastrointestinal:  Negative for abdominal pain, nausea and vomiting.       Objective    BP 134/65 (BP Location: Right Arm, Patient Position: Sitting, Cuff Size: Normal)   Pulse 70   Ht 5' 9 (1.753 m)   Wt 193 lb 8 oz (87.8 kg)   SpO2 98%   BMI 28.57 kg/m   Physical Exam   General: Appearance:    Well developed, well nourished male in no acute distress  Eyes:    PERRL, conjunctiva/corneas clear, EOM's intact       Lungs:     Clear to auscultation bilaterally, respirations unlabored  Heart:    Normal heart rate. Normal rhythm. No murmurs, rubs, or gallops.    MS:   All extremities are intact.    Neurologic:   Awake, alert, oriented x 3. Slow to stand and difficulty stepping up to exam table. Slightly ataxic gait.        Assessment & Plan    1. Diabetes mellitus without complication (HCC) (Primary) Doing well current medications.  - Hemoglobin A1c  2. Hypertension associated with diabetes (HCC) BP near goal. Home pressures often lower - CBC - Comprehensive metabolic panel with GFR  3. Pure hyperglyceridemia   4. Mixed hyperlipidemia He is tolerating atorvastatin  well with no adverse effects.   - Lipid panel  5. Hypothyroidism, unspecified type Tolerating levothyroxine  well.  - T4 AND TSH  6.  Carotid stenosis, symptomatic w/o infarct, right Refill clopidogrel  (PLAVIX ) 75 MG tablet; Take 1 tablet (75 mg total) by mouth daily.  Dispense: 90 tablet; Refill: 3  Continue regular follow up Dr. Jama.   8. Balance problem Continue regular follow up neurology and with physical therapy as ordered by Dr. Maree today.   He is planning on getting Covid and flu vaccines in October, possibly at  pharmacy.   Return in about 6 months (around 11/21/2024) for Diabetes.     Nancyann Perry, MD  [facility] [phone] (phone) [phone] (fax)  [facility]

## 2024-05-25 ENCOUNTER — Ambulatory Visit: Payer: Self-pay | Admitting: Family Medicine

## 2024-05-25 LAB — COMPREHENSIVE METABOLIC PANEL WITH GFR
ALT: 27 IU/L (ref 0–44)
AST: 26 IU/L (ref 0–40)
Albumin: 4.4 g/dL (ref 3.7–4.7)
Alkaline Phosphatase: 55 IU/L (ref 48–129)
BUN/Creatinine Ratio: 20 (ref 10–24)
BUN: 18 mg/dL (ref 8–27)
Bilirubin Total: 0.5 mg/dL (ref 0.0–1.2)
CO2: 25 mmol/L (ref 20–29)
Calcium: 10.5 mg/dL — ABNORMAL HIGH (ref 8.6–10.2)
Chloride: 99 mmol/L (ref 96–106)
Creatinine, Ser: 0.9 mg/dL (ref 0.76–1.27)
Globulin, Total: 2.7 g/dL (ref 1.5–4.5)
Glucose: 121 mg/dL — ABNORMAL HIGH (ref 70–99)
Potassium: 3.9 mmol/L (ref 3.5–5.2)
Sodium: 138 mmol/L (ref 134–144)
Total Protein: 7.1 g/dL (ref 6.0–8.5)
eGFR: 83 mL/min/1.73 (ref >59)

## 2024-05-25 LAB — CBC
Hematocrit: 41.5 % (ref 37.5–51.0)
Hemoglobin: 14.3 g/dL (ref 13.0–17.7)
MCH: 32.4 pg (ref 26.6–33.0)
MCHC: 34.5 g/dL (ref 31.5–35.7)
MCV: 94 fL (ref 79–97)
Platelets: 306 x10E3/uL (ref 150–450)
RBC: 4.42 x10E6/uL (ref 4.14–5.80)
RDW: 12.7 % (ref 11.6–15.4)
WBC: 8.2 x10E3/uL (ref 3.4–10.8)

## 2024-05-25 LAB — LIPID PANEL
Chol/HDL Ratio: 2.6 ratio (ref 0.0–5.0)
Cholesterol, Total: 115 mg/dL (ref 100–199)
HDL: 44 mg/dL (ref >39)
LDL Chol Calc (NIH): 39 mg/dL (ref 0–99)
Triglycerides: 203 mg/dL — ABNORMAL HIGH (ref 0–149)
VLDL Cholesterol Cal: 32 mg/dL (ref 5–40)

## 2024-05-25 LAB — T4 AND TSH
T4, Total: 7.7 ug/dL (ref 4.5–12.0)
TSH: 2.94 u[IU]/mL (ref 0.450–4.500)

## 2024-05-25 LAB — HEMOGLOBIN A1C
Est. average glucose Bld gHb Est-mCnc: 143 mg/dL
Hgb A1c MFr Bld: 6.6 % — ABNORMAL HIGH (ref 4.8–5.6)

## 2024-05-29 DIAGNOSIS — G5601 Carpal tunnel syndrome, right upper limb: Secondary | ICD-10-CM | POA: Diagnosis not present

## 2024-05-30 ENCOUNTER — Telehealth: Payer: Self-pay | Admitting: Family Medicine

## 2024-05-30 NOTE — Telephone Encounter (Signed)
[facility] faxed refill request for the following medications:  losartan (COZAAR) 50 MG tablet    Please advise.  

## 2024-05-31 ENCOUNTER — Other Ambulatory Visit: Payer: Self-pay

## 2024-05-31 MED ORDER — LOSARTAN POTASSIUM 50 MG PO TABS: 75.0000 mg | ORAL_TABLET | Freq: Every day | ORAL | 1 refills | Status: DC

## 2024-06-01 ENCOUNTER — Other Ambulatory Visit: Payer: Self-pay | Admitting: Family Medicine

## 2024-06-01 NOTE — Telephone Encounter (Unsigned)
 Copied from CRM [id number]. Topic: Clinical - Medication Refill >> Jun 01, 2024  4:49 PM Debby BROCKS wrote: Medication:  losartan  (COZAAR ) 50 MG tablet Patient would like to receive 135 tablets as well due to taking 1.5 tablets as a new dosage so he may have a 90 day supply. 90 tablets only lasts him 60 days  Has the patient contacted their pharmacy? Yes (Agent: If no, request that the patient contact the pharmacy for the refill. If patient does not wish to contact the pharmacy document the reason why and proceed with request.) (Agent: If yes, when and what did the pharmacy advise?)  This is the patient's preferred pharmacy:  [facility] [address], [place] - [address] [place] [address] Phone: [phone] Fax: [phone]  Is this the correct pharmacy for this prescription? Yes If no, delete pharmacy and type the correct one.   Has the prescription been filled recently? Yes  Is the patient out of the medication? No, will be running out next week  Has the patient been seen for an appointment in the last year OR does the patient have an upcoming appointment? Yes  Can we respond through MyChart? Yes  Agent: Please be advised that Rx refills may take up to 3 business days. We ask that you follow-up with your pharmacy.

## 2024-06-01 NOTE — Telephone Encounter (Signed)
  losartan  (COZAAR ) 50 MG tablet Patient would like to receive 135 tablets as well due to taking 1.5 tablets as a new dosage so he may have a 90 day supply. 90 tablets only lasts him 60 days

## 2024-06-02 NOTE — Telephone Encounter (Signed)
 Requested medications are due for refill today.  no  Requested medications are on the active medications list.  yes  Last refill. 05/31/2024 #90 1 rf  Future visit scheduled.   yes  Notes to clinic.  Pt is requesting that rx be changed to 135 tablet which would be a 90 day supply    Requested Prescriptions  Pending Prescriptions Disp Refills   losartan  (COZAAR ) 50 MG tablet 90 tablet 1    Sig: Take 1.5 tablets (75 mg total) by mouth daily.     Cardiovascular:  Angiotensin Receptor Blockers Passed - 06/02/2024  3:13 PM      Passed - Cr in normal range and within 180 days    Creatinine, Ser  Date Value Ref Range Status  05/24/2024 0.90 0.76 - 1.27 mg/dL Final         Passed - K in normal range and within 180 days    Potassium  Date Value Ref Range Status  05/24/2024 3.9 3.5 - 5.2 mmol/L Final         Passed - Patient is not pregnant      Passed - Last BP in normal range    BP Readings from Last 1 Encounters:  05/24/24 134/65         Passed - Valid encounter within last 6 months    Recent Outpatient Visits           1 week ago Diabetes mellitus without complication [facility])   [facility] Gasper Nancyann BRAVO, MD       Future Appointments             In 7 months Francisca, Redell BROCKS, MD [facility] [place]

## 2024-07-10 ENCOUNTER — Other Ambulatory Visit: Payer: Self-pay | Admitting: Family Medicine

## 2024-07-10 DIAGNOSIS — G4700 Insomnia, unspecified: Secondary | ICD-10-CM

## 2024-07-13 DIAGNOSIS — Z23 Encounter for immunization: Secondary | ICD-10-CM | POA: Diagnosis not present

## 2024-07-15 ENCOUNTER — Other Ambulatory Visit: Payer: Self-pay | Admitting: Family Medicine

## 2024-07-15 DIAGNOSIS — I10 Essential (primary) hypertension: Secondary | ICD-10-CM

## 2024-07-20 ENCOUNTER — Ambulatory Visit

## 2024-07-20 ENCOUNTER — Ambulatory Visit
Admission: EM | Admit: 2024-07-20 | Discharge: 2024-07-20 | Disposition: A | Discharge status: Home / Self Care | Attending: Emergency Medicine | Admitting: Emergency Medicine

## 2024-07-20 ENCOUNTER — Ambulatory Visit: Payer: Self-pay

## 2024-07-20 DIAGNOSIS — R0789 Other chest pain: Secondary | ICD-10-CM | POA: Diagnosis not present

## 2024-07-20 DIAGNOSIS — W19XXXA Unspecified fall, initial encounter: Secondary | ICD-10-CM

## 2024-07-20 MED ORDER — TRAMADOL HCL 50 MG PO TABS: 50.0000 mg | ORAL_TABLET | Freq: Four times a day (QID) | ORAL | 0 refills | Status: AC | PRN

## 2024-07-20 NOTE — Telephone Encounter (Signed)
 Daughter states she has to take her Ernest James to an appointment today in [place] and there is an Urgent Care near there where she will take this patient to for further assessment. They are aware if anything worsens for patient to go to the Emergency Room  FYI Only or Action Required?: Action required by provider: clinical question for provider, update on patient condition, and patient going to Urgent Care today.  Patient was last seen in primary care on 05/24/2024 by Gasper Nancyann BRAVO, MD.  Called Nurse Triage reporting Chest Injury.  Symptoms began two weeks ago.  Interventions attempted: OTC medications: tylenol  and Rest, hydration, or home remedies.  Symptoms are: gradually worsening.  Triage Disposition: See HCP Within 4 Hours (Or PCP Triage)  Patient/caregiver understands and will follow disposition?: Yes---Urgent Care is where they decided to go today                  Copied from CRM #[id number]. Topic: Clinical - Red Word Triage >> Jul 20, 2024  1:45 PM Delon HERO wrote: Red Word that prompted transfer to Nurse Triage: Patient is calling to report that he fell about a week ago on right shoulder. Report pain with certain movements requesting an order for x ray. Advised appointment with be needed. In agreement. Reason for Disposition  [1] Collarbone is painful AND [2] can't move injured shoulder normally (e.g., able to touch top of head, good range of motion)    Bruising and impact point is at this collar bone area---patient does have good range of motion he states but given medical history it is recommended by this RN that he be evaluated today.  Answer Assessment - Initial Assessment Questions Fall on right side two weeks ago 4-5 days ago it began to hurt worse Patient states pain worse with certain movements and no difficulty taking a deep breath Tylenol  helps sometimes Impact just above collar bone  Bruising under collar bone Patient does take blood  thinners Patient denies difficulty breathing and only has pain with certain motions States no trouble moving right shoulder or arm This RN advised them that it's recommended that the patient is seen today given medical history and with the pain getting worse.  No appointments with Dr Gasper available today but the daughter is concerned and wants the patient (her father) to be evaluated and possibly get an Xray. This RN advised them that if anything gets worse at all for him to go immediately to the Emergency Room but it is encouraged that he be seen and evaluated by a provider today Patient's daughter states that she is going to take her Ernest James to a different  appointment today and there is an Urgent Care right next to that appointment and they are going to get this patient seen at Urgent Care today. They are advised to call us  back with any questions or concerns. They verbalized understanding.       1. MECHANISM: How did the injury happen?     Fell on right side 2. ONSET: When did the injury happen? (.e.g., minutes, hours, days ago)     Fall on arm of a chair 3. LOCATION: Where on the chest is the injury located?     Right side of upper chest 4. APPEARANCE: What does the injury look like?     bruised 5. BLEEDING: Is there any bleeding now? If Yes, ask: How long has it been bleeding?     Bruising has gotten worse 6. SEVERITY: Any difficulty with  breathing?     ---- 7. SIZE: For cuts, bruises, or swelling, ask: How large is it? (e.g., inches or centimeters)     ----- 8. PAIN: Is there pain? If Yes, ask: How bad is the pain? (e.g., Scale 0-10; none, mild, moderate, severe)  Protocols used: Chest Injury-A-AH

## 2024-07-20 NOTE — ED Provider Notes (Signed)
 [facility]    CSN: [id number] Arrival date & time: 07/20/24  1501      History   Chief Complaint Chief Complaint  Patient presents with   Chest Pain    HPI [facility] Ernest James. is a 88 y.o. male.   Patient presents for right sided chest wall pain after fall that occurred 2 weeks ago.  Patient states that he tripped over a wire in his home and his chest directly hit the arm of a chair he has had pain to this area since.  Patient states that he did have a significant amount of bruising that has been slowly healing over the last 2 weeks.    Patient states that at times when he makes certain movements he also has some pain that radiates to his right upper back as well.  Patient denies any shortness of breath or pain with deep breathing.  Patient also denies any other injuries from this fall.  Patient denies hitting his head or loss of consciousness.    Patient does currently take Plavix .  Patient reports that he has been taking Tylenol  with some relief of his pain.  The history is provided by the patient and medical records.  Chest Pain   Past Medical History:  Diagnosis Date   Arthritis    CAD (coronary artery disease)    Carotid artery stenosis    Celiac artery stenosis    COVID-19 03/2023   GERD (gastroesophageal reflux disease)    Grade I diastolic dysfunction    Hypertension    Hypothyroidism    Mild cognitive impairment    Mild cognitive impairment    Mild mitral regurgitation by prior echocardiogram    Phimosis    Pre-diabetes    Repeated falls    no injuries   Sensorineural hearing loss, bilateral    Superior mesenteric artery stenosis    TIA (transient ischemic attack) 08/2022   per neurology - Possible TIA   Wears hearing aid in both ears     Patient Active Problem List   Diagnosis Date Noted   Renal artery stenosis 04/27/2022   Chronic mesenteric ischemia 04/27/2022   Carotid stenosis, symptomatic w/o infarct, right 04/14/2022    OSA (obstructive sleep apnea) 01/24/2020   1st degree AV block 05/30/2019   Hyperlipidemia 12/16/2017   Insomnia 03/30/2017   BPH (benign prostatic hyperplasia) 05/27/2016   Carotid stenosis 11/27/2015   TIA (transient ischemic attack) 11/27/2015   Allergic rhinitis, seasonal 11/22/2015   Family history of early CAD 11/22/2015   Phimosis 01/29/2014   Diabetes mellitus without complication (HCC) 01/19/2013   Abnormal liver enzymes 03/24/2009   Pure hyperglyceridemia 03/20/2009   Hypothyroidism 09/18/2008   History of adenomatous polyp of colon 09/07/2004   Primary hypertension 02/16/2002   Esophagitis, reflux 02/16/2002    Past Surgical History:  Procedure Laterality Date   CAROTID PTA/STENT INTERVENTION Left 04/14/2022   Procedure: CAROTID PTA/STENT INTERVENTION;  Surgeon: Jama Cordella MATSU, MD;  Location: [facility] INVASIVE CV LAB;  Service: Cardiovascular;  Laterality: Left;   CATARACT EXTRACTION W/PHACO Left 07/27/2023   Procedure: CATARACT EXTRACTION PHACO AND INTRAOCULAR LENS PLACEMENT (IOC) LEFT DIABETIC  6.83  00:46.8;  Surgeon: Jaye Fallow, MD;  Location: [facility];  Service: Ophthalmology;  Laterality: Left;   CATARACT EXTRACTION W/PHACO Right 08/10/2023   Procedure: CATARACT EXTRACTION PHACO AND INTRAOCULAR LENS PLACEMENT (IOC) RIGHT  DIABETIC malyugin 8.13 00:58.0;  Surgeon: Jaye Fallow, MD;  Location: [facility];  Service: Ophthalmology;  Laterality:  Right;   CIRCUMCISION     COLONOSCOPY WITH PROPOFOL  N/A 03/31/2016   Procedure: COLONOSCOPY WITH PROPOFOL ;  Surgeon: Gladis RAYMOND Mariner, MD;  Location: [facility] ENDOSCOPY;  Service: Endoscopy;  Laterality: N/A;   MRI     SINUS SURGERY  1994   on left side   TONSILLECTOMY         Home Medications    Prior to Admission medications   Medication Sig Start Date End Date Taking? Authorizing Provider  traMADol (ULTRAM) 50 MG tablet Take 1 tablet (50 mg total) by mouth every 6 (six) hours as needed for  severe pain (pain score 7-10). 07/20/24  Yes Johnie, Novah Goza A, NP  acetaminophen  (TYLENOL ) 650 MG CR tablet Take 650 mg by mouth as needed for pain.    [provider]  amLODipine  (NORVASC ) 5 MG tablet Take 1 tablet by mouth once daily 12/26/23   Gasper Nancyann BRAVO, MD  aspirin  EC 81 MG tablet Take 81 mg by mouth daily.    [provider]  atorvastatin  (LIPITOR) 40 MG tablet Take 1 tablet by mouth once daily 05/18/24   Fisher, Donald E, MD  carboxymethylcellulose (ARTIFICIAL TEARS) 1 % ophthalmic solution Apply 1 drop to eye 4 (four) times daily.    [provider]  Cholecalciferol (VITAMIN D3) 20 MCG TABS Take 2,000 Int'l Units by mouth daily.    [provider]  clopidogrel  (PLAVIX ) 75 MG tablet Take 1 tablet (75 mg total) by mouth daily. 05/24/24   Gasper Nancyann BRAVO, MD  Cyanocobalamin (VITAMIN B12) 1000 MCG TBCR Take 1 tablet by mouth daily.    [provider]  finasteride  (PROSCAR ) 5 MG tablet Take 1 tablet (5 mg total) by mouth daily. 01/19/24   Francisca Redell BROCKS, MD  hydrochlorothiazide  (HYDRODIURIL ) 25 MG tablet Take 1 tablet (25 mg total) by mouth daily. 07/16/24   Gasper Nancyann BRAVO, MD  levothyroxine  (SYNTHROID ) 75 MCG tablet Take 1 tablet (75 mcg total) by mouth daily before breakfast. 12/29/23   Gasper Nancyann BRAVO, MD  losartan  (COZAAR ) 50 MG tablet Take 1.5 tablets (75 mg total) by mouth daily. 05/31/24   Gasper Nancyann BRAVO, MD  metFORMIN  (GLUCOPHAGE -XR) 500 MG 24 hr tablet Take 1 tablet by mouth once daily with breakfast 01/31/24   Gasper Nancyann BRAVO, MD  omega-3 acid ethyl esters (LOVAZA) 1 g capsule Take 2 g by mouth 2 (two) times daily. Patient not taking: Reported on 05/24/2024    [provider]  polyethylene glycol powder (GLYCOLAX/MIRALAX) 17 GM/SCOOP powder Take 17 g by mouth daily as needed for mild constipation.    [provider]  tamsulosin  (FLOMAX ) 0.4 MG CAPS capsule Take 1 capsule (0.4 mg total) by mouth daily after supper.  01/19/24   Francisca Redell BROCKS, MD  traZODone  (DESYREL ) 100 MG tablet TAKE 1 TABLET BY MOUTH AT BEDTIME . APPOINTMENT REQUIRED FOR FUTURE REFILLS 07/10/24   Gasper Nancyann BRAVO, MD  triamcinolone  cream (KENALOG ) 0.1 % Apply 1 Application topically 2 (two) times daily. Patient not taking: Reported on 05/24/2024 09/11/23   [provider]    Family History Family History  Problem Relation Age of Onset   Alzheimer's disease Mother    CAD Father    Diabetes Sister        type 2    Social History Social History   Tobacco Use   Smoking status: Never    Passive exposure: Never   Smokeless tobacco: Never   Tobacco comments:    Smoked a  little as a teenager  Vaping Use   Vaping status: Never Used  Substance Use Topics   Alcohol use: No    Alcohol/week: 0.0 standard drinks of alcohol   Drug use: No     Allergies   Patient has no known allergies.   Review of Systems Review of Systems  Cardiovascular:  Positive for chest pain.   Per HPI  Physical Exam Triage Vital Signs ED Triage Vitals  Encounter Vitals Group     BP 07/20/24 1510 134/79     Girls Systolic BP Percentile --      Girls Diastolic BP Percentile --      Boys Systolic BP Percentile --      Boys Diastolic BP Percentile --      Pulse Rate 07/20/24 1510 96     Resp 07/20/24 1510 16     Temp 07/20/24 1510 98.1 F (36.7 C)     Temp Source 07/20/24 1510 Oral     SpO2 07/20/24 1510 95 %     Weight --      Height --      Head Circumference --      Peak Flow --      Pain Score 07/20/24 1508 3     Pain Loc --      Pain Education --      Exclude from Growth Chart --    No data found.  Updated Vital Signs BP 134/79 (BP Location: Right Arm)   Pulse 96   Temp 98.1 F (36.7 C) (Oral)   Resp 16   SpO2 95%   Visual Acuity Right Eye Distance:   Left Eye Distance:   Bilateral Distance:    Right Eye Near:   Left Eye Near:    Bilateral Near:     Physical Exam Vitals and nursing note reviewed.   Constitutional:      General: He is awake. He is not in acute distress.    Appearance: Normal appearance. He is well-developed and well-groomed. He is not ill-appearing.  Chest:     Chest wall: Tenderness present.       Comments: Tenderness and yellowish bruising noted to right chest wall Musculoskeletal:     Cervical back: Normal.     Thoracic back: Normal. No swelling, edema, deformity, signs of trauma, tenderness or bony tenderness. Normal range of motion.     Lumbar back: Normal.  Skin:    General: Skin is warm and dry.     Findings: Ecchymosis present.  Neurological:     Mental Status: He is alert.  Psychiatric:        Behavior: Behavior is cooperative.      UC Treatments / Results  Labs (all labs ordered are listed, but only abnormal results are displayed) Labs Reviewed - No data to display  EKG   Radiology DG Ribs Unilateral W/Chest Right Result Date: 07/20/2024 EXAM: AP AND LATERAL VIEW(S) XRAY OF THE RIGHT RIBS AND CHEST 07/20/2024 03:50:40 PM COMPARISON: None available. CLINICAL HISTORY: Chest wall pain after fall 2 weeks ago Chest wall pain after fall 2 weeks ago FINDINGS: BONES: No acute displaced rib fracture. LUNGS AND PLEURA: No consolidation or pulmonary edema. No pleural effusion or pneumothorax. HEART AND MEDIASTINUM: No acute abnormality of the cardiac and mediastinal silhouettes. IMPRESSION: 1. No acute rib fracture. 2. No acute process in the lungs. Electronically signed by: Lynwood Seip MD 07/20/2024 04:09 PM EST RP Workstation: [id number]    Procedures Procedures (including critical care time)  Medications Ordered in UC Medications - No data to display  Initial Impression / Assessment and Plan / UC Course  I have reviewed the triage vital signs and the nursing notes.  Pertinent labs & imaging results that were available during my care of the patient were reviewed by me and considered in my medical decision making (see chart for details).      Patient is overall well-appearing.  Vitals are stable.  Rib x-ray ordered to rule out any underlying rib fractures.  I dependently interpreted these images and there were no acute rib fractures or other acute process in the lungs.  Radiology report confirms this.  EKG reveals sinus rhythm with first-degree AV block which is consistent with previous EKGs.  No acute cardiac findings at this time.  Prescribed a few tramadol as needed for severe pain.  Recommended Tylenol  as needed for pain as well.  Discussed follow-up, return, and strict ER precautions. Final Clinical Impressions(s) / UC Diagnoses   Final diagnoses:  Acute chest wall pain  Fall, initial encounter     Discharge Instructions      Your chest x-ray did not reveal any underlying rib fractures or obvious injury to your lungs. I have prescribed a few tablets of tramadol that you can take every 6 hours as needed for severe pain. Otherwise you can continue taking 500 to 1000 mg of Tylenol  every 6-8 hours as needed for pain.  Do not exceed 4000 mg in 1 day. You also can alternate between ice and heat as needed for pain. Follow-up with your primary care provider or return here as needed. If you develop worsening chest pain, shortness of breath, pain with deep breathing, weakness, or passing out please seek immediate medical treatment in the emergency department.     ED Prescriptions     Medication Sig Dispense Auth. Provider   traMADol (ULTRAM) 50 MG tablet Take 1 tablet (50 mg total) by mouth every 6 (six) hours as needed for severe pain (pain score 7-10). 10 tablet Johnie Flaming A, NP      I have reviewed the PDMP during this encounter.   Johnie Flaming A, NP 07/20/24 1640

## 2024-07-20 NOTE — ED Triage Notes (Signed)
 Patient presents to UC for right sided chest pain and mid back pain x 4-5 days ago. He states he fell 2 weeks ago. Pain with ROM. Treating pain with tylenol . Last dose this morning with no relief. He spoke with triage nurse who instructed him to come in for evaluation.

## 2024-07-20 NOTE — Discharge Instructions (Addendum)
 Your chest x-ray did not reveal any underlying rib fractures or obvious injury to your lungs. I have prescribed a few tablets of tramadol that you can take every 6 hours as needed for severe pain. Otherwise you can continue taking 500 to 1000 mg of Tylenol  every 6-8 hours as needed for pain.  Do not exceed 4000 mg in 1 day. You also can alternate between ice and heat as needed for pain. Follow-up with your primary care provider or return here as needed. If you develop worsening chest pain, shortness of breath, pain with deep breathing, weakness, or passing out please seek immediate medical treatment in the emergency department.

## 2024-07-24 ENCOUNTER — Telehealth: Payer: Self-pay | Admitting: Family Medicine

## 2024-07-24 NOTE — Telephone Encounter (Signed)
[facility] faxed refill request for the following medications:  hydrochlorothiazide (HYDRODIURIL) 25 MG tablet    Please advise.  

## 2024-07-25 NOTE — Telephone Encounter (Signed)
 Sent in on 07/16/24 with refills

## 2024-07-27 ENCOUNTER — Other Ambulatory Visit (INDEPENDENT_AMBULATORY_CARE_PROVIDER_SITE_OTHER): Payer: Self-pay | Admitting: Vascular Surgery

## 2024-07-27 DIAGNOSIS — I6523 Occlusion and stenosis of bilateral carotid arteries: Secondary | ICD-10-CM

## 2024-07-31 ENCOUNTER — Encounter (INDEPENDENT_AMBULATORY_CARE_PROVIDER_SITE_OTHER): Payer: Self-pay | Admitting: Vascular Surgery

## 2024-07-31 ENCOUNTER — Ambulatory Visit (INDEPENDENT_AMBULATORY_CARE_PROVIDER_SITE_OTHER): Payer: Medicare Other

## 2024-07-31 ENCOUNTER — Ambulatory Visit (INDEPENDENT_AMBULATORY_CARE_PROVIDER_SITE_OTHER): Payer: Medicare Other | Admitting: Vascular Surgery

## 2024-07-31 VITALS — BP 147/79 | HR 70 | Resp 17 | Ht 69.0 in | Wt 189.8 lb

## 2024-07-31 DIAGNOSIS — K551 Chronic vascular disorders of intestine: Secondary | ICD-10-CM

## 2024-07-31 DIAGNOSIS — I6523 Occlusion and stenosis of bilateral carotid arteries: Secondary | ICD-10-CM | POA: Diagnosis not present

## 2024-07-31 DIAGNOSIS — I701 Atherosclerosis of renal artery: Secondary | ICD-10-CM | POA: Diagnosis not present

## 2024-07-31 DIAGNOSIS — E782 Mixed hyperlipidemia: Secondary | ICD-10-CM

## 2024-07-31 DIAGNOSIS — I10 Essential (primary) hypertension: Secondary | ICD-10-CM

## 2024-07-31 DIAGNOSIS — I6521 Occlusion and stenosis of right carotid artery: Secondary | ICD-10-CM | POA: Diagnosis not present

## 2024-08-12 ENCOUNTER — Encounter (INDEPENDENT_AMBULATORY_CARE_PROVIDER_SITE_OTHER): Payer: Self-pay | Admitting: Vascular Surgery

## 2024-08-12 MED ORDER — CLOPIDOGREL BISULFATE 75 MG PO TABS: 75.0000 mg | ORAL_TABLET | Freq: Every day | ORAL | 3 refills | Status: AC

## 2024-08-12 NOTE — Progress Notes (Signed)
 MRN : [id number]  [facility] Ernest James. is a 88 y.o. (30-Sep-1935) male who presents with chief complaint of check circulation.  History of Present Illness:   The patient is seen for follow up evaluation of carotid stenosis status post placement of a 10 x 8 x 40 exact stent with the use of the NAV-6 embolic protection device in the left carotid artery on 04/14/2022.     There were no post operative problems or complications related to the surgery.  The patient denies neck or incisional pain.   The patient denies interval amaurosis fugax. There is no recent history of TIA symptoms or focal motor deficits. There is no prior documented CVA.   The patient denies headache.   The patient is taking enteric-coated aspirin  81 mg daily with Plavix  75 mg p.o. daily.   Patient reports his blood pressure continues to be stable.  No abrupt spikes or episodes of hypertensive crisis.   Patient denies abdominal pain postprandially no weight loss.   No recent shortening of the patient's walking distance or new symptoms consistent with claudication.  No history of rest pain symptoms. No new ulcers or wounds of the lower extremities have occurred.   There is no history of DVT, PE or superficial thrombophlebitis. No recent episodes of angina or shortness of breath documented.   Duplex ultrasound of carotid arteries obtained today demonstrates 1 to 39% RICA stenosis and 40-59% stenosis of the LICA s/p stent placement.    Current Meds  Medication Sig   acetaminophen  (TYLENOL ) 650 MG CR tablet Take 650 mg by mouth as needed for pain.   amLODipine  (NORVASC ) 5 MG tablet Take 1 tablet by mouth once daily   aspirin  EC 81 MG tablet Take 81 mg by mouth daily.   atorvastatin  (LIPITOR) 40 MG tablet Take 1 tablet by mouth once daily   carboxymethylcellulose (ARTIFICIAL TEARS) 1 % ophthalmic solution Apply 1 drop to eye 4 (four) times  daily.   Cholecalciferol (VITAMIN D3) 20 MCG TABS Take 2,000 Int'l Units by mouth daily.   Cyanocobalamin (VITAMIN B12) 1000 MCG TBCR Take 1 tablet by mouth daily.   finasteride  (PROSCAR ) 5 MG tablet Take 1 tablet (5 mg total) by mouth daily.   hydrochlorothiazide  (HYDRODIURIL ) 25 MG tablet Take 1 tablet (25 mg total) by mouth daily.   levothyroxine  (SYNTHROID ) 75 MCG tablet Take 1 tablet (75 mcg total) by mouth daily before breakfast.   losartan  (COZAAR ) 50 MG tablet Take 1.5 tablets (75 mg total) by mouth daily.   metFORMIN  (GLUCOPHAGE -XR) 500 MG 24 hr tablet Take 1 tablet by mouth once daily with breakfast   polyethylene glycol powder (GLYCOLAX/MIRALAX) 17 GM/SCOOP powder Take 17 g by mouth daily as needed for mild constipation.   tamsulosin  (FLOMAX ) 0.4 MG CAPS capsule Take 1 capsule (0.4 mg total) by mouth daily after supper.   traMADol  (ULTRAM ) 50 MG tablet Take 1 tablet (50 mg total) by mouth every 6 (six) hours as needed for severe pain (pain score 7-10).   traZODone  (DESYREL ) 100 MG tablet TAKE 1 TABLET BY MOUTH AT BEDTIME . APPOINTMENT  REQUIRED FOR FUTURE REFILLS   [DISCONTINUED] clopidogrel  (PLAVIX ) 75 MG tablet Take 1 tablet (75 mg total) by mouth daily.    Past Medical History:  Diagnosis Date   Arthritis    CAD (coronary artery disease)    Carotid artery stenosis    Celiac artery stenosis    COVID-19 03/2023   GERD (gastroesophageal reflux disease)    Grade I diastolic dysfunction    Hypertension    Hypothyroidism    Mild cognitive impairment    Mild cognitive impairment    Mild mitral regurgitation by prior echocardiogram    Phimosis    Pre-diabetes    Repeated falls    no injuries   Sensorineural hearing loss, bilateral    Superior mesenteric artery stenosis    TIA (transient ischemic attack) 08/2022   per neurology - Possible TIA   Wears hearing aid in both ears     Past Surgical History:  Procedure Laterality Date   CAROTID PTA/STENT INTERVENTION Left  04/14/2022   Procedure: CAROTID PTA/STENT INTERVENTION;  Surgeon: Jama Cordella MATSU, MD;  Location: [facility] INVASIVE CV LAB;  Service: Cardiovascular;  Laterality: Left;   CATARACT EXTRACTION W/PHACO Left 07/27/2023   Procedure: CATARACT EXTRACTION PHACO AND INTRAOCULAR LENS PLACEMENT (IOC) LEFT DIABETIC  6.83  00:46.8;  Surgeon: Jaye Fallow, MD;  Location: [facility];  Service: Ophthalmology;  Laterality: Left;   CATARACT EXTRACTION W/PHACO Right 08/10/2023   Procedure: CATARACT EXTRACTION PHACO AND INTRAOCULAR LENS PLACEMENT (IOC) RIGHT  DIABETIC malyugin 8.13 00:58.0;  Surgeon: Jaye Fallow, MD;  Location: [facility];  Service: Ophthalmology;  Laterality: Right;   CIRCUMCISION     COLONOSCOPY WITH PROPOFOL  N/A 03/31/2016   Procedure: COLONOSCOPY WITH PROPOFOL ;  Surgeon: Gladis RAYMOND Mariner, MD;  Location: [facility] ENDOSCOPY;  Service: Endoscopy;  Laterality: N/A;   MRI     SINUS SURGERY  1994   on left side   TONSILLECTOMY      Social History Social History   Tobacco Use   Smoking status: Never    Passive exposure: Never   Smokeless tobacco: Never   Tobacco comments:    Smoked a little as a teenager  Vaping Use   Vaping status: Never Used  Substance Use Topics   Alcohol use: No    Alcohol/week: 0.0 standard drinks of alcohol   Drug use: No    Family History Family History  Problem Relation Age of Onset   Alzheimer's disease Mother    CAD Father    Diabetes Sister        type 2    No Known Allergies   REVIEW OF SYSTEMS (Negative unless checked)  Constitutional: [] Weight loss  [] Fever  [] Chills Cardiac: [] Chest pain   [] Chest pressure   [] Palpitations   [] Shortness of breath when laying flat   [] Shortness of breath with exertion. Vascular:  [x] Pain in legs with walking   [] Pain in legs at rest  [] History of DVT   [] Phlebitis   [] Swelling in legs   [] Varicose veins   [] Non-healing ulcers Pulmonary:   [] Uses home oxygen   [] Productive cough    [] Hemoptysis   [] Wheeze  [] COPD   [] Asthma Neurologic:  [] Dizziness   [] Seizures   [] History of stroke   [] History of TIA  [] Aphasia   [] Vissual changes   [] Weakness or numbness in arm   [] Weakness or numbness in leg Musculoskeletal:   [] Joint swelling   [] Joint pain   [] Low back pain Hematologic:  [] Easy bruising  [] Easy bleeding   []   Hypercoagulable state   [] Anemic Gastrointestinal:  [] Diarrhea   [] Vomiting  [] Gastroesophageal reflux/heartburn   [] Difficulty swallowing. Genitourinary:  [] Chronic kidney disease   [] Difficult urination  [] Frequent urination   [] Blood in urine Skin:  [] Rashes   [] Ulcers  Psychological:  [] History of anxiety   []  History of major depression.  Physical Examination  Vitals:   07/31/24 1349  BP: (!) 147/79  Pulse: 70  Resp: 17  Weight: 189 lb 12.8 oz (86.1 kg)  Height: 5' 9 (1.753 m)   Body mass index is 28.03 kg/m. Gen: WD/WN, NAD Head: [place]/AT, No temporalis wasting.  Ear/Nose/Throat: Hearing grossly intact, nares w/o erythema or drainage Eyes: PER, EOMI, sclera nonicteric.  Neck: Supple, no masses.  No bruit or JVD.  Pulmonary:  Good air movement, no audible wheezing, no use of accessory muscles.  Cardiac: RRR, normal S1, S2, no Murmurs. Vascular:  mild trophic changes, no open wounds Vessel Right Left  Radial Palpable Palpable  Gastrointestinal: soft, non-distended. No guarding/no peritoneal signs.  Musculoskeletal: M/S 5/5 throughout.  No visible deformity.  Neurologic: CN 2-12 intact. Pain and light touch intact in extremities.  Symmetrical.  Speech is fluent. Motor exam as listed above. Psychiatric: Judgment intact, Mood & affect appropriate for pt's clinical situation. Dermatologic: No rashes or ulcers noted.  No changes consistent with cellulitis.   CBC Lab Results  Component Value Date   WBC 8.2 05/24/2024   HGB 14.3 05/24/2024   HCT 41.5 05/24/2024   MCV 94 05/24/2024   PLT 306 05/24/2024    BMET    Component Value Date/Time    NA 138 05/24/2024 1517   K 3.9 05/24/2024 1517   CL 99 05/24/2024 1517   CO2 25 05/24/2024 1517   GLUCOSE 121 (H) 05/24/2024 1517   GLUCOSE 137 (H) 05/06/2022 1208   BUN 18 05/24/2024 1517   CREATININE 0.90 05/24/2024 1517   CALCIUM  10.5 (H) 05/24/2024 1517   GFRNONAA >60 05/06/2022 1208   GFRAA >60 12/23/2019 1352   CrCl cannot be calculated (Patient's most recent lab result is older than the maximum 21 days allowed.).  COAG No results found for: INR, PROTIME  Radiology VAS US  CAROTID Result Date: 07/31/2024 Carotid Arterial Duplex Study Patient Name:  [facility] Ernest Raddle.  Date of Exam:   07/31/2024 Medical Rec #: [id number]                      Accession #:    [id number] Date of Birth: 01-21-1936                      Patient Gender: M Patient Age:   89 years Exam Location:  [facility] Procedure:      VAS US  CAROTID Referring Phys: CORDELLA SHAWL --------------------------------------------------------------------------------  Indications:   Carotid artery disease. Risk Factors:  Hypertension, Diabetes, no history of smoking. Other Factors: Left carotid stent on 04/14/12. Performing Technologist: Donnice Charnley RVT  Examination Guidelines: A complete evaluation includes B-mode imaging, spectral Doppler, color Doppler, and power Doppler as needed of all accessible portions of each vessel. Bilateral testing is considered an integral part of a complete examination. Limited examinations for reoccurring indications may be performed as noted.  Right Carotid Findings: +----------+--------+--------+--------+----------------------+--------+           PSV cm/sEDV cm/sStenosisPlaque Description    Comments +----------+--------+--------+--------+----------------------+--------+ CCA Prox  86      0                                              +----------+--------+--------+--------+----------------------+--------+  CCA Mid   132     0                                               +----------+--------+--------+--------+----------------------+--------+ CCA Distal114     10                                             +----------+--------+--------+--------+----------------------+--------+ ICA Prox  172     15      1-39%   irregular and calcific         +----------+--------+--------+--------+----------------------+--------+ ICA Mid   62      13                                             +----------+--------+--------+--------+----------------------+--------+ ICA Distal83      15                                             +----------+--------+--------+--------+----------------------+--------+ ECA       178     0                                              +----------+--------+--------+--------+----------------------+--------+ +----------+--------+-------+----------------+-------------------+           PSV cm/sEDV cmsDescribe        Arm Pressure (mmHG) +----------+--------+-------+----------------+-------------------+ [id number]            Multiphasic, WNL                    +----------+--------+-------+----------------+-------------------+ +---------+--------+--+--------+-+---------+ VertebralPSV cm/s47EDV cm/s0Antegrade +---------+--------+--+--------+-+---------+  Left Carotid Findings: +----------+--------+--------+--------+-------------------+-------------+           PSV cm/sEDV cm/sStenosisPlaque Description Comments      +----------+--------+--------+--------+-------------------+-------------+ CCA Prox  68      12      <50%    calcific and smooth              +----------+--------+--------+--------+-------------------+-------------+ CCA Mid   117     15      <50%    calcific and smooth              +----------+--------+--------+--------+-------------------+-------------+ CCA Distal104     14                                 stent          +----------+--------+--------+--------+-------------------+-------------+ ICA Prox  213     28      1-39%                      stent         +----------+--------+--------+--------+-------------------+-------------+ ICA Mid   113     16                                               +----------+--------+--------+--------+-------------------+-------------+  ICA Distal88      15                                               +----------+--------+--------+--------+-------------------+-------------+ ECA       634     0       >50%                       through stent +----------+--------+--------+--------+-------------------+-------------+ +----------+--------+--------+----------------+-------------------+           PSV cm/sEDV cm/sDescribe        Arm Pressure (mmHG) +----------+--------+--------+----------------+-------------------+ Subclavian119             Multiphasic, WNL                    +----------+--------+--------+----------------+-------------------+ +---------+--------+--+--------+-+---------+ VertebralPSV cm/s42EDV cm/s9Antegrade +---------+--------+--+--------+-+---------+  Left Stent(s): +---------------+--------+--------+---------------+--------+--------+ CCA to ICA     PSV cm/sEDV cm/sStenosis       WaveformComments +---------------+--------+--------+---------------+--------+--------+ Prox to Stent  117     16                                      +---------------+--------+--------+---------------+--------+--------+ Proximal Stent 102     15                                      +---------------+--------+--------+---------------+--------+--------+ Mid Stent      267     24      50-75% stenosis                 +---------------+--------+--------+---------------+--------+--------+ Distal Stent   199     25                                      +---------------+--------+--------+---------------+--------+--------+ Distal to [id number]      19                                      +---------------+--------+--------+---------------+--------+--------+    Summary: Right Carotid: Velocities in the right ICA are consistent with a 1-39% stenosis. Left Carotid: Velocities in the left ICA are consistent with a 40-59% stenosis.               Non-hemodynamically significant plaque <50% noted in the CCA. The               ECA appears >50% stenosed. >50% mid ICA stent stenosis. Vertebrals:  Bilateral vertebral arteries demonstrate antegrade flow. Subclavians: Normal flow hemodynamics were seen in bilateral subclavian              arteries. *See table(s) above for measurements and observations.  Electronically signed by Cordella Shawl MD on 07/31/2024 at 4:44:47 PM.    Final    DG Ribs Unilateral W/Chest Right Result Date: 07/20/2024 EXAM: AP AND LATERAL VIEW(S) XRAY OF THE RIGHT RIBS AND CHEST 07/20/2024 03:50:40 PM COMPARISON: None available. CLINICAL HISTORY: Chest wall pain after fall 2 weeks ago Chest wall pain after fall 2 weeks ago FINDINGS: BONES: No acute displaced rib fracture. LUNGS AND PLEURA:  No consolidation or pulmonary edema. No pleural effusion or pneumothorax. HEART AND MEDIASTINUM: No acute abnormality of the cardiac and mediastinal silhouettes. IMPRESSION: 1. No acute rib fracture. 2. No acute process in the lungs. Electronically signed by: Lynwood Seip MD 07/20/2024 04:09 PM EST RP Workstation: [id number]     Assessment/Plan 1. Bilateral carotid artery stenosis (Primary) Recommend:   The patient is s/p successful left carotid stent on 04/14/2022.   Duplex ultrasound of carotid arteries obtained today demonstrates 1 to 39% RICA stenosis and 40-59% stenosis of the LICA s/p stent placement.   Continue antiplatelet therapy as prescribed Continue management of CAD, HTN and Hyperlipidemia Healthy heart diet,  encouraged exercise at least 4 times per week   Follow up in 12 months with duplex ultrasound and physical exam.  -  VAS US  CAROTID; Future  2. Renal artery stenosis Recommend:   Given patient's arterial disease optimal control of the patient's hypertension is important. BP is acceptable today   The patient's vital signs and noninvasive studies support the renal artery stenosis is not significantly increased when compared to the previous study.   No invasive studies or intervention is indicated at this time.   The patient will continue the current antihypertensive medications, no changes at this time.   The primary medical service will continue aggressive antihypertensive therapy as per the AHA guidelines   3. Chronic mesenteric ischemia Recommend:   Given patient's arterial disease optimal control of the patient's hypertension is important. BP is acceptable today   The patient's vital signs and noninvasive studies support the renal artery stenosis is not significantly increased when compared to the previous study.   No invasive studies or intervention is indicated at this time.   The patient will continue the current antihypertensive medications, no changes at this time.   The primary medical service will continue aggressive antihypertensive therapy as per the AHA guidelines   4. Primary hypertension Continue antihypertensive medications as already ordered, these medications have been reviewed and there are no changes at this time.  5. Mixed hyperlipidemia Continue statin as ordered and reviewed, no changes at this time  6. Carotid stenosis, symptomatic w/o infarct, right Recommend:   The patient is s/p successful left carotid stent on 04/14/2022.   Duplex ultrasound of carotid arteries obtained today demonstrates 1 to 39% RICA stenosis and 40-59% stenosis of the LICA s/p stent placement.   Continue antiplatelet therapy as prescribed Continue management of CAD, HTN and Hyperlipidemia Healthy heart diet,  encouraged exercise at least 4 times per week   Follow up in 12 months with duplex  ultrasound and physical exam. - clopidogrel  (PLAVIX ) 75 MG tablet; Take 1 tablet (75 mg total) by mouth daily.  Dispense: 90 tablet; Refill: 3    Cordella Shawl, MD  08/12/2024 11:20 AM

## 2024-08-17 DIAGNOSIS — Z23 Encounter for immunization: Secondary | ICD-10-CM | POA: Diagnosis not present

## 2024-08-26 ENCOUNTER — Other Ambulatory Visit: Payer: Self-pay | Admitting: Family Medicine

## 2024-08-26 DIAGNOSIS — E782 Mixed hyperlipidemia: Secondary | ICD-10-CM

## 2024-09-28 ENCOUNTER — Other Ambulatory Visit: Payer: Self-pay | Admitting: Family Medicine

## 2024-09-28 DIAGNOSIS — I10 Essential (primary) hypertension: Secondary | ICD-10-CM

## 2024-11-22 ENCOUNTER — Ambulatory Visit: Admitting: Family Medicine

## 2025-01-16 ENCOUNTER — Ambulatory Visit: Admitting: Urology

## 2025-01-31 ENCOUNTER — Ambulatory Visit

## 2025-07-30 ENCOUNTER — Ambulatory Visit (INDEPENDENT_AMBULATORY_CARE_PROVIDER_SITE_OTHER): Admitting: Vascular Surgery

## 2025-07-30 ENCOUNTER — Encounter (INDEPENDENT_AMBULATORY_CARE_PROVIDER_SITE_OTHER)
